# Patient Record
Sex: Female | Born: 1950 | Race: White | Hispanic: No | Marital: Married | Smoking: Never smoker
Health system: Southern US, Community
[De-identification: ages and names within clinical notes are randomized; demographics above are authoritative.]

## PROBLEM LIST (undated history)

## (undated) DIAGNOSIS — S149XXA Injury of unspecified nerves of neck, initial encounter: Secondary | ICD-10-CM

## (undated) DIAGNOSIS — Z8051 Family history of malignant neoplasm of kidney: Secondary | ICD-10-CM

## (undated) DIAGNOSIS — Z803 Family history of malignant neoplasm of breast: Secondary | ICD-10-CM

## (undated) DIAGNOSIS — Z801 Family history of malignant neoplasm of trachea, bronchus and lung: Secondary | ICD-10-CM

## (undated) DIAGNOSIS — C801 Malignant (primary) neoplasm, unspecified: Secondary | ICD-10-CM

## (undated) DIAGNOSIS — I719 Aortic aneurysm of unspecified site, without rupture: Secondary | ICD-10-CM

## (undated) DIAGNOSIS — N950 Postmenopausal bleeding: Secondary | ICD-10-CM

## (undated) DIAGNOSIS — A64 Unspecified sexually transmitted disease: Secondary | ICD-10-CM

## (undated) DIAGNOSIS — A6920 Lyme disease, unspecified: Secondary | ICD-10-CM

## (undated) DIAGNOSIS — I10 Essential (primary) hypertension: Secondary | ICD-10-CM

## (undated) DIAGNOSIS — M810 Age-related osteoporosis without current pathological fracture: Secondary | ICD-10-CM

## (undated) DIAGNOSIS — J189 Pneumonia, unspecified organism: Secondary | ICD-10-CM

## (undated) DIAGNOSIS — S3992XA Unspecified injury of lower back, initial encounter: Secondary | ICD-10-CM

## (undated) DIAGNOSIS — M1990 Unspecified osteoarthritis, unspecified site: Secondary | ICD-10-CM

## (undated) DIAGNOSIS — R32 Unspecified urinary incontinence: Secondary | ICD-10-CM

## (undated) DIAGNOSIS — N809 Endometriosis, unspecified: Secondary | ICD-10-CM

## (undated) HISTORY — DX: Lyme disease, unspecified: A69.20

## (undated) HISTORY — DX: Unspecified urinary incontinence: R32

## (undated) HISTORY — DX: Family history of malignant neoplasm of kidney: Z80.51

## (undated) HISTORY — DX: Malignant (primary) neoplasm, unspecified: C80.1

## (undated) HISTORY — DX: Age-related osteoporosis without current pathological fracture: M81.0

## (undated) HISTORY — DX: Unspecified injury of lower back, initial encounter: S39.92XA

## (undated) HISTORY — DX: Injury of unspecified nerves of neck, initial encounter: S14.9XXA

## (undated) HISTORY — PX: CATARACT EXTRACTION, BILATERAL: SHX1313

## (undated) HISTORY — PX: LASIK: SHX215

## (undated) HISTORY — DX: Unspecified sexually transmitted disease: A64

## (undated) HISTORY — PX: EYE SURGERY: SHX253

## (undated) HISTORY — DX: Family history of malignant neoplasm of trachea, bronchus and lung: Z80.1

## (undated) HISTORY — DX: Postmenopausal bleeding: N95.0

## (undated) HISTORY — DX: Family history of malignant neoplasm of breast: Z80.3

## (undated) HISTORY — DX: Endometriosis, unspecified: N80.9

---

## 1984-01-25 HISTORY — PX: PELVIC LAPAROSCOPY: SHX162

## 1984-12-24 HISTORY — PX: OOPHORECTOMY: SHX86

## 1992-01-25 DIAGNOSIS — S3992XA Unspecified injury of lower back, initial encounter: Secondary | ICD-10-CM

## 1992-01-25 HISTORY — DX: Unspecified injury of lower back, initial encounter: S39.92XA

## 1997-07-18 ENCOUNTER — Other Ambulatory Visit: Admission: RE | Admit: 1997-07-18 | Discharge: 1997-07-18 | Payer: Self-pay | Admitting: Obstetrics and Gynecology

## 1998-04-15 ENCOUNTER — Emergency Department (HOSPITAL_COMMUNITY): Admission: EM | Admit: 1998-04-15 | Discharge: 1998-04-15 | Payer: Self-pay | Admitting: Emergency Medicine

## 1998-04-15 ENCOUNTER — Encounter: Payer: Self-pay | Admitting: Emergency Medicine

## 1998-04-16 ENCOUNTER — Encounter: Payer: Self-pay | Admitting: Emergency Medicine

## 1998-10-07 ENCOUNTER — Other Ambulatory Visit: Admission: RE | Admit: 1998-10-07 | Discharge: 1998-10-07 | Payer: Self-pay | Admitting: Obstetrics and Gynecology

## 1999-11-10 ENCOUNTER — Other Ambulatory Visit: Admission: RE | Admit: 1999-11-10 | Discharge: 1999-11-10 | Payer: Self-pay | Admitting: Obstetrics and Gynecology

## 2000-09-05 ENCOUNTER — Emergency Department (HOSPITAL_COMMUNITY): Admission: EM | Admit: 2000-09-05 | Discharge: 2000-09-05 | Payer: Self-pay

## 2000-10-06 ENCOUNTER — Encounter: Admission: RE | Admit: 2000-10-06 | Discharge: 2000-10-06 | Payer: Self-pay | Admitting: Geriatric Medicine

## 2000-10-06 ENCOUNTER — Encounter: Payer: Self-pay | Admitting: Geriatric Medicine

## 2000-11-10 ENCOUNTER — Other Ambulatory Visit: Admission: RE | Admit: 2000-11-10 | Discharge: 2000-11-10 | Payer: Self-pay | Admitting: Obstetrics and Gynecology

## 2001-11-12 ENCOUNTER — Other Ambulatory Visit: Admission: RE | Admit: 2001-11-12 | Discharge: 2001-11-12 | Payer: Self-pay | Admitting: Obstetrics and Gynecology

## 2002-12-11 ENCOUNTER — Other Ambulatory Visit: Admission: RE | Admit: 2002-12-11 | Discharge: 2002-12-11 | Payer: Self-pay | Admitting: Obstetrics and Gynecology

## 2004-01-20 ENCOUNTER — Other Ambulatory Visit: Admission: RE | Admit: 2004-01-20 | Discharge: 2004-01-20 | Payer: Self-pay | Admitting: Obstetrics and Gynecology

## 2004-01-25 LAB — HM COLONOSCOPY

## 2005-10-21 ENCOUNTER — Other Ambulatory Visit: Admission: RE | Admit: 2005-10-21 | Discharge: 2005-10-21 | Payer: Self-pay | Admitting: Obstetrics and Gynecology

## 2007-01-17 ENCOUNTER — Other Ambulatory Visit: Admission: RE | Admit: 2007-01-17 | Discharge: 2007-01-17 | Payer: Self-pay | Admitting: Obstetrics and Gynecology

## 2008-04-16 ENCOUNTER — Other Ambulatory Visit: Admission: RE | Admit: 2008-04-16 | Discharge: 2008-04-16 | Payer: Self-pay | Admitting: Obstetrics and Gynecology

## 2008-07-23 HISTORY — PX: BREAST SURGERY: SHX581

## 2008-12-02 ENCOUNTER — Encounter: Payer: Self-pay | Admitting: Sports Medicine

## 2008-12-25 ENCOUNTER — Ambulatory Visit: Payer: Self-pay | Admitting: Sports Medicine

## 2008-12-25 DIAGNOSIS — M25569 Pain in unspecified knee: Secondary | ICD-10-CM | POA: Insufficient documentation

## 2008-12-25 DIAGNOSIS — M216X9 Other acquired deformities of unspecified foot: Secondary | ICD-10-CM | POA: Insufficient documentation

## 2009-04-22 LAB — HM PAP SMEAR: HM Pap smear: NEGATIVE

## 2012-05-04 ENCOUNTER — Telehealth: Payer: Self-pay | Admitting: Obstetrics and Gynecology

## 2012-05-04 NOTE — Telephone Encounter (Signed)
Pt would like nurse to give her a call. She is not sure if she needs an appt.

## 2012-05-04 NOTE — Telephone Encounter (Signed)
PATIENT WILL USE OTC FOR WEEK END EXCEPT ON Sunday AND WILL CALL FOR APPT. ON Monday IF NO IMPROVEMENT. SEU

## 2012-05-04 NOTE — Telephone Encounter (Signed)
PATIENT IS HAVING VAGINAL ITCHING . CONCERN IS A YEAST INFECTION. DECLINES VAGINAL DISCHARGE, BURNING. STATES HAS NOT TRIED ANY OTC MEDS. STATES SHE IS PRONE TO FUNGUS INFECTIONS. PLEASE ADVISE. SUE.

## 2012-05-04 NOTE — Telephone Encounter (Signed)
Yes, ov please

## 2012-05-21 ENCOUNTER — Other Ambulatory Visit: Payer: Self-pay | Admitting: Obstetrics and Gynecology

## 2012-05-22 NOTE — Telephone Encounter (Signed)
eScribe request for refill on PROMETRIUM Last filled - 05/16/11, #30 X 1 year Last AEX - 05/16/11 Next AEX - 06/04/12 Please advise refills.  Chart in your door.

## 2012-05-27 ENCOUNTER — Other Ambulatory Visit: Payer: Self-pay | Admitting: Obstetrics and Gynecology

## 2012-05-28 NOTE — Telephone Encounter (Signed)
eScribe request for refill on [facility] Last filled - 05/16/11, #3 X 3 Last AEX - 05/16/11 Next AEX - 06/04/12 One month supply sent to pharmacy.

## 2012-05-31 ENCOUNTER — Encounter: Payer: Self-pay | Admitting: Obstetrics and Gynecology

## 2012-06-04 ENCOUNTER — Encounter: Payer: Self-pay | Admitting: Obstetrics and Gynecology

## 2012-06-04 ENCOUNTER — Ambulatory Visit (INDEPENDENT_AMBULATORY_CARE_PROVIDER_SITE_OTHER): Payer: BC Managed Care – PPO | Admitting: Obstetrics and Gynecology

## 2012-06-04 VITALS — BP 100/56 | Ht 66.25 in | Wt 144.0 lb

## 2012-06-04 DIAGNOSIS — Z202 Contact with and (suspected) exposure to infections with a predominantly sexual mode of transmission: Secondary | ICD-10-CM

## 2012-06-04 DIAGNOSIS — Z2089 Contact with and (suspected) exposure to other communicable diseases: Secondary | ICD-10-CM

## 2012-06-04 DIAGNOSIS — N951 Menopausal and female climacteric states: Secondary | ICD-10-CM

## 2012-06-04 DIAGNOSIS — Z0000 Encounter for general adult medical examination without abnormal findings: Secondary | ICD-10-CM

## 2012-06-04 DIAGNOSIS — Z01419 Encounter for gynecological examination (general) (routine) without abnormal findings: Secondary | ICD-10-CM

## 2012-06-04 LAB — POCT URINALYSIS DIPSTICK
Bilirubin, UA: NEGATIVE
Blood, UA: NEGATIVE
Glucose, UA: NEGATIVE
Nitrite, UA: NEGATIVE

## 2012-06-04 MED ORDER — ESTRADIOL ACETATE 0.05 MG/24HR VA RING: VAGINAL_RING | VAGINAL | Status: DC

## 2012-06-04 MED ORDER — PROGESTERONE MICRONIZED 100 MG PO CAPS: ORAL_CAPSULE | ORAL | Status: DC

## 2012-06-04 MED ORDER — VITAMIN D (ERGOCALCIFEROL) 1.25 MG (50000 UNIT) PO CAPS: 50000.0000 [IU] | ORAL_CAPSULE | ORAL | Status: DC

## 2012-06-04 MED ORDER — CLOBETASOL PROPIONATE 0.05 % EX OINT: TOPICAL_OINTMENT | Freq: Two times a day (BID) | CUTANEOUS | Status: DC

## 2012-06-04 MED ORDER — ESTROGENS, CONJUGATED 0.625 MG/GM VA CREA: TOPICAL_CREAM | Freq: Every day | VAGINAL | Status: DC

## 2012-06-04 NOTE — Addendum Note (Signed)
Addended by: Alison Murray on: 06/04/2012 03:49 PM   Modules accepted: Orders

## 2012-06-04 NOTE — Progress Notes (Signed)
62 y.o.  Married  Caucasian female   G1P1 here for annual exam.  Marital discord, pt wants STD screen and test for HSV because she and husband thought between them they had HSV but neither had been tested because it didn't really make a difference between them which partner had it.  Also having some vulvar itching, used monistat 7 and a cream Dr. Venancio Poisson gave her for yeast infection on other body parts and still feels itching.  Also tried a vag moisturizer without relief.  No new sex partner.  ,   Patient's last menstrual period was 09/24/2005.          Sexually active: yes  The current method of family planning is post menopausal status.    Exercising: walking 5-7 days a week Last mammogram:  08/24/11 normal Last pap smear: 04/22/09 neg History of abnormal pap: no Smoking: no Alcohol: 1 glass of wine weekly Last colonoscopy:2006 normal repeat in 10years Last Bone Density:  01/24/10 osteopenia Last tetanus shot: Dr Pete Glatter not sure when Last cholesterol check: 2013 normal  Hgb:      13.9          Urine:neg    Health Maintenance  Topic Date Due  . Tetanus/tdap  10/14/1969  . Zostavax  10/15/2010  . Pap Smear  04/22/2012  . Influenza Vaccine  09/24/2012  . Mammogram  08/23/2013  . Colonoscopy  01/24/2014    Family History  Problem Relation Age of Onset  . Osteoporosis Paternal Grandmother   . Breast cancer Paternal Grandmother   . Hypertension Mother   . COPD Mother   . Hyperlipidemia Mother   . Cancer Father 47    kidney cancer  . Osteoporosis Maternal Grandfather     Patient Active Problem List   Diagnosis Date Noted  . 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 Osteoporosis   . Endometriosis   . Urinary incontinence   . STD (sexually transmitted disease)     HSV  . Back injury 1994    back/neck injury     Past Surgical History  Procedure Laterality Date  . Pelvic  laparoscopy  1986    endometriosis  . Lasik    . Oophorectomy  12/86    Allergies: Penicillins  Current Outpatient Prescriptions  Medication Sig Dispense Refill  . Ascorbic Acid (VITAMIN C PO) Take 500 mg by mouth 2 (two) times daily.       . B Complex Vitamins (B COMPLEX PO) Take by mouth daily.      . Calcium Carbonate-Vitamin D (CALCIUM-D PO) Take 600 mg by mouth 2 (two) times daily.      . carisoprodol (SOMA) 350 MG tablet       . FEMRING 0.05 MG/24HR RING insert 1 vaginally every 3 months  1 each  0  . glucosamine-chondroitin 500-400 MG tablet Take 1 tablet by mouth daily.      . Horsetail, Equisetum Arvense, POWD by Does not apply route.      . Multiple Vitamins-Minerals (MULTIVITAMIN WITH MINERALS) tablet Take 1 tablet by mouth daily.      . progesterone (PROMETRIUM) 100 MG capsule take 1 tablet by mouth once daily  30 capsule  11  . tretinoin microspheres (RETIN-A MICRO) 0.1 % gel       . Vitamin D, Ergocalciferol, (DRISDOL) 50000 UNITS CAPS Take 50,000 Units by mouth every 14 (fourteen) days.  No current facility-administered medications for this visit.    ROS: Pertinent items are noted in HPI.  Social Hx: Married, one daughter Bland Span a pt here Is a professor at [employer]. Marital discord;  Husband in poor mental state.  Pt wants STD screen "just in case"    Exam:    BP 100/56  Ht 5' 6.25" (1.683 m)  Wt 144 lb (65.318 kg)  BMI 23.06 kg/m2  LMP 09/24/2005  Down 8 pounds since last year.  Height stable Wt Readings from Last 3 Encounters:  06/04/12 144 lb (65.318 kg)  12/25/08 160 lb (72.576 kg)     Ht Readings from Last 3 Encounters:  06/04/12 5' 6.25" (1.683 m)  12/25/08 5\' 6"  (1.676 m)    General appearance: alert, cooperative and appears stated age Head: Normocephalic, without obvious abnormality, atraumatic Neck: no adenopathy, supple, symmetrical, trachea midline and thyroid not enlarged, symmetric, no tenderness/mass/nodules Lungs: clear to auscultation  bilaterally Breasts: Inspection negative, No nipple retraction or dimpling, No nipple discharge or bleeding, No axillary or supraclavicular adenopathy, Normal to palpation without dominant masses Heart: regular rate and rhythm Abdomen: soft, non-tender; bowel sounds normal; no masses,  no organomegaly Extremities: extremities normal, atraumatic, no cyanosis or edema Skin: Skin color, texture, turgor normal. No rashes or lesions Lymph nodes: Cervical, supraclavicular, and axillary nodes normal. No abnormal inguinal nodes palpated Neurologic: Grossly normal   Pelvic: External genitalia:  no lesions, no redness or irritation              Urethra:  normal appearing urethra with no masses, tenderness or lesions              Bartholins and Skenes: normal                 Vagina: normal appearing vagina with normal color and discharge, no lesions, no evidence of infection              Cervix: normal appearance              Pap taken: yes        Bimanual Exam:  Uterus:  uterus is normal size, shape, consistency and nontender                                      Adnexa: normal adnexa in size, nontender and no masses                                      Rectovaginal: Confirms                                      Anus:  normal sphincter tone, no lesions  A: normal menopausal exam, on HRT     H/o osteoporosis     Vulvar itching, probably dermatitis     Vag dryness, wants tx     Pt requests STD check     H/o endometiosis on laparoscopy in 1986     P: mammogram pap smear counseled on breast self exam, mammography screening, osteoporosis, adequate intake of calcium and vitamin D, diet and exercise return annually or prn     An After Visit Summary was printed and given to the patient.

## 2012-06-04 NOTE — Patient Instructions (Signed)

## 2012-06-04 NOTE — Addendum Note (Signed)
Addended by: Alison Murray on: 06/04/2012 02:53 PM   Modules accepted: Level of Service

## 2012-06-05 LAB — HEMOGLOBIN, FINGERSTICK: Hemoglobin, fingerstick: 13.9 g/dL (ref 12.0–16.0)

## 2012-06-05 LAB — STD PANEL: HIV: NONREACTIVE

## 2012-06-05 LAB — HSV(HERPES SIMPLEX VRS) I + II AB-IGG
HSV 1 Glycoprotein G Ab, IgG: <0.1 IV
HSV 2 Glycoprotein G Ab, IgG: 8.36 IV — ABNORMAL HIGH

## 2012-06-05 LAB — GC/CHLAMYDIA PROBE AMP, URINE: Chlamydia, Swab/Urine, PCR: NEGATIVE

## 2012-06-06 LAB — IPS PAP TEST WITH HPV

## 2012-07-09 ENCOUNTER — Other Ambulatory Visit: Payer: Self-pay | Admitting: Obstetrics and Gynecology

## 2012-10-09 ENCOUNTER — Other Ambulatory Visit: Payer: Self-pay | Admitting: Obstetrics and Gynecology

## 2012-10-09 ENCOUNTER — Telehealth: Payer: Self-pay | Admitting: Obstetrics and Gynecology

## 2012-10-09 NOTE — Telephone Encounter (Signed)
LMTCB not sure why patient was called. cm

## 2012-10-09 NOTE — Telephone Encounter (Signed)
Patient asking for a refill on Clobetasol Oint 0.05% apply twice daily, Last AEX 06/04/12 Rx was given For 60gms w/ 0 refills.  Ok to refill?

## 2012-10-09 NOTE — Telephone Encounter (Signed)
Patient said she missed your call. Please leave message. Not sure who called no phone message.

## 2012-10-10 ENCOUNTER — Other Ambulatory Visit: Payer: Self-pay | Admitting: Obstetrics and Gynecology

## 2012-10-10 MED ORDER — CLOBETASOL PROPIONATE 0.05 % EX OINT: TOPICAL_OINTMENT | CUTANEOUS | Status: DC

## 2012-10-10 NOTE — Telephone Encounter (Signed)
Refill sent to pharmacy by me.

## 2012-10-10 NOTE — Telephone Encounter (Signed)
Left message on mobile voicemail "Maureen Figueroa" that refill request was processed by Dr. Edward Jolly and sent [facility] [place].

## 2012-11-21 ENCOUNTER — Other Ambulatory Visit: Payer: Self-pay | Admitting: Obstetrics and Gynecology

## 2012-11-21 NOTE — Telephone Encounter (Signed)
eScribe request for refill on [facility] Last filled - 06/04/12, X 1 Last AEX - 06/04/12 Next AEX - 06/05/13 RX sent.

## 2013-05-13 ENCOUNTER — Encounter: Payer: Self-pay | Admitting: Obstetrics and Gynecology

## 2013-06-05 ENCOUNTER — Ambulatory Visit: Payer: BC Managed Care – PPO | Admitting: Obstetrics and Gynecology

## 2013-06-08 ENCOUNTER — Other Ambulatory Visit: Payer: Self-pay | Admitting: Obstetrics & Gynecology

## 2013-06-10 NOTE — Telephone Encounter (Signed)
Last AEX and refill 06/04/12 #30/ 11 refills MMG: 09/2012 BI-RADS 2: Benign. Next appt: 06/21/13  Will refill once until appt 06/21/13.

## 2013-06-14 ENCOUNTER — Other Ambulatory Visit: Payer: Self-pay | Admitting: *Deleted

## 2013-06-14 MED ORDER — ESTRADIOL ACETATE 0.05 MG/24HR VA RING: VAGINAL_RING | VAGINAL | Status: DC

## 2013-06-14 MED ORDER — ESTROGENS, CONJUGATED 0.625 MG/GM VA CREA: TOPICAL_CREAM | VAGINAL | Status: DC

## 2013-06-14 NOTE — Telephone Encounter (Signed)
Last AEX 06/04/12 Last refill premarin 06/04/12 #30g/ 6 refills Femring #1/ 2 refills Next appt 06/21/13 MMG 09/2012 BI-RADS 2  Rxs sent until appt.

## 2013-06-21 ENCOUNTER — Ambulatory Visit (INDEPENDENT_AMBULATORY_CARE_PROVIDER_SITE_OTHER): Payer: BC Managed Care – PPO | Admitting: Obstetrics and Gynecology

## 2013-06-21 ENCOUNTER — Encounter: Payer: Self-pay | Admitting: Obstetrics and Gynecology

## 2013-06-21 VITALS — BP 100/74 | HR 64 | Ht 66.0 in | Wt 138.2 lb

## 2013-06-21 DIAGNOSIS — Z0000 Encounter for general adult medical examination without abnormal findings: Secondary | ICD-10-CM

## 2013-06-21 DIAGNOSIS — E559 Vitamin D deficiency, unspecified: Secondary | ICD-10-CM

## 2013-06-21 DIAGNOSIS — Z01419 Encounter for gynecological examination (general) (routine) without abnormal findings: Secondary | ICD-10-CM

## 2013-06-21 LAB — HEMOGLOBIN, FINGERSTICK: HEMOGLOBIN, FINGERSTICK: 13 g/dL (ref 12.0–16.0)

## 2013-06-21 MED ORDER — ESTROGENS, CONJUGATED 0.625 MG/GM VA CREA: TOPICAL_CREAM | VAGINAL | Status: DC

## 2013-06-21 MED ORDER — PROGESTERONE MICRONIZED 100 MG PO CAPS: ORAL_CAPSULE | ORAL | Status: DC

## 2013-06-21 MED ORDER — ESTRADIOL ACETATE 0.05 MG/24HR VA RING: VAGINAL_RING | VAGINAL | Status: DC

## 2013-06-21 NOTE — Progress Notes (Signed)
Patient ID: Maureen Figueroa, female   DOB: 08-Nov-1950, 63 y.o.   MRN: [id number] GYNECOLOGY VISIT  PCP:   Lajean Manes, MD  Referring provider:   HPI: 63 y.o.   Married  Caucasian  female   G1P1 with Patient's last menstrual period was 09/24/2005.   here for  AEX.  Taking Femring and Prometrium and Premarin cream. Feels good.  History of osteoporosis. Was on Fosamax and bone density went back up to almost normal.   Some vulvar/crural fold itching.  Some burning.   Valtrex for HSV II prophlpylaxis. Used clobetasol for reduction of shedding to viral particles.   Wants Vit D level check.   Hgb:     PCP Urine:  ---  GYNECOLOGIC HISTORY: Patient's last menstrual period was 09/24/2005. Sexually active:  yes Partner preference: female Contraception: postmenopausal   Menopausal hormone therapy: Femring, Premarin cream, Prometrium DES exposure:   no Blood transfusions:   no Sexually transmitted diseases:  HSV   GYN procedures and prior surgeries:  Diagnostic laparoscopy 1986 ?left ovary removed. Last mammogram: 10-09-12 KDX:IPJAS                Last pap and high risk HPV testing: 06-04-12 wnl:neg HR HPV   History of abnormal pap smear:  no   OB History   Grav Para Term Preterm Abortions TAB SAB Ect Mult Living   1 1        1        LIFESTYLE: Exercise:    Planks daily & dancing           Tobacco:   no Alcohol:    2 drinks per week Drug use:  no  OTHER HEALTH MAINTENANCE: Tetanus/TDap:  ? Up to date with PCP Gardisil:             n/a Influenza:           2013 Zostavax:           no  Bone density:    Completed with PCP but unsure of last date:hx osteoporosis - 3 year ago.  Colonoscopy:     2006 with [facility]: normal.  Next one due 2016.  Cholesterol check:   06/2012 wnl  Family History  Problem Relation Age of Onset  . Osteoporosis Paternal Grandmother   . Breast cancer Paternal Grandmother   . Hypertension Mother   . COPD Mother   . Hyperlipidemia Mother   .  Cancer Father 26    kidney cancer  . Osteoporosis Maternal Grandfather     Patient Active Problem List   Diagnosis Date Noted  . 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 Osteoporosis   . Endometriosis   . Urinary incontinence   . STD (sexually transmitted disease)     HSV  . Back injury 1994    back/neck injury     Past Surgical History  Procedure Laterality Date  . Pelvic laparoscopy  1986    endometriosis  . Lasik    . Oophorectomy  12/86    ALLERGIES: Penicillins  Current Outpatient Prescriptions  Medication Sig Dispense Refill  . Ascorbic Acid (VITAMIN C PO) Take 500 mg by mouth 2 (two) times daily.       . B Complex Vitamins (B COMPLEX PO) Take by mouth daily.      . Calcium Carbonate-Vitamin D (CALCIUM-D PO) Take 600 mg by mouth 2 (two) times daily.      Marland Kitchen  carisoprodol (SOMA) 350 MG tablet       . clobetasol ointment (TEMOVATE) 0.05 % apply topically twice a day  60 g  0  . conjugated estrogens (PREMARIN) vaginal cream Use 1/2 g vaginally 2-3 times per week  30 g  0  . Estradiol Acetate (FEMRING) 0.05 MG/24HR RING Insert 1 vaginally every 3 months.  1 each  0  . glucosamine-chondroitin 500-400 MG tablet Take 1 tablet by mouth daily.      . Horsetail, Equisetum Arvense, POWD by Does not apply route.      . Multiple Vitamins-Minerals (MULTIVITAMIN WITH MINERALS) tablet Take 1 tablet by mouth daily.      . progesterone (PROMETRIUM) 100 MG capsule take 1 tablet by mouth once daily  30 capsule  11  . tretinoin microspheres (RETIN-A MICRO) 0.1 % gel       . valACYclovir (VALTREX) 1000 MG tablet Take 1 tablet by mouth daily.      . Vitamin D, Ergocalciferol, (DRISDOL) 50000 UNITS CAPS Take 1 capsule (50,000 Units total) by mouth every 14 (fourteen) days.  30 capsule  1   No current facility-administered medications for this visit.     ROS:  Pertinent items are noted in HPI.  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   PHYSICAL EXAMINATION:    BP 100/74  Pulse 64  Ht 5\' 6"  (1.676 m)  Wt 138 lb 3.2 oz (62.687 kg)  BMI 22.32 kg/m2  LMP 09/24/2005   Wt Readings from Last 3 Encounters:  06/21/13 138 lb 3.2 oz (62.687 kg)  06/04/12 144 lb (65.318 kg)  12/25/08 160 lb (72.576 kg)     Ht Readings from Last 3 Encounters:  06/21/13 5\' 6"  (1.676 m)  06/04/12 5' 6.25" (1.683 m)  12/25/08 5\' 6"  (1.676 m)    General appearance: alert, cooperative and appears stated age Head: Normocephalic, without obvious abnormality, atraumatic Neck: no adenopathy, supple, symmetrical, trachea midline and thyroid not enlarged, symmetric, no tenderness/mass/nodules Lungs: clear to auscultation bilaterally Breasts: Inspection negative, No nipple retraction or dimpling, No nipple discharge or bleeding, No axillary or supraclavicular adenopathy, Normal to palpation without dominant masses Heart: regular rate and rhythm Abdomen: soft, non-tender; no masses,  no organomegaly Extremities: extremities normal, atraumatic, no cyanosis or edema Skin: Skin color, texture, turgor normal. No rashes or lesions Lymph nodes: Cervical, supraclavicular, and axillary nodes normal. No abnormal inguinal nodes palpated Neurologic: Grossly normal  Pelvic: External genitalia:  no lesions              Urethra:  normal appearing urethra with no masses, tenderness or lesions              Bartholins and Skenes: normal                 Vagina: normal appearing vagina with normal color and discharge, no lesions              Cervix: normal appearance              Pap and high risk HPV testing done: no.            Bimanual Exam:  Uterus:  uterus is normal size, shape, consistency and nontender                                      Adnexa: normal adnexa in size, nontender and no masses  Rectovaginal: Confirms                                      Anus:  normal sphincter tone,  no lesions  ASSESSMENT  Normal gynecologic exam. HRT patient.  History of HSV II. Osteopenia.   PLAN  Mammogram recommended yearly.  Pap smear and high risk HPV testing not indicated.  Refill of Prometrium, FemRing, and Premarin cream for one year.  See Epic orders. Discussion regarding benefits and risks of HRT including MI, stroke, DVT, PE, and breast cancer.  Check Vit D level.  Discussion regarding HSV. Counseled on self breast exam, Calcium and vitamin D intake, exercise. Return annually or prn   An After Visit Summary was printed and given to the patient.

## 2013-06-21 NOTE — Patient Instructions (Signed)

## 2013-06-22 LAB — VITAMIN D 25 HYDROXY (VIT D DEFICIENCY, FRACTURES): Vit D, 25-Hydroxy: 56 ng/mL (ref 30–89)

## 2013-10-07 ENCOUNTER — Other Ambulatory Visit: Payer: Self-pay | Admitting: Obstetrics and Gynecology

## 2013-10-07 NOTE — Telephone Encounter (Signed)
Last AEX: 06/21/13 Last refill:10/10/12 #60g X 0 Current AEX:07/03/14  Please advise

## 2013-11-19 ENCOUNTER — Telehealth: Payer: Self-pay | Admitting: Obstetrics and Gynecology

## 2013-11-19 NOTE — Telephone Encounter (Signed)
Left message upcoming June appointment has been canceled and needs to be rescheduled.

## 2013-11-25 ENCOUNTER — Encounter: Payer: Self-pay | Admitting: Obstetrics and Gynecology

## 2013-12-09 ENCOUNTER — Other Ambulatory Visit: Payer: Self-pay

## 2013-12-09 NOTE — Telephone Encounter (Signed)
Incoming Refill Request from [facility]:[id number] 100mg   Last AEX:06/21/13 Last Refill:06/21/13 #30 X 11 Next AEX:NS Last MMG:10/09/12 Bi-Rads Benign  Pt is requesting a 90 day supply per [employer]. Is this ok?

## 2013-12-23 ENCOUNTER — Telehealth: Payer: Self-pay | Admitting: *Deleted

## 2013-12-23 MED ORDER — PROGESTERONE MICRONIZED 100 MG PO CAPS: ORAL_CAPSULE | ORAL | Status: DC

## 2013-12-23 NOTE — Telephone Encounter (Signed)
Fax From: [facility] for Progesterone 100 mg capsules Last Refilled: 06/21/13 #30/11 rfs Aex Scheduled: No AEX sheduled for 2016  Progesterone 100 mg #90/1 rfs sent to [facility] to last patient until AEX.  Fax faxed back to [facility] stating this.  Routed to provider for review, encounter closed.

## 2013-12-24 DIAGNOSIS — G589 Mononeuropathy, unspecified: Secondary | ICD-10-CM

## 2013-12-24 HISTORY — DX: Mononeuropathy, unspecified: G58.9

## 2013-12-31 ENCOUNTER — Observation Stay (HOSPITAL_COMMUNITY)
Admission: EM | Admit: 2013-12-31 | Discharge: 2014-01-01 | Disposition: A | Discharge status: Home / Self Care | Payer: BC Managed Care – PPO | Attending: Internal Medicine | Admitting: Internal Medicine

## 2013-12-31 ENCOUNTER — Encounter (HOSPITAL_COMMUNITY): Payer: Self-pay | Admitting: Emergency Medicine

## 2013-12-31 ENCOUNTER — Emergency Department (HOSPITAL_COMMUNITY): Payer: BC Managed Care – PPO

## 2013-12-31 ENCOUNTER — Observation Stay (HOSPITAL_COMMUNITY): Payer: BC Managed Care – PPO

## 2013-12-31 DIAGNOSIS — R200 Anesthesia of skin: Principal | ICD-10-CM | POA: Diagnosis present

## 2013-12-31 DIAGNOSIS — Z880 Allergy status to penicillin: Secondary | ICD-10-CM | POA: Insufficient documentation

## 2013-12-31 DIAGNOSIS — Z79899 Other long term (current) drug therapy: Secondary | ICD-10-CM | POA: Diagnosis not present

## 2013-12-31 DIAGNOSIS — N809 Endometriosis, unspecified: Secondary | ICD-10-CM | POA: Diagnosis not present

## 2013-12-31 DIAGNOSIS — M810 Age-related osteoporosis without current pathological fracture: Secondary | ICD-10-CM | POA: Diagnosis not present

## 2013-12-31 DIAGNOSIS — G459 Transient cerebral ischemic attack, unspecified: Secondary | ICD-10-CM | POA: Insufficient documentation

## 2013-12-31 DIAGNOSIS — R202 Paresthesia of skin: Secondary | ICD-10-CM | POA: Diagnosis not present

## 2013-12-31 DIAGNOSIS — E876 Hypokalemia: Secondary | ICD-10-CM

## 2013-12-31 DIAGNOSIS — R42 Dizziness and giddiness: Secondary | ICD-10-CM

## 2013-12-31 DIAGNOSIS — F1210 Cannabis abuse, uncomplicated: Secondary | ICD-10-CM | POA: Diagnosis not present

## 2013-12-31 LAB — COMPREHENSIVE METABOLIC PANEL
ALT: 23 U/L (ref 0–35)
ANION GAP: 13 (ref 5–15)
AST: 16 U/L (ref 0–37)
Albumin: 4.1 g/dL (ref 3.5–5.2)
Alkaline Phosphatase: 76 U/L (ref 39–117)
BUN: 13 mg/dL (ref 6–23)
CALCIUM: 9.2 mg/dL (ref 8.4–10.5)
CO2: 26 mEq/L (ref 19–32)
Chloride: 102 mEq/L (ref 96–112)
Creatinine, Ser: 0.6 mg/dL (ref 0.50–1.10)
GFR calc non Af Amer: >90 mL/min (ref >90)
GLUCOSE: 91 mg/dL (ref 70–99)
Potassium: 3.5 mEq/L — ABNORMAL LOW (ref 3.7–5.3)
SODIUM: 141 meq/L (ref 137–147)
TOTAL PROTEIN: 6.8 g/dL (ref 6.0–8.3)
Total Bilirubin: 0.9 mg/dL (ref 0.3–1.2)

## 2013-12-31 LAB — URINALYSIS, ROUTINE W REFLEX MICROSCOPIC
BILIRUBIN URINE: NEGATIVE
GLUCOSE, UA: NEGATIVE mg/dL
Hgb urine dipstick: NEGATIVE
Ketones, ur: NEGATIVE mg/dL
Leukocytes, UA: NEGATIVE
Nitrite: NEGATIVE
PH: 6 (ref 5.0–8.0)
Protein, ur: NEGATIVE mg/dL
Specific Gravity, Urine: 1.007 (ref 1.005–1.030)
Urobilinogen, UA: 0.2 mg/dL (ref 0.0–1.0)

## 2013-12-31 LAB — DIFFERENTIAL
Basophils Absolute: 0 10*3/uL (ref 0.0–0.1)
Basophils Relative: 1 % (ref 0–1)
EOS ABS: 0.1 10*3/uL (ref 0.0–0.7)
EOS PCT: 2 % (ref 0–5)
LYMPHS ABS: 2.9 10*3/uL (ref 0.7–4.0)
Lymphocytes Relative: 34 % (ref 12–46)
Monocytes Absolute: 0.5 10*3/uL (ref 0.1–1.0)
Monocytes Relative: 6 % (ref 3–12)
Neutro Abs: 4.9 10*3/uL (ref 1.7–7.7)
Neutrophils Relative %: 57 % (ref 43–77)

## 2013-12-31 LAB — I-STAT CHEM 8, ED
BUN: 13 mg/dL (ref 6–23)
Calcium, Ion: 1.13 mmol/L (ref 1.13–1.30)
Chloride: 100 mEq/L (ref 96–112)
Creatinine, Ser: 0.6 mg/dL (ref 0.50–1.10)
Glucose, Bld: 93 mg/dL (ref 70–99)
HCT: 43 % (ref 36.0–46.0)
HEMOGLOBIN: 14.6 g/dL (ref 12.0–15.0)
Potassium: 3.3 mEq/L — ABNORMAL LOW (ref 3.7–5.3)
SODIUM: 141 meq/L (ref 137–147)
TCO2: 24 mmol/L (ref 0–100)

## 2013-12-31 LAB — CBC
HCT: 38.7 % (ref 36.0–46.0)
Hemoglobin: 13 g/dL (ref 12.0–15.0)
MCH: 33.1 pg (ref 26.0–34.0)
MCHC: 33.6 g/dL (ref 30.0–36.0)
MCV: 98.5 fL (ref 78.0–100.0)
PLATELETS: 261 10*3/uL (ref 150–400)
RBC: 3.93 MIL/uL (ref 3.87–5.11)
RDW: 12.7 % (ref 11.5–15.5)
WBC: 8.5 10*3/uL (ref 4.0–10.5)

## 2013-12-31 LAB — RAPID URINE DRUG SCREEN, HOSP PERFORMED
Amphetamines: NOT DETECTED
BARBITURATES: NOT DETECTED
BENZODIAZEPINES: NOT DETECTED
Cocaine: NOT DETECTED
Opiates: NOT DETECTED
Tetrahydrocannabinol: POSITIVE — AB

## 2013-12-31 LAB — TSH: TSH: 1.5 u[IU]/mL (ref 0.350–4.500)

## 2013-12-31 LAB — ETHANOL

## 2013-12-31 LAB — APTT: aPTT: 31 seconds (ref 24–37)

## 2013-12-31 LAB — I-STAT TROPONIN, ED: TROPONIN I, POC: 0 ng/mL (ref 0.00–0.08)

## 2013-12-31 LAB — PROTIME-INR
INR: 0.98 (ref 0.00–1.49)
Prothrombin Time: 13.1 seconds (ref 11.6–15.2)

## 2013-12-31 MED ORDER — SIMVASTATIN 40 MG PO TABS
40.0000 mg | ORAL_TABLET | Freq: Every day | ORAL | Status: DC
Start: 2014-01-01 — End: 2014-01-01

## 2013-12-31 NOTE — ED Provider Notes (Signed)
CSN: [id number]     Arrival date & time 12/31/13  2008 History   First MD Initiated Contact with Patient 12/31/13 2030     Chief Complaint  Patient presents with  . Dizziness  . Numbness     (Consider location/radiation/quality/duration/timing/severity/associated sxs/prior Treatment) HPI Comments: 63 year old female who presents with chief complaint of right arm and face numbness. She states that approximately 3 hours ago she had an episode of dizziness while she was on her way to her office. At some undetermined time after that, she developed right arm and right face numbness. She denied weakness. She denied facial drooping, drooling, difficulty swallowing, or difficulty with speech. On arrival to the emergency department, her symptoms have begun to improve. At time of initial evaluation, her symptoms were approximately 25% of what they were at the maximum.  Of note, she reports that a few days ago, she had similar symptoms of right-sided body numbness which included her leg, arm, and face. This lasted a short period of time prior to resolving completely.  Patient is a 63 y.o. female presenting with neurologic complaint.  Neurologic Problem This is a new problem. Episode onset: approximately 3 hours ago. The problem occurs constantly. The problem has been gradually improving. Pertinent negatives include no chest pain, no abdominal pain and no shortness of breath. Nothing aggravates the symptoms. Nothing relieves the symptoms.    Past Medical History  Diagnosis Date  . Osteoporosis   . Endometriosis   . Urinary incontinence   . STD (sexually transmitted disease)     HSV  . Back injury 1994    back/neck injury    Past Surgical History  Procedure Laterality Date  . Pelvic laparoscopy  1986    endometriosis  . Lasik    . Oophorectomy  12/86   Family History  Problem Relation Age of Onset  . Osteoporosis Paternal Grandmother   . Breast cancer Paternal Grandmother   . Hypertension  Mother   . COPD Mother   . Hyperlipidemia Mother   . Cancer Father 79    kidney cancer  . Osteoporosis Maternal Grandfather    History  Substance Use Topics  . Smoking status: Never Smoker   . Smokeless tobacco: Never Used  . Alcohol Use: 1.0 oz/week    2 drink(s) per week     Comment: occ wine or alcohol   OB History    Gravida Para Term Preterm AB TAB SAB Ectopic Multiple Living   1 1        1      Review of Systems  Respiratory: Negative for shortness of breath.   Cardiovascular: Negative for chest pain.  Gastrointestinal: Negative for abdominal pain.  All other systems reviewed and are negative.     Allergies  Chocolate and Penicillins  Home Medications   Prior to Admission medications   Medication Sig Start Date End Date Taking? Authorizing Provider  Ascorbic Acid (VITAMIN C PO) Take 500 mg by mouth daily.    Yes Historical Provider, MD  B Complex Vitamins (B COMPLEX PO) Take by mouth daily.   Yes Historical Provider, MD  Calcium Carbonate-Vitamin D (CALCIUM-D PO) Take 600 mg by mouth 2 (two) times daily.   Yes Historical Provider, MD  carisoprodol (SOMA) 350 MG tablet Take 350 mg by mouth daily as needed for muscle spasms.  05/07/12  Yes Historical Provider, MD  conjugated estrogens (PREMARIN) vaginal cream Use 1/2 g vaginally 2-3 times per week 06/21/13  Yes Brook E Amundson de  Berton Lan, MD  DM-Phenylephrine-Acetaminophen (TYLENOL COLD MULTI-SYMPTOM DAY) 10-5-325 MG TABS Take 2 tablets by mouth daily as needed (for cold).   Yes Historical Provider, MD  Estradiol Acetate (FEMRING) 0.05 MG/24HR RING Insert 1 vaginally every 3 months. 06/21/13  Yes [place], MD  Multiple Vitamins-Minerals (MULTIVITAMIN WITH MINERALS) tablet Take 1 tablet by mouth daily.   Yes Historical Provider, MD  progesterone (PROMETRIUM) 100 MG capsule take 1 tablet by mouth once daily Patient taking differently: Take 100 mg by mouth daily. take 1 tablet by mouth  once daily 12/23/13  Yes Brook E Amundson de Berton Lan, MD  tretinoin microspheres (RETIN-A MICRO) 0.1 % gel Apply 1 application topically at bedtime.  05/18/12  Yes Historical Provider, MD  valACYclovir (VALTREX) 1000 MG tablet Take 1,000 mg by mouth at bedtime.  04/04/13  Yes Historical Provider, MD  Vitamin D, Ergocalciferol, (DRISDOL) 50000 UNITS CAPS Take 1 capsule (50,000 Units total) by mouth every 14 (fourteen) days. 06/04/12  Yes Lubertha South Romine, MD  clobetasol ointment (TEMOVATE) 0.05 % apply topically twice a day 10/07/13   Brook E Amundson de Berton Lan, MD   BP 154/79 mmHg  Pulse 74  Temp(Src) 98.4 F (36.9 C) (Oral)  Resp 17  SpO2 99%  LMP 09/24/2005 Physical Exam  Constitutional: She is oriented to person, place, and time. She appears well-developed and well-nourished. No distress.  HENT:  Head: Normocephalic and atraumatic.  Mouth/Throat: Oropharynx is clear and moist.  Eyes: Conjunctivae are normal. Pupils are equal, round, and reactive to light. No scleral icterus.  Neck: Neck supple.  Cardiovascular: Normal rate, regular rhythm, normal heart sounds and intact distal pulses.   No murmur heard. Pulmonary/Chest: Effort normal and breath sounds normal. No stridor. No respiratory distress. She has no rales.  Abdominal: Soft. Bowel sounds are normal. She exhibits no distension. There is no tenderness.  Musculoskeletal: Normal range of motion.  Neurological: She is alert and oriented to person, place, and time. She has normal strength. No cranial nerve deficit or sensory deficit. Coordination and gait normal. GCS eye subscore is 4. GCS verbal subscore is 5. GCS motor subscore is 6.  Normal bilateral upper extremity sensation to sharp and soft touch.  Skin: Skin is warm and dry. No rash noted.  Psychiatric: She has a normal mood and affect. Her behavior is normal.  Nursing note and vitals reviewed.   ED Course  Procedures (including critical care time) Labs  Review Labs Reviewed  COMPREHENSIVE METABOLIC PANEL - Abnormal; Notable for the following:    Potassium 3.5 (*)    All other components within normal limits  URINE RAPID DRUG SCREEN (HOSP PERFORMED) - Abnormal; Notable for the following:    Tetrahydrocannabinol POSITIVE (*)    All other components within normal limits  I-STAT CHEM 8, ED - Abnormal; Notable for the following:    Potassium 3.3 (*)    All other components within normal limits  ETHANOL  PROTIME-INR  APTT  CBC  DIFFERENTIAL  URINALYSIS, ROUTINE W REFLEX MICROSCOPIC  LIPID PANEL  SEDIMENTATION RATE  VITAMIN B12  ANA  TSH  RPR  I-STAT TROPOININ, ED  I-STAT TROPOININ, ED    Imaging Review Ct Head Wo Contrast  12/31/2013   CLINICAL DATA:  Numbness and tingling on RIGHT while walking today, last seen normal at 1730 hr, had weakness 2 days ago  EXAM: CT HEAD WITHOUT CONTRAST  TECHNIQUE: Contiguous axial images were obtained from the  base of the skull through the vertex without intravenous contrast.  COMPARISON:  None  FINDINGS: Streak artifacts at skull base.  Normal ventricular morphology.  No midline shift or mass effect.  Normal appearance of brain parenchyma.  No intracranial hemorrhage, mass lesion, or acute infarction.  Visualized paranasal sinuses and mastoid air cells clear.  Bones unremarkable.  IMPRESSION: No acute intracranial abnormalities.  Findings called to Dr. Leonel Ramsay on 12/31/2013 at 2045 hr.   Electronically Signed   By: Lavonia Dana M.D.   On: 12/31/2013 20:45  All radiology studies independently viewed by me.      EKG Interpretation   Date/Time:  Tuesday December 31 2013 20:16:08 EST Ventricular Rate:  60 PR Interval:  138 QRS Duration: 66 QT Interval:  436 QTC Calculation: 436 R Axis:   59 Text Interpretation:  Normal sinus rhythm Normal ECG No significant change  was found Confirmed by [facility]  MD, TREY ([id number]) on 12/31/2013 10:48:27 PM      MDM   Final diagnoses:  Numbness and tingling     63 year old female presenting as a code stroke secondary to right sided body numbness. Symptoms improving, but not resolved at time of evaluation. CT of her head was negative. Neurology consulted and recommends admission, stroke workup. TPA not given secondary to resolving and mild symptoms. Discussed with internal medicine who will admit.    Artis Delay, MD 12/31/13 [id number]

## 2013-12-31 NOTE — Significant Event (Signed)
Rapid Response Event Note  Overview:   Pt arrived in ED via POV with complaints of right sided facial, right arm and right leg numbness that started around 1715 this pm. Code Stroke called at 2020 from ED triage.    Initial Focused Assessment: On arrival to CT 2, pt was wheeled in via wheelchair and was able to stand and pivot to CT table, pt is A&Ox4, no weakness, no pain. After CT pt was taken to POD A-7 and NIH exam done with a score of 1 for decreased sensation in her right hand. Plan is to admit to hospitalist for observation.  Interventions:  None  Event Summary:   at   2020 code stroke called for right sided numbness, CT done with negative results, NIH score 1 for right hand numbness, other symptoms resolving. Plan is to admit to hospitalist for TIA and observation.  Marland Kitchen  at          [place]

## 2013-12-31 NOTE — ED Notes (Signed)
Patient transported to X-ray 

## 2013-12-31 NOTE — ED Notes (Signed)
Dr Doy Mince in triage for exam at this time.

## 2013-12-31 NOTE — Consult Note (Signed)
Neurology Consultation Reason for Consult: Right-sided numbness Referring Physician: Doy Mince, T  CC: Right-sided numbness  History is obtained from: Patient  HPI: Maureen Figueroa is a 63 y.o. female presents with right-sided numbness that is improving, though not resolved. She states that she had a similar episode 2 days ago, but resolved completely at baseline. Currently she states that around 5 PM she had lightheadedness and subsequently became numb throughout her right side. She states that over the past few hours, and has been improving but does not resolve completely.   LKW: 5 PM tpa given?: no, mild symptoms    ROS: A 14 point ROS was performed and is negative except as noted in the HPI.   Past Medical History  Diagnosis Date  . Osteoporosis   . Endometriosis   . Urinary incontinence   . STD (sexually transmitted disease)     HSV  . Back injury 1994    back/neck injury     Family History: No history of similar  Social History: Tob: Denies  Exam: Current vital signs: BP 152/66 mmHg  Pulse 76  Temp(Src) 98.4 F (36.9 C) (Oral)  Resp 12  SpO2 97%  LMP 09/24/2005 Vital signs in last 24 hours: Temp:  [98.4 F (36.9 C)] 98.4 F (36.9 C) (12/08 2057) Pulse Rate:  [60-76] 76 (12/08 2230) Resp:  [12-18] 12 (12/08 2230) BP: (130-154)/(62-81) 152/66 mmHg (12/08 2230) SpO2:  [94 %-99 %] 97 % (12/08 2230)  General: In bed, NAD  Physical Exam  Constitutional: Appears well-developed and well-nourished.  Psych: Affect appropriate to situation Eyes: No scleral injection HENT: No OP obstrucion Head: Normocephalic.  Cardiovascular: Normal rate and regular rhythm.  Respiratory: Effort normal and breath sounds normal to anterior ascultation GI: Soft.  No distension. There is no tenderness.  Skin: WDI  Neuro: Mental Status: Patient is awake, alert, oriented to person, place, month, year, and situation. Immediate and remote memory are intact. Patient is able to  give a clear and coherent history. No signs of aphasia or neglect Cranial Nerves: II: Visual Fields are full. Pupils are equal, round, and reactive to light.   III,IV, VI: EOMI without ptosis or diploplia.  V: Facial sensation is symmetric to temperature VII: Facial movement is symmetric.  VIII: hearing is intact to voice X: Uvula elevates symmetrically XI: Shoulder shrug is symmetric. XII: tongue is midline without atrophy or fasciculations.  Motor: Tone is normal. Bulk is normal. 5/5 strength was present in all four extremities.  Sensory: Sensation is intact to pinprick with the exception of the distal right arm. Deep Tendon Reflexes: 2+ and symmetric in the biceps and patellae.  Cerebellar: FNF and HKS are intact bilaterally    I have reviewed labs in epic and the results pertinent to this consultation are: Chem-8 unremarkable  I have reviewed the images obtained: CT head-negative  Impression: 63 year old female with recurrent episode of right-sided numbness with improvement, though with some persistent symptoms. I suspect that she has had a small ischemic stroke, TIA is also possible. In this time I would favor admission for risk factor evaluation and modification.  Recommendations: 1. HgbA1c, fasting lipid panel 2. MRI, MRA  of the brain without contrast 3. Frequent neuro checks 4. Echocardiogram 5. Carotid dopplers 6. Prophylactic therapy-Antiplatelet med: Aspirin - dose 325mg  PO or 300mg  PR 7. Risk factor modification 8. Telemetry monitoring  Roland Rack, MD [facility] [phone]  If 7pm- 7am, please page neurology on call as listed in [place].

## 2013-12-31 NOTE — ED Notes (Signed)
Patient with right facial, arm and leg numbness that started around 1700 tonight when walking to meet a student.  She did have dizziness with it that has resolved.  Patient is CAOx3, no facial droop, no slurred speech.  Hand grips and feet pushes equal.

## 2013-12-31 NOTE — ED Notes (Signed)
Report attempted, RN to call back. 

## 2013-12-31 NOTE — H&P (Signed)
Maureen Figueroa is an 63 y.o. female.    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  Pt had slight dizziness and feeling of near syncope.   Pt feels better now.  Denies headache, vision change, cp, palp, sob, n/v, diarrhea, brbpr, black stool, dysuria, hematuria. Pt had CT brain which was unremarkable.  Pt will be admitted for w/up of dizziness, numbness, tingling possibly TIA.  Pt notes that she took aspirin prior to coming to the Ed.   Past Medical History  Diagnosis Date  . Osteoporosis   . Endometriosis   . Urinary incontinence   . STD (sexually transmitted disease)     HSV  . Back injury 1994    back/neck injury     Past Surgical History  Procedure Laterality Date  . Pelvic laparoscopy  1986    endometriosis  . Lasik    . Oophorectomy  12/86    Family History  Problem Relation Age of Onset  . Osteoporosis Paternal Grandmother   . Breast cancer Paternal Grandmother   . Hypertension Mother   . COPD Mother   . Hyperlipidemia Mother   . Cancer Father 2    kidney cancer  . Osteoporosis Maternal Grandfather    Social History:  reports that she has never smoked. She has never used smokeless tobacco. She reports that she drinks about 1.0 oz of alcohol per week. She reports that she does not use illicit drugs.  Allergies:  Allergies  Allergen Reactions  . Chocolate Swelling  . Penicillins Other (See Comments)    unknown     (Not in a hospital admission)  Results for orders placed or performed during the hospital encounter of 12/31/13 (from the past 48 hour(s))  Ethanol     Status: None   Collection Time: 12/31/13  8:36 PM  Result Value Ref Range   Alcohol, Ethyl (B) <11 0 - 11 mg/dL    Comment:        LOWEST DETECTABLE LIMIT FOR SERUM ALCOHOL IS 11  mg/dL FOR MEDICAL PURPOSES ONLY   Protime-INR     Status: None   Collection Time: 12/31/13  8:36 PM  Result Value Ref Range   Prothrombin Time 13.1 11.6 - 15.2 seconds   INR 0.98 0.00 - 1.49  APTT     Status: None   Collection Time: 12/31/13  8:36 PM  Result Value Ref Range   aPTT 31 24 - 37 seconds  CBC     Status: None   Collection Time: 12/31/13  8:36 PM  Result Value Ref Range   WBC 8.5 4.0 - 10.5 K/uL   RBC 3.93 3.87 - 5.11 MIL/uL   Hemoglobin 13.0 12.0 - 15.0 g/dL   HCT 38.7 36.0 - 46.0 %   MCV 98.5 78.0 - 100.0 fL   MCH 33.1 26.0 - 34.0 pg   MCHC 33.6 30.0 - 36.0 g/dL   RDW 12.7 11.5 - 15.5 %   Platelets 261 150 - 400 K/uL  Differential     Status: None   Collection Time: 12/31/13  8:36 PM  Result Value Ref Range   Neutrophils Relative % 57 43 - 77 %   Neutro Abs 4.9 1.7 - 7.7 K/uL   Lymphocytes Relative 34 12 - 46 %   Lymphs  Abs 2.9 0.7 - 4.0 K/uL   Monocytes Relative 6 3 - 12 %   Monocytes Absolute 0.5 0.1 - 1.0 K/uL   Eosinophils Relative 2 0 - 5 %   Eosinophils Absolute 0.1 0.0 - 0.7 K/uL   Basophils Relative 1 0 - 1 %   Basophils Absolute 0.0 0.0 - 0.1 K/uL  Comprehensive metabolic panel     Status: Abnormal   Collection Time: 12/31/13  8:36 PM  Result Value Ref Range   Sodium 141 137 - 147 mEq/L   Potassium 3.5 (L) 3.7 - 5.3 mEq/L   Chloride 102 96 - 112 mEq/L   CO2 26 19 - 32 mEq/L   Glucose, Bld 91 70 - 99 mg/dL   BUN 13 6 - 23 mg/dL   Creatinine, Ser 0.60 0.50 - 1.10 mg/dL   Calcium 9.2 8.4 - 10.5 mg/dL   Total Protein 6.8 6.0 - 8.3 g/dL   Albumin 4.1 3.5 - 5.2 g/dL   AST 16 0 - 37 U/L   ALT 23 0 - 35 U/L   Alkaline Phosphatase 76 39 - 117 U/L   Total Bilirubin 0.9 0.3 - 1.2 mg/dL   GFR calc non Af Amer >90 >90 mL/min   GFR calc Af Amer >90 >90 mL/min    Comment: (NOTE) The eGFR has been calculated using the CKD EPI equation. This calculation has not been validated in all clinical situations. eGFR's persistently <90 mL/min signify possible  Chronic Kidney Disease.    Anion gap 13 5 - 15  I-Stat Troponin, ED (not at [facility])     Status: None   Collection Time: 12/31/13  8:46 PM  Result Value Ref Range   Troponin i, poc 0.00 0.00 - 0.08 ng/mL   Comment 3            Comment: Due to the release kinetics of cTnI, a negative result within the first hours of the onset of symptoms does not rule out myocardial infarction with certainty. If myocardial infarction is still suspected, repeat the test at appropriate intervals.   I-Stat Chem 8, ED     Status: Abnormal   Collection Time: 12/31/13  8:48 PM  Result Value Ref Range   Sodium 141 137 - 147 mEq/L   Potassium 3.3 (L) 3.7 - 5.3 mEq/L   Chloride 100 96 - 112 mEq/L   BUN 13 6 - 23 mg/dL   Creatinine, Ser 0.60 0.50 - 1.10 mg/dL   Glucose, Bld 93 70 - 99 mg/dL   Calcium, Ion 1.13 1.13 - 1.30 mmol/L   TCO2 24 0 - 100 mmol/L   Hemoglobin 14.6 12.0 - 15.0 g/dL   HCT 43.0 36.0 - 46.0 %  Urine Drug Screen     Status: Abnormal   Collection Time: 12/31/13  8:55 PM  Result Value Ref Range   Opiates NONE DETECTED NONE DETECTED   Cocaine NONE DETECTED NONE DETECTED   Benzodiazepines NONE DETECTED NONE DETECTED   Amphetamines NONE DETECTED NONE DETECTED   Tetrahydrocannabinol POSITIVE (A) NONE DETECTED   Barbiturates NONE DETECTED NONE DETECTED    Comment:        DRUG SCREEN FOR MEDICAL PURPOSES ONLY.  IF CONFIRMATION IS NEEDED FOR ANY PURPOSE, NOTIFY LAB WITHIN 5 DAYS.        LOWEST DETECTABLE LIMITS FOR URINE DRUG SCREEN Drug Class       Cutoff (ng/mL) Amphetamine      1000 Barbiturate      200 Benzodiazepine  762 Tricyclics       263 Opiates          300 Cocaine          300 THC              50   Urinalysis, Routine w reflex microscopic     Status: None   Collection Time: 12/31/13  8:55 PM  Result Value Ref Range   Color, Urine YELLOW YELLOW   APPearance CLEAR CLEAR   Specific Gravity, Urine 1.007 1.005 - 1.030   pH 6.0 5.0 - 8.0   Glucose, UA NEGATIVE NEGATIVE  mg/dL   Hgb urine dipstick NEGATIVE NEGATIVE   Bilirubin Urine NEGATIVE NEGATIVE   Ketones, ur NEGATIVE NEGATIVE mg/dL   Protein, ur NEGATIVE NEGATIVE mg/dL   Urobilinogen, UA 0.2 0.0 - 1.0 mg/dL   Nitrite NEGATIVE NEGATIVE   Leukocytes, UA NEGATIVE NEGATIVE    Comment: MICROSCOPIC NOT DONE ON URINES WITH NEGATIVE PROTEIN, BLOOD, LEUKOCYTES, NITRITE, OR GLUCOSE <1000 mg/dL.   Ct Head Wo Contrast  12/31/2013   CLINICAL DATA:  Numbness and tingling on RIGHT while walking today, last seen normal at 1730 hr, had weakness 2 days ago  EXAM: CT HEAD WITHOUT CONTRAST  TECHNIQUE: Contiguous axial images were obtained from the base of the skull through the vertex without intravenous contrast.  COMPARISON:  None  FINDINGS: Streak artifacts at skull base.  Normal ventricular morphology.  No midline shift or mass effect.  Normal appearance of brain parenchyma.  No intracranial hemorrhage, mass lesion, or acute infarction.  Visualized paranasal sinuses and mastoid air cells clear.  Bones unremarkable.  IMPRESSION: No acute intracranial abnormalities.  Findings called to Dr. Leonel Ramsay on 12/31/2013 at 2045 hr.   Electronically Signed   By: Lavonia Dana M.D.   On: 12/31/2013 20:45    Review of Systems  Constitutional: Negative for fever, chills, weight loss, malaise/fatigue and diaphoresis.  HENT: Negative for congestion, ear discharge, ear pain, hearing loss, nosebleeds, sore throat and tinnitus.   Eyes: Negative for blurred vision, double vision, photophobia, pain, discharge and redness.  Respiratory: Negative for cough, hemoptysis, sputum production, shortness of breath, wheezing and stridor.   Cardiovascular: Negative for chest pain, palpitations, orthopnea, claudication, leg swelling and PND.  Gastrointestinal: Negative for heartburn, nausea, vomiting, abdominal pain, diarrhea, constipation, blood in stool and melena.  Genitourinary: Negative for dysuria, urgency, frequency, hematuria and flank pain.   Musculoskeletal: Negative for myalgias, back pain, joint pain, falls and neck pain.  Skin: Negative for itching and rash.  Neurological: Positive for dizziness. Negative for tingling, tremors, sensory change, speech change, focal weakness, seizures, loss of consciousness, weakness and headaches.  Endo/Heme/Allergies: Negative for environmental allergies and polydipsia. Does not bruise/bleed easily.  Psychiatric/Behavioral: Negative for depression, suicidal ideas, hallucinations, memory loss and substance abuse. The patient is not nervous/anxious and does not have insomnia.     Blood pressure 139/81, pulse 60, temperature 98.4 F (36.9 C), temperature source Oral, resp. rate 18, last menstrual period 09/24/2005, SpO2 96 %. Physical Exam  Constitutional: She is oriented to person, place, and time. She appears well-developed and well-nourished.  HENT:  Head: Normocephalic and atraumatic.  Eyes: Conjunctivae and EOM are normal. Pupils are equal, round, and reactive to light. Right eye exhibits no discharge. Left eye exhibits no discharge.  Neck: Normal range of motion. Neck supple. No JVD present. No tracheal deviation present. No thyromegaly present.  Cardiovascular: Normal rate and regular rhythm.  Exam reveals no gallop.  No murmur heard. Respiratory: Effort normal and breath sounds normal. No stridor. No respiratory distress. She has no wheezes. She has no rales. She exhibits no tenderness.  GI: Soft. Bowel sounds are normal. She exhibits no distension. There is no tenderness. There is no rebound and no guarding.  Musculoskeletal: Normal range of motion. She exhibits no edema or tenderness.  Lymphadenopathy:    She has no cervical adenopathy.  Neurological: She is alert and oriented to person, place, and time. She has normal reflexes. She displays normal reflexes. No cranial nerve deficit. She exhibits normal muscle tone. Coordination normal.  Skin: Skin is warm and dry. No rash noted. No  erythema. No pallor.  Psychiatric: She has a normal mood and affect. Her behavior is normal.     Assessment/Plan Dizziness, numbness, tingling  ? TIA.  Pt will be placed on telemetry Check MRI brain  Check carotid ultrasound Check cardiac echo Check cmp, lipid, tsh, esr, rpr ana, CXR Pt will be started on aspirin and simvastatin  Hypokalemia Replete potassium and check cmp in am      Jani Gravel 12/31/2013, 10:27 PM

## 2014-01-01 ENCOUNTER — Observation Stay (HOSPITAL_COMMUNITY): Payer: BC Managed Care – PPO

## 2014-01-01 DIAGNOSIS — R42 Dizziness and giddiness: Secondary | ICD-10-CM

## 2014-01-01 DIAGNOSIS — R200 Anesthesia of skin: Secondary | ICD-10-CM

## 2014-01-01 DIAGNOSIS — I059 Rheumatic mitral valve disease, unspecified: Secondary | ICD-10-CM

## 2014-01-01 LAB — COMPREHENSIVE METABOLIC PANEL
ALBUMIN: 3.4 g/dL — AB (ref 3.5–5.2)
ALT: 19 U/L (ref 0–35)
ANION GAP: 11 (ref 5–15)
AST: 12 U/L (ref 0–37)
Alkaline Phosphatase: 66 U/L (ref 39–117)
BUN: 11 mg/dL (ref 6–23)
CALCIUM: 8.7 mg/dL (ref 8.4–10.5)
CO2: 26 mEq/L (ref 19–32)
CREATININE: 0.62 mg/dL (ref 0.50–1.10)
Chloride: 108 mEq/L (ref 96–112)
GFR calc Af Amer: >90 mL/min (ref >90)
GFR calc non Af Amer: >90 mL/min (ref >90)
Glucose, Bld: 90 mg/dL (ref 70–99)
Potassium: 4.3 mEq/L (ref 3.7–5.3)
Sodium: 145 mEq/L (ref 137–147)
Total Bilirubin: 1.2 mg/dL (ref 0.3–1.2)
Total Protein: 6 g/dL (ref 6.0–8.3)

## 2014-01-01 LAB — HEMOGLOBIN A1C
HEMOGLOBIN A1C: 5.4 % (ref <5.7)
MEAN PLASMA GLUCOSE: 108 mg/dL (ref <117)

## 2014-01-01 LAB — LIPID PANEL
CHOL/HDL RATIO: 2 ratio
Cholesterol: 190 mg/dL (ref 0–200)
HDL: 97 mg/dL (ref >39)
LDL CALC: 85 mg/dL (ref 0–99)
Triglycerides: 41 mg/dL (ref <150)
VLDL: 8 mg/dL (ref 0–40)

## 2014-01-01 LAB — VITAMIN B12: VITAMIN B 12: 1272 pg/mL — AB (ref 211–911)

## 2014-01-01 LAB — GLUCOSE, CAPILLARY
GLUCOSE-CAPILLARY: 144 mg/dL — AB (ref 70–99)
GLUCOSE-CAPILLARY: 72 mg/dL (ref 70–99)

## 2014-01-01 LAB — SEDIMENTATION RATE: Sed Rate: 1 mm/hr (ref 0–22)

## 2014-01-01 LAB — RPR

## 2014-01-01 LAB — TROPONIN I
Troponin I: <0.3 ng/mL (ref <0.30)
Troponin I: <0.3 ng/mL (ref <0.30)

## 2014-01-01 MED ORDER — VITAMIN D (ERGOCALCIFEROL) 1.25 MG (50000 UNIT) PO CAPS: 50000.0000 [IU] | ORAL_CAPSULE | ORAL | Status: DC

## 2014-01-01 MED ORDER — ASPIRIN 325 MG PO TABS
325.0000 mg | ORAL_TABLET | Freq: Every day | ORAL | Status: DC
  Administered 2014-01-01: 325 mg via ORAL
  Filled 2014-01-01: qty 1

## 2014-01-01 MED ORDER — VITAMIN C 500 MG PO TABS: 500.0000 mg | ORAL_TABLET | Freq: Every day | ORAL | Status: DC

## 2014-01-01 MED ORDER — POTASSIUM CHLORIDE IN NACL 20-0.9 MEQ/L-% IV SOLN
INTRAVENOUS | Status: AC
  Administered 2014-01-01: 01:00:00 via INTRAVENOUS
  Filled 2014-01-01: qty 1000

## 2014-01-01 MED ORDER — SODIUM CHLORIDE 0.9 % IJ SOLN
3.0000 mL | Freq: Two times a day (BID) | INTRAMUSCULAR | Status: DC
  Administered 2014-01-01 (×2): 3 mL via INTRAVENOUS

## 2014-01-01 MED ORDER — VALACYCLOVIR HCL 500 MG PO TABS
1000.0000 mg | ORAL_TABLET | Freq: Every day | ORAL | Status: DC
  Administered 2014-01-01: 1000 mg via ORAL
  Filled 2014-01-01: qty 2

## 2014-01-01 MED ORDER — PROGESTERONE MICRONIZED 100 MG PO CAPS
100.0000 mg | ORAL_CAPSULE | Freq: Every day | ORAL | Status: DC
  Administered 2014-01-01: 100 mg via ORAL
  Filled 2014-01-01 (×3): qty 1

## 2014-01-01 MED ORDER — STROKE: EARLY STAGES OF RECOVERY BOOK
Freq: Once | Status: DC
  Filled 2014-01-01: qty 1

## 2014-01-01 MED ORDER — SODIUM CHLORIDE 0.9 % IV SOLN: INTRAVENOUS | Status: DC

## 2014-01-01 MED ORDER — B COMPLEX-C PO TABS
1.0000 | ORAL_TABLET | Freq: Every day | ORAL | Status: DC
  Filled 2014-01-01: qty 1

## 2014-01-01 MED ORDER — ASPIRIN EC 81 MG PO TBEC: 81.0000 mg | DELAYED_RELEASE_TABLET | Freq: Every day | ORAL | Status: DC

## 2014-01-01 MED ORDER — ACETAMINOPHEN 325 MG PO TABS: 650.0000 mg | ORAL_TABLET | ORAL | Status: DC | PRN

## 2014-01-01 MED ORDER — CARISOPRODOL 350 MG PO TABS
350.0000 mg | ORAL_TABLET | Freq: Every day | ORAL | Status: DC | PRN
  Administered 2014-01-01: 350 mg via ORAL
  Filled 2014-01-01: qty 1

## 2014-01-01 MED ORDER — ESTROGENS, CONJUGATED 0.625 MG/GM VA CREA
1.0000 | TOPICAL_CREAM | VAGINAL | Status: DC
  Filled 2014-01-01: qty 30

## 2014-01-01 MED ORDER — TRETINOIN MICROSPHERE 0.1 % EX GEL: 1.0000 "application " | Freq: Every day | CUTANEOUS | Status: DC

## 2014-01-01 MED ORDER — ENOXAPARIN SODIUM 40 MG/0.4ML ~~LOC~~ SOLN
40.0000 mg | SUBCUTANEOUS | Status: DC
  Administered 2014-01-01: 40 mg via SUBCUTANEOUS
  Filled 2014-01-01: qty 0.4

## 2014-01-01 MED ORDER — ADULT MULTIVITAMIN W/MINERALS CH: 1.0000 | ORAL_TABLET | Freq: Every day | ORAL | Status: DC

## 2014-01-01 NOTE — Progress Notes (Signed)
Echo Lab  2D Echocardiogram completed.  [place], RDCS 01/01/2014 11:53 AM

## 2014-01-01 NOTE — Progress Notes (Signed)
UR completed 

## 2014-01-01 NOTE — Progress Notes (Signed)
STROKE TEAM PROGRESS NOTE   HISTORY Maureen Figueroa is a 63 y.o. female presents with right-sided numbness that is improving, though not resolved. She states that she had a similar episode 2 days ago, but resolved completely at baseline. Currently she states that around 5 PM 12/31/2013 she had lightheadedness and subsequently became numb throughout her right side. She states that over the past few hours, and has been improving but does not resolve completely. Patient was not administered TPA secondary to mild symptoms. She was admitted for further evaluation and treatment.   SUBJECTIVE (INTERVAL HISTORY) No family/friends are at the bedside.  Overall she feels her condition is almost completely resolved. "I can still feel something happened." She has been on estrogen replacement x 5 years - for vaginal dryness, menopausal symptoms.   OBJECTIVE Temp:  [98.4 F (36.9 C)-99.1 F (37.3 C)] 98.6 F (37 C) (12/09 0532) Pulse Rate:  [60-79] 65 (12/09 0430) Cardiac Rhythm:  [-] Normal sinus rhythm (12/09 0015) Resp:  [12-21] 18 (12/09 0430) BP: (104-154)/(58-95) 109/71 mmHg (12/09 0430) SpO2:  [94 %-99 %] 98 % (12/09 0430) Weight:  [63.231 kg (139 lb 6.4 oz)] 63.231 kg (139 lb 6.4 oz) (12/09 0430)   Recent Labs Lab 12/31/13 2039 01/01/14 0012  GLUCAP 72 144*    Recent Labs Lab 12/31/13 2036 12/31/13 2048 01/01/14 0521  NA 141 141 145  K 3.5* 3.3* 4.3  CL 102 100 108  CO2 26  --  26  GLUCOSE 91 93 90  BUN 13 13 11   CREATININE 0.60 0.60 0.62  CALCIUM 9.2  --  8.7    Recent Labs Lab 12/31/13 2036 01/01/14 0521  AST 16 12  ALT 23 19  ALKPHOS 76 66  BILITOT 0.9 1.2  PROT 6.8 6.0  ALBUMIN 4.1 3.4*    Recent Labs Lab 12/31/13 2036 12/31/13 2048  WBC 8.5  --   NEUTROABS 4.9  --   HGB 13.0 14.6  HCT 38.7 43.0  MCV 98.5  --   PLT 261  --     Recent Labs Lab 01/01/14 0015 01/01/14 0521  TROPONINI <0.30 <0.30    Recent Labs  12/31/13 2036  LABPROT 13.1  INR  0.98    Recent Labs  12/31/13 2055  COLORURINE YELLOW  LABSPEC 1.007  PHURINE 6.0  GLUCOSEU NEGATIVE  HGBUR NEGATIVE  BILIRUBINUR NEGATIVE  KETONESUR NEGATIVE  PROTEINUR NEGATIVE  UROBILINOGEN 0.2  NITRITE NEGATIVE  LEUKOCYTESUR NEGATIVE       Component Value Date/Time   CHOL 190 01/01/2014 0015   TRIG 41 01/01/2014 0015   HDL 97 01/01/2014 0015   CHOLHDL 2.0 01/01/2014 0015   VLDL 8 01/01/2014 0015   LDLCALC 85 01/01/2014 0015   No results found for: HGBA1C    Component Value Date/Time   LABOPIA NONE DETECTED 12/31/2013 2055   COCAINSCRNUR NONE DETECTED 12/31/2013 2055   LABBENZ NONE DETECTED 12/31/2013 2055   AMPHETMU NONE DETECTED 12/31/2013 2055   THCU POSITIVE* 12/31/2013 2055   LABBARB NONE DETECTED 12/31/2013 2055     Recent Labs Lab 12/31/13 2036  ETH <11    Dg Chest 2 View  12/31/2013   CLINICAL DATA:  Numbness and tingling on the right side of the body today.  EXAM: CHEST  2 VIEW  COMPARISON:  None.  FINDINGS: Pulmonary hyperinflation. The heart size and mediastinal contours are within normal limits. Both lungs are clear. The visualized skeletal structures are unremarkable.  IMPRESSION: No active cardiopulmonary disease.  Electronically Signed   By: Lucienne Capers M.D.   On: 12/31/2013 23:53   Ct Head Wo Contrast  12/31/2013   CLINICAL DATA:  Numbness and tingling on RIGHT while walking today, last seen normal at 1730 hr, had weakness 2 days ago  EXAM: CT HEAD WITHOUT CONTRAST  TECHNIQUE: Contiguous axial images were obtained from the base of the skull through the vertex without intravenous contrast.  COMPARISON:  None  FINDINGS: Streak artifacts at skull base.  Normal ventricular morphology.  No midline shift or mass effect.  Normal appearance of brain parenchyma.  No intracranial hemorrhage, mass lesion, or acute infarction.  Visualized paranasal sinuses and mastoid air cells clear.  Bones unremarkable.  IMPRESSION: No acute intracranial abnormalities.   Findings called to Dr. Leonel Ramsay on 12/31/2013 at 2045 hr.   Electronically Signed   By: Lavonia Dana M.D.   On: 12/31/2013 20:45     PHYSICAL EXAM Pleasant middle aged Caucasian lady not in distress.Awake alert. Afebrile. Head is nontraumatic. Neck is supple without bruit. Hearing is normal. Cardiac exam no murmur or gallop. Lungs are clear to auscultation. Distal pulses are well felt. Neurological Exam ;  Awake  Alert oriented x 3. Normal speech and language.eye movements full without nystagmus.fundi were not visualized. Vision acuity and fields appear normal. Hearing is normal. Palatal movements are normal. Face symmetric. Tongue midline. Normal strength, tone, reflexes and coordination. Normal sensation. Gait deferred.  ASSESSMENT/PLAN Maureen Figueroa is a 63 y.o. female presenting with recurrent right-sided numbness in her hand and face.  She did not receive IV t-PA due to mild symptoms.   TIA:  L brain likely from small vessel disease (hand numbness is non specific. Given accompanied face numbness, that leads [place] to a diagnosis of TIA. If hand numbness continues without facial numbness, can consider further workup as on OP which may include repeat MRI neck given prior h/o DJD per MRI 2009)  Resultant  Neuro deficits resolved.  MRI  No acute stroke  MRA No significant stenosis  MRI cervical spine done in 2002 showed arthritic changes in neck.   Carotid Doppler  pending   2D Echo  pending   LDL 85. On zocor 20 mg daily. Goal < 70 once pt has a documented stroke.  HgbA1c pending  Lovenox 40 mg sq daily for VTE prophylaxis  Diet Carb Modified thin liquids  no antithrombotics prior to admission, now on aspirin 325 mg orally every day  Patient counseled to be compliant with her antithrombotic medications at discharge  Ongoing aggressive stroke risk factor management  Therapy recommendations:  No therapy needs  Complete stroke work up. Ok for discharge once  completed  Follow up with Dr. Leonie Man in 2 months (order written)  Disposition:  Return home  Other Stroke Risk Factors  THC positive this admission  On estrogen replacement - po and vaginal cream. The less estrogen, the less risk of stroke.   Hospital day # [place] for Pager information 01/01/2014 9:48 AM  I have personally examined this patient, reviewed notes, independently viewed imaging studies, participated in medical decision making and plan of care. I have made any additions or clarifications directly to the above note. Agree with note above. Suspect left brain TIA likely due to small vessel disease. Patient counseled to quit smoking and marijuana. Finish stroke workup.  Antony Contras, MD Medical Director [facility] Pager: [phone] 01/01/2014 7:11 PM  To contact Stroke Continuity provider, please refer to [email or web address] After hours, contact General Neurology

## 2014-01-01 NOTE — Progress Notes (Signed)
[facility] PROGRESS NOTE  Assessment/Plan: Dizziness/Numbness: - HgbA1c, fasting lipid panel HDL 97, LDL < 85 - MRI, MRA of the brain without contrast pending. - PT consult, OT consult, Speech consult. - Echocardiogram and Carotid dopplers  pending - Prophylactic therapy-Antiplatelet med: Aspirin - dose 325 mg PO daily  - Avoid D5 fluids as may be harmfull - risk factor modification  - Cardiac Monitoring  - Neurochecks q4h   Hypokalemia: - repleted.     Code Status: Full Family Communication: none  Disposition Plan: inpatient   Consultants:  neuro  Procedures:  MRI  ECHo carotid  Antibiotics:  none  HPI/Subjective: Relates she still have face numbness  Objective: Filed Vitals:   01/01/14 0016 01/01/14 0230 01/01/14 0430 01/01/14 0532  BP: 139/81 104/58 109/71   Pulse: 65 68 65   Temp: 99.1 F (37.3 C) 98.7 F (37.1 C) 98.4 F (36.9 C) 98.6 F (37 C)  TempSrc: Oral Oral Oral Oral  Resp: 18 18 18    Height: 5\' 5"  (1.651 m)     Weight: 63.231 kg (139 lb 6.4 oz)  63.231 kg (139 lb 6.4 oz)   SpO2: 98% 97% 98%     Intake/Output Summary (Last 24 hours) at 01/01/14 0858 Last data filed at 01/01/14 0234  Gross per 24 hour  Intake      0 ml  Output    350 ml  Net   -350 ml   Filed Weights   01/01/14 0016 01/01/14 0430  Weight: 63.231 kg (139 lb 6.4 oz) 63.231 kg (139 lb 6.4 oz)    Exam:  General: Alert, awake, oriented x3, in no acute distress.  HEENT: No bruits, no goiter.  Heart: Regular rate and rhythm. Lungs: Good air movement, clear Abdomen: Soft, nontender, nondistended, positive bowel sounds.  Neuro: Grossly intact, nonfocal.   Data Reviewed: Basic Metabolic Panel:  Recent Labs Lab 12/31/13 2036 12/31/13 2048 01/01/14 0521  NA 141 141 145  K 3.5* 3.3* 4.3  CL 102 100 108  CO2 26  --  26  GLUCOSE 91 93 90  BUN 13 13 11   CREATININE 0.60 0.60 0.62  CALCIUM 9.2  --  8.7   Liver Function Tests:  Recent Labs Lab  12/31/13 2036 01/01/14 0521  AST 16 12  ALT 23 19  ALKPHOS 76 66  BILITOT 0.9 1.2  PROT 6.8 6.0  ALBUMIN 4.1 3.4*   No results for input(s): LIPASE, AMYLASE in the last 168 hours. No results for input(s): AMMONIA in the last 168 hours. CBC:  Recent Labs Lab 12/31/13 2036 12/31/13 2048  WBC 8.5  --   NEUTROABS 4.9  --   HGB 13.0 14.6  HCT 38.7 43.0  MCV 98.5  --   PLT 261  --    Cardiac Enzymes:  Recent Labs Lab 01/01/14 0015 01/01/14 0521  TROPONINI <0.30 <0.30   BNP (last 3 results) No results for input(s): PROBNP in the last 8760 hours. CBG:  Recent Labs Lab 01/01/14 0012  GLUCAP 144*    No results found for this or any previous visit (from the past 240 hour(s)).   Studies: Dg Chest 2 View  12/31/2013   CLINICAL DATA:  Numbness and tingling on the right side of the body today.  EXAM: CHEST  2 VIEW  COMPARISON:  None.  FINDINGS: Pulmonary hyperinflation. The heart size and mediastinal contours are within normal limits. Both lungs are clear. The visualized skeletal structures are unremarkable.  IMPRESSION: No active cardiopulmonary  disease.   Electronically Signed   By: Lucienne Capers M.D.   On: 12/31/2013 23:53   Ct Head Wo Contrast  12/31/2013   CLINICAL DATA:  Numbness and tingling on RIGHT while walking today, last seen normal at 1730 hr, had weakness 2 days ago  EXAM: CT HEAD WITHOUT CONTRAST  TECHNIQUE: Contiguous axial images were obtained from the base of the skull through the vertex without intravenous contrast.  COMPARISON:  None  FINDINGS: Streak artifacts at skull base.  Normal ventricular morphology.  No midline shift or mass effect.  Normal appearance of brain parenchyma.  No intracranial hemorrhage, mass lesion, or acute infarction.  Visualized paranasal sinuses and mastoid air cells clear.  Bones unremarkable.  IMPRESSION: No acute intracranial abnormalities.  Findings called to Dr. Leonel Ramsay on 12/31/2013 at 2045 hr.   Electronically Signed   By:  Lavonia Dana M.D.   On: 12/31/2013 20:45    Scheduled Meds: .  stroke: mapping our early stages of recovery book   Does not apply Once  . aspirin  325 mg Oral Daily  . B-complex with vitamin C  1 tablet Oral Daily  . conjugated estrogens  1 Applicatorful Vaginal QODAY  . enoxaparin (LOVENOX) injection  40 mg Subcutaneous Q24H  . multivitamin with minerals  1 tablet Oral Daily  . progesterone  100 mg Oral Daily  . simvastatin  40 mg Oral q1800  . sodium chloride  3 mL Intravenous Q12H  . valACYclovir  1,000 mg Oral QHS  . vitamin C  500 mg Oral Daily  . [START ON 01/03/2014] Vitamin D (Ergocalciferol)  50,000 Units Oral Q14 Days   Continuous Infusions: . 0.9 % NaCl with KCl 20 mEq / L 75 mL/hr at 01/01/14 0055     Charlynne Cousins  [facility] Pager [phone].  If 8PM-8AM, please contact night-coverage at [email or web address], password [facility] 01/01/2014, 8:58 AM  LOS: 1 day

## 2014-01-01 NOTE — Progress Notes (Signed)
VASCULAR LAB PRELIMINARY  PRELIMINARY  PRELIMINARY  PRELIMINARY  Carotid duplex  completed.    Preliminary report:  Bilateral:  1-39% ICA stenosis.  Vertebral artery flow is antegrade.      Harlie Buening, RVT 01/01/2014, 11:33 AM

## 2014-01-01 NOTE — Discharge Summary (Signed)
Physician Discharge Summary  RYKA BEIGHLEY MBW:[id number] DOB: 1951-01-14 DOA: 12/31/2013  PCP: Mathews Argyle, MD  Admit date: 12/31/2013 Discharge date: 01/01/2014  Time spent: 35 minutes  Recommendations for Outpatient Follow-up:  1. Follow up with neurology in 2 weeks.  Discharge Diagnoses:  Active Problems:   Dizziness   Numbness   Hypokalemia   Numbness and tingling   Discharge Condition: stable  Diet recommendation: heart healthy  Filed Weights   01/01/14 0016 01/01/14 0430  Weight: 63.231 kg (139 lb 6.4 oz) 63.231 kg (139 lb 6.4 oz)    History of present illness:  63 yo female with osteoporosis, apparently c/o numbness, tingling of the right side of her face and right arm beginning about 5-5:30pm, this evening. Pt had slight dizziness and feeling of near syncope. Pt feels better now. Denies headache, vision change, cp, palp, sob, n/v, diarrhea, brbpr, black stool, dysuria, hematuria. Pt had CT brain which was unremarkable. Pt will be admitted for w/up of dizziness, numbness, tingling possibly TIA. Pt notes that she took aspirin prior to coming to the Ed.   Hospital Course:  Dizziness/Numbness: - ? Due to TIA. - HgbA1c 5.4, fasting lipid panel HDL 97, LDL < 85, not hypoglycemic. - MRI, MRA of the brain no acute stroke. - Echocardiogram and Carotid dopplers acute findings. - Prophylactic therapy-Antiplatelet med: Aspirin - dose 81 mg PO daily  - neurology consulted and recommended follow up as an outpatient  Procedures:  MRI brain  Carotid  echo  Consultations:  neurology  Discharge Exam: Filed Vitals:   01/01/14 1203  BP: 132/69  Pulse: 74  Temp: 98.7 F (37.1 C)  Resp: 18    General: see progress note  Discharge Instructions You were cared for by a hospitalist during your hospital stay. If you have any questions about your discharge medications or the care you received while you were in the hospital after you are discharged, you  can call the unit and asked to speak with the hospitalist on call if the hospitalist that took care of you is not available. Once you are discharged, your primary care physician will handle any further medical issues. Please note that NO REFILLS for any discharge medications will be authorized once you are discharged, as it is imperative that you return to your primary care physician (or establish a relationship with a primary care physician if you do not have one) for your aftercare needs so that they can reassess your need for medications and monitor your lab values.  Discharge Instructions    Ambulatory referral to Neurology    Complete by:  As directed   Stroke patient. Dr. Leonie Man prefers follow up in 1 month     Diet - low sodium heart healthy    Complete by:  As directed      Increase activity slowly    Complete by:  As directed           Current Discharge Medication List    START taking these medications   Details  aspirin EC 81 MG tablet Take 1 tablet (81 mg total) by mouth daily.      CONTINUE these medications which have NOT CHANGED   Details  Ascorbic Acid (VITAMIN C PO) Take 500 mg by mouth daily.     B Complex Vitamins (B COMPLEX PO) Take by mouth daily.    Calcium Carbonate-Vitamin D (CALCIUM-D PO) Take 600 mg by mouth 2 (two) times daily.    carisoprodol (SOMA) 350 MG tablet  Take 350 mg by mouth daily as needed for muscle spasms.     conjugated estrogens (PREMARIN) vaginal cream Use 1/2 g vaginally 2-3 times per week Qty: 30 g, Refills: 3    DM-Phenylephrine-Acetaminophen (TYLENOL COLD MULTI-SYMPTOM DAY) 10-5-325 MG TABS Take 2 tablets by mouth daily as needed (for cold).    Estradiol Acetate (FEMRING) 0.05 MG/24HR RING Insert 1 vaginally every 3 months. Qty: 1 each, Refills: 3    Multiple Vitamins-Minerals (MULTIVITAMIN WITH MINERALS) tablet Take 1 tablet by mouth daily.    progesterone (PROMETRIUM) 100 MG capsule take 1 tablet by mouth once daily Qty: 90  capsule, Refills: 1    tretinoin microspheres (RETIN-A MICRO) 0.1 % gel Apply 1 application topically at bedtime.     valACYclovir (VALTREX) 1000 MG tablet Take 1,000 mg by mouth at bedtime.     Vitamin D, Ergocalciferol, (DRISDOL) 50000 UNITS CAPS Take 1 capsule (50,000 Units total) by mouth every 14 (fourteen) days. Qty: 30 capsule, Refills: 1    clobetasol ointment (TEMOVATE) 0.05 % apply topically twice a day Qty: 60 g, Refills: 0       Allergies  Allergen Reactions  . Chocolate Swelling  . Penicillins Other (See Comments)    unknown   Follow-up Information    Follow up with SETHI,PRAMOD, MD In 1 month.   Specialties:  Neurology, Radiology   Why:  Stroke Clinic, Office will call you with appointment date & time   Contact information:   [address] [place] [address] [phone]        The results of significant diagnostics from this hospitalization (including imaging, microbiology, ancillary and laboratory) are listed below for reference.    Significant Diagnostic Studies: Dg Chest 2 View  12/31/2013   CLINICAL DATA:  Numbness and tingling on the right side of the body today.  EXAM: CHEST  2 VIEW  COMPARISON:  None.  FINDINGS: Pulmonary hyperinflation. The heart size and mediastinal contours are within normal limits. Both lungs are clear. The visualized skeletal structures are unremarkable.  IMPRESSION: No active cardiopulmonary disease.   Electronically Signed   By: Lucienne Capers M.D.   On: 12/31/2013 23:53   Ct Head Wo Contrast  12/31/2013   CLINICAL DATA:  Numbness and tingling on RIGHT while walking today, last seen normal at 1730 hr, had weakness 2 days ago  EXAM: CT HEAD WITHOUT CONTRAST  TECHNIQUE: Contiguous axial images were obtained from the base of the skull through the vertex without intravenous contrast.  COMPARISON:  None  FINDINGS: Streak artifacts at skull base.  Normal ventricular morphology.  No midline shift or mass effect.  Normal  appearance of brain parenchyma.  No intracranial hemorrhage, mass lesion, or acute infarction.  Visualized paranasal sinuses and mastoid air cells clear.  Bones unremarkable.  IMPRESSION: No acute intracranial abnormalities.  Findings called to Dr. Leonel Ramsay on 12/31/2013 at 2045 hr.   Electronically Signed   By: Lavonia Dana M.D.   On: 12/31/2013 20:45   Mri Brain Without Contrast  01/01/2014   CLINICAL DATA:  Numbness and tingling right arm and face  EXAM: MRI HEAD WITHOUT CONTRAST  MRA HEAD WITHOUT CONTRAST  TECHNIQUE: Multiplanar, multiecho pulse sequences of the brain and surrounding structures were obtained without intravenous contrast. Angiographic images of the head were obtained using MRA technique without contrast.  COMPARISON:  CT head 12/31/2013  FINDINGS: MRI HEAD FINDINGS  Cerebral volume is normal. Ventricle size is normal. Pituitary normal in size.  9 mm  cyst adjacent to the deep lobe of the parotid on the left. This is well-circumscribed and homogeneous and appears to be a benign cyst.  Negative for acute or chronic ischemia.  Negative for hemorrhage or fluid collection.  Negative for mass or edema.  Cerebral white matter is normal. Brainstem and cerebellum are normal.  Mild mucosal edema in the paranasal sinuses.  MRA HEAD FINDINGS  Both vertebral arteries are patent to the basilar. PICA patent bilaterally. Basilar widely patent. Superior cerebellar and posterior cerebral arteries patent bilaterally.  Anterior and middle cerebral arteries are widely patent. Mild stenosis of the right M1 bifurcation. Left middle cerebral artery widely patent.  Negative for cerebral aneurysm.  IMPRESSION: No acute infarct or mass.  9 mm cyst adjacent to the deep lobe of the parotid on the left, likely a benign cyst.  No significant intracranial stenosis. Mild stenosis of the right M1 bifurcation.   Electronically Signed   By: Franchot Gallo M.D.   On: 01/01/2014 10:06   Mr Jodene Nam Head/brain Wo Cm  01/01/2014    CLINICAL DATA:  Numbness and tingling right arm and face  EXAM: MRI HEAD WITHOUT CONTRAST  MRA HEAD WITHOUT CONTRAST  TECHNIQUE: Multiplanar, multiecho pulse sequences of the brain and surrounding structures were obtained without intravenous contrast. Angiographic images of the head were obtained using MRA technique without contrast.  COMPARISON:  CT head 12/31/2013  FINDINGS: MRI HEAD FINDINGS  Cerebral volume is normal. Ventricle size is normal. Pituitary normal in size.  9 mm cyst adjacent to the deep lobe of the parotid on the left. This is well-circumscribed and homogeneous and appears to be a benign cyst.  Negative for acute or chronic ischemia.  Negative for hemorrhage or fluid collection.  Negative for mass or edema.  Cerebral white matter is normal. Brainstem and cerebellum are normal.  Mild mucosal edema in the paranasal sinuses.  MRA HEAD FINDINGS  Both vertebral arteries are patent to the basilar. PICA patent bilaterally. Basilar widely patent. Superior cerebellar and posterior cerebral arteries patent bilaterally.  Anterior and middle cerebral arteries are widely patent. Mild stenosis of the right M1 bifurcation. Left middle cerebral artery widely patent.  Negative for cerebral aneurysm.  IMPRESSION: No acute infarct or mass.  9 mm cyst adjacent to the deep lobe of the parotid on the left, likely a benign cyst.  No significant intracranial stenosis. Mild stenosis of the right M1 bifurcation.   Electronically Signed   By: Franchot Gallo M.D.   On: 01/01/2014 10:06    Microbiology: No results found for this or any previous visit (from the past 240 hour(s)).   Labs: Basic Metabolic Panel:  Recent Labs Lab 12/31/13 2036 12/31/13 2048 01/01/14 0521  NA 141 141 145  K 3.5* 3.3* 4.3  CL 102 100 108  CO2 26  --  26  GLUCOSE 91 93 90  BUN 13 13 11   CREATININE 0.60 0.60 0.62  CALCIUM 9.2  --  8.7   Liver Function Tests:  Recent Labs Lab 12/31/13 2036 01/01/14 0521  AST 16 12  ALT 23  19  ALKPHOS 76 66  BILITOT 0.9 1.2  PROT 6.8 6.0  ALBUMIN 4.1 3.4*   No results for input(s): LIPASE, AMYLASE in the last 168 hours. No results for input(s): AMMONIA in the last 168 hours. CBC:  Recent Labs Lab 12/31/13 2036 12/31/13 2048  WBC 8.5  --   NEUTROABS 4.9  --   HGB 13.0 14.6  HCT 38.7 43.0  MCV 98.5  --   PLT 261  --    Cardiac Enzymes:  Recent Labs Lab 01/01/14 0015 01/01/14 0521 01/01/14 1211  TROPONINI <0.30 <0.30 <0.30   BNP: BNP (last 3 results) No results for input(s): PROBNP in the last 8760 hours. CBG:  Recent Labs Lab 12/31/13 2039 01/01/14 0012  GLUCAP 72 144*       Signed:  Charlynne Cousins  [facility] 01/01/2014, 3:46 PM

## 2014-01-02 LAB — ANA: Anti Nuclear Antibody(ANA): NEGATIVE

## 2014-01-24 DIAGNOSIS — N950 Postmenopausal bleeding: Secondary | ICD-10-CM

## 2014-01-24 HISTORY — DX: Postmenopausal bleeding: N95.0

## 2014-07-03 ENCOUNTER — Ambulatory Visit: Payer: BC Managed Care – PPO | Admitting: Obstetrics and Gynecology

## 2014-07-22 ENCOUNTER — Other Ambulatory Visit: Payer: Self-pay | Admitting: Obstetrics and Gynecology

## 2014-07-22 NOTE — Telephone Encounter (Signed)
Medication refill request: premarin Last AEX: 06/21/13 Next AEX: 01/01/15 Last MMG (if hormonal medication request): 05/06/14 BI-RADS 2 BENIGN Refill authorized: Last rf 06/21/13 #30g/3R. Today please advise.

## 2014-07-22 NOTE — Telephone Encounter (Signed)
I am declining the two prescriptions for now, and I would like to have the patient come in to see me sooner for her annual exam.  I would like to review her health history as I see she has had some changes in her health since I last saw her for her annual exam.  TIA. Please review with nursing supervisor to find an appropriate slot for me to do her annual exam.  Thank you!

## 2014-07-24 NOTE — Telephone Encounter (Signed)
Left message to call back  

## 2014-07-25 NOTE — Telephone Encounter (Signed)
Left message to call back  

## 2014-07-29 NOTE — Telephone Encounter (Signed)
Pt was given a call x 2, no call back please advise next step.

## 2014-07-30 ENCOUNTER — Other Ambulatory Visit: Payer: Self-pay | Admitting: Obstetrics and Gynecology

## 2014-07-30 MED ORDER — CLOBETASOL PROPIONATE 0.05 % EX OINT: TOPICAL_OINTMENT | Freq: Two times a day (BID) | CUTANEOUS | Status: DC

## 2014-07-30 NOTE — Telephone Encounter (Signed)
I see that patient was seen for a TIA in December.  Please have her come in to discuss this event and her estrogen therapies.  No Rx for these at this time until I understand her cardiovascular heath.   OK to refill of the Clobetasol.

## 2014-07-30 NOTE — Telephone Encounter (Signed)
Medication refill request: Clobetasol 0.05 % ; Femring 0.05 mg Last AEX:  06/21/13 with BS Next AEX: 10/09/14 with BS Last MMG (if hormonal medication request): 05/06/14 bi-rads 2: benign Refill authorized: #60 gm ; #1 ring/0 rfs, please advise.

## 2014-07-30 NOTE — Telephone Encounter (Signed)
Patient is asking for refills of clobetasol, premarin and femring. Confirmed pharmacy with patient.

## 2014-07-31 NOTE — Telephone Encounter (Signed)
Please contact Dr. Carlyle Lipa office today to see if her approves her use of estrogen given her TIA.  We want to give good medical care to her.   Estrogen use is a risk factor for stroke, pulmonary embolus, DVT,and potential myocardial infarction.  I have reviewed her studies, but I am not able to see any notes from the neurologist as an outpatient visit.   Thank you for assisting.   [place]

## 2014-07-31 NOTE — Telephone Encounter (Signed)
Call to Dr. Carlyle Lipa office at this time. Left voicemail for Lelan Pons, Dr. Carlyle Lipa Medical Assistant. Per the voicemail, questions are answered in 24-48 hours and based on priority. Message left to request Dr. Felipa Eth review and make recommendations for continuing Femring as patient had inpatient hospitalization 12/2013 with transient cerebral ischemia. Requested return call to [facility] with recommendations provided.  [facility] as patient consult recommended follow up in two weeks, however, per reception patient is not established patient there.

## 2014-07-31 NOTE — Telephone Encounter (Signed)
Called patient to notify her of Dr. Elza Rafter recommendations. Patient says she went to the emergency room for TIA like symptoms. She said that it turned out being a pinched nerve and low potassium.  She said she had her f/u with her PCP Dr. Lajean Manes and all her labs and testing were normal. She said that he would approve of her getting her hormones. Patient says that Dr. Quincy Simmonds can contact Dr. Felipa Eth if she would like. She said she has had low blood pressure/cholesterol, weight has been in the lower end of normal. Patient says not taking the estrogen would really put her in a bad shape. Patient says she has bad endometriosis and that the femring does help her with this. Patient says she would prefer if Dr. Quincy Simmonds could refill her Femring one time, then she would come in to see Dr. Quincy Simmonds and discuss this further along with her options. Patient is scheduled for AEX 10/09/14, asked patient if she would like me to look for sooner dates, patient declined. She said this is the only time she would be able to come in due to her traveling. Patient says she is leaving town in 2 days and is really needing a refill. I told patient that I could not promise her anything but that I would send Dr. Quincy Simmonds a message and give her a call back with what she says. Patient says that she would be highly upset if this is not done.   Dr. Quincy Simmonds, please advise.

## 2014-08-01 MED ORDER — ESTROGENS, CONJUGATED 0.625 MG/GM VA CREA: TOPICAL_CREAM | VAGINAL | Status: DC

## 2014-08-01 MED ORDER — ESTRADIOL ACETATE 0.05 MG/24HR VA RING: VAGINAL_RING | VAGINAL | Status: DC

## 2014-08-01 NOTE — Telephone Encounter (Signed)
Thank you for this follow up from Dr. Felipa Eth indicating that the patient did not have a true vascular event. OK for Femring 0.05 mg, 1 per vagina for 90 days, no refills.  Premarin vaginal cream 1/2 gram pv at hs twice weekly.  Disp 42 grams, RF none.   Let her know that we are trying to provide good and safe care for her!  I will see her in the fall.   [place]

## 2014-08-01 NOTE — Telephone Encounter (Signed)
Patient returned call and expresses gratitude for Dr. Quincy Simmonds checking on her care.  She will follow up as scheduled. Routing to provider for final review. Patient agreeable to disposition. Will close encounter.

## 2014-08-01 NOTE — Telephone Encounter (Signed)
Message left to return call to [place] at [phone].  Refills placed as directed by Dr. Quincy Simmonds.

## 2014-08-07 ENCOUNTER — Ambulatory Visit: Payer: BC Managed Care – PPO | Admitting: Obstetrics and Gynecology

## 2014-09-23 ENCOUNTER — Telehealth: Payer: Self-pay | Admitting: Obstetrics and Gynecology

## 2014-09-23 NOTE — Telephone Encounter (Signed)
Patient calling to request to reschedule appointment for annual exam. She states she needs refills but has been out of town and does not have a car. She states she cannot keep her appointment for 10/09/14 due to a work meeting.   Advised patient that she will need annual exam before any refills of her HRT are given. Last annual exam was 06/21/13.  Multiple dates and times were discussed, all appointments declined that were offered for earlier appointments. Patient accepted appointment for 10/03/14 at 1030. Patient states she will double check her calendar and will call back if she cannot keep this appointment.   Routing to provider for final review. Patient agreeable to disposition. Will close encounter.

## 2014-09-23 NOTE — Telephone Encounter (Signed)
Patient wants to discuss her medication with the nurse.

## 2014-09-23 NOTE — Telephone Encounter (Signed)
Message left to return call to Kyen Taite at [phone].    

## 2014-09-30 ENCOUNTER — Other Ambulatory Visit: Payer: Self-pay | Admitting: Obstetrics and Gynecology

## 2014-09-30 NOTE — Telephone Encounter (Signed)
Medication refill request: Premarin  Last AEX:  06-21-13  Next AEX: 10-03-14 Last MMG (if hormonal medication request): 05-06-14 WNL  Refill authorized: please advise

## 2014-10-03 ENCOUNTER — Ambulatory Visit (INDEPENDENT_AMBULATORY_CARE_PROVIDER_SITE_OTHER): Payer: BC Managed Care – PPO | Admitting: Obstetrics and Gynecology

## 2014-10-03 ENCOUNTER — Encounter: Payer: Self-pay | Admitting: Obstetrics and Gynecology

## 2014-10-03 VITALS — BP 110/68 | HR 60 | Resp 16 | Ht 66.0 in | Wt 142.2 lb

## 2014-10-03 DIAGNOSIS — Z01419 Encounter for gynecological examination (general) (routine) without abnormal findings: Secondary | ICD-10-CM

## 2014-10-03 DIAGNOSIS — N950 Postmenopausal bleeding: Secondary | ICD-10-CM | POA: Diagnosis not present

## 2014-10-03 MED ORDER — ESTROGENS, CONJUGATED 0.625 MG/GM VA CREA: TOPICAL_CREAM | VAGINAL | Status: DC

## 2014-10-03 MED ORDER — PROGESTERONE MICRONIZED 100 MG PO CAPS: ORAL_CAPSULE | ORAL | Status: DC

## 2014-10-03 MED ORDER — ESTRADIOL ACETATE 0.05 MG/24HR VA RING: VAGINAL_RING | VAGINAL | Status: DC

## 2014-10-03 NOTE — Patient Instructions (Addendum)
EXERCISE AND DIET:  We recommended that you start or continue a regular exercise program for good health. Regular exercise means any activity that makes your heart beat faster and makes you sweat.  We recommend exercising at least 30 minutes per day at least 3 days a week, preferably 4 or 5.  We also recommend a diet low in fat and sugar.  Inactivity, poor dietary choices and obesity can cause diabetes, heart attack, stroke, and kidney damage, among others.    ALCOHOL AND SMOKING:  Women should limit their alcohol intake to no more than 7 drinks/beers/glasses of wine (combined, not each!) per week. Moderation of alcohol intake to this level decreases your risk of breast cancer and liver damage. And of course, no recreational drugs are part of a healthy lifestyle.  And absolutely no smoking or even second hand smoke. Most people know smoking can cause heart and lung diseases, but did you know it also contributes to weakening of your bones? Aging of your skin?  Yellowing of your teeth and nails?  CALCIUM AND VITAMIN D:  Adequate intake of calcium and Vitamin D are recommended.  The recommendations for exact amounts of these supplements seem to change often, but generally speaking 600 mg of calcium (either carbonate or citrate) and 800 units of Vitamin D per day seems prudent. Certain women may benefit from higher intake of Vitamin D.  If you are among these women, your doctor will have told you during your visit.    PAP SMEARS:  Pap smears, to check for cervical cancer or precancers,  have traditionally been done yearly, although recent scientific advances have shown that most women can have pap smears less often.  However, every woman still should have a physical exam from her gynecologist every year. It will include a breast check, inspection of the vulva and vagina to check for abnormal growths or skin changes, a visual exam of the cervix, and then an exam to evaluate the size and shape of the uterus and  ovaries.  And after 64 years of age, a rectal exam is indicated to check for rectal cancers. We will also provide age appropriate advice regarding health maintenance, like when you should have certain vaccines, screening for sexually transmitted diseases, bone density testing, colonoscopy, mammograms, etc.   MAMMOGRAMS:  All women over 64 years old should have a yearly mammogram. Many facilities now offer a "3D" mammogram, which may cost around $50 extra out of pocket. If possible,  we recommend you accept the option to have the 3D mammogram performed.  It both reduces the number of women who will be called back for extra views which then turn out to be normal, and it is better than the routine mammogram at detecting truly abnormal areas.    COLONOSCOPY:  Colonoscopy to screen for colon cancer is recommended for all women at age 64.  We know, you hate the idea of the prep.  We agree, BUT, having colon cancer and not knowing it is worse!!  Colon cancer so often starts as a polyp that can be seen and removed at colonscopy, which can quite literally save your life!  And if your first colonoscopy is normal and you have no family history of colon cancer, most women don't have to have it again for 10 years.  Once every ten years, you can do something that may end up saving your life, right?  We will be happy to help you get it scheduled when you are ready.    Be sure to check your insurance coverage so you understand how much it will cost.  It may be covered as a preventative service at no cost, but you should check your particular policy.     Endometrial Biopsy Endometrial biopsy is a procedure in which a tissue sample is taken from inside the uterus. The tissue sample is then looked at under a microscope to see if the tissue is normal or abnormal. The endometrium is the lining of the uterus. This procedure helps determine where you are in your menstrual cycle and how hormone levels are affecting the lining of the  uterus. This procedure may also be used to evaluate uterine bleeding or to diagnose endometrial cancer, tuberculosis, polyps, or inflammatory conditions.  LET [facility] CARE PROVIDER KNOW ABOUT:  Any allergies you have.  All medicines you are taking, including vitamins, herbs, eye drops, creams, and over-the-counter medicines.  Previous problems you or members of your family have had with the use of anesthetics.  Any blood disorders you have.  Previous surgeries you have had.  Medical conditions you have.  Possibility of pregnancy. RISKS AND COMPLICATIONS Generally, this is a safe procedure. However, as with any procedure, complications can occur. Possible complications include:  Bleeding.  Pelvic infection.  Puncture of the uterine wall with the biopsy device (rare). BEFORE THE PROCEDURE   Keep a record of your menstrual cycles as directed by your health care provider. You may need to schedule your procedure for a specific time in your cycle.  You may want to bring a sanitary pad to wear home after the procedure.  Arrange for someone to drive you home after the procedure if you will be given a medicine to help you relax (sedative). PROCEDURE   You may be given a sedative to relax you.  You will lie on an exam table with your feet and legs supported as in a pelvic exam.  Your health care provider will insert an instrument (speculum) into your vagina to see your cervix.  Your cervix will be cleansed with an antiseptic solution. A medicine (local anesthetic) will be used to numb the cervix.  A forceps instrument (tenaculum) will be used to hold your cervix steady for the biopsy.  A thin, rodlike instrument (uterine sound) will be inserted through your cervix to determine the length of your uterus and the location where the biopsy sample will be removed.  A thin, flexible tube (catheter) will be inserted through your cervix and into the uterus. The catheter is used to  collect the biopsy sample from your endometrial tissue.  The catheter and speculum will then be removed, and the tissue sample will be sent to a lab for examination. AFTER THE PROCEDURE  You will rest in a recovery area until you are ready to go home.  You may have mild cramping and a small amount of vaginal bleeding for a few days after the procedure. This is normal.  Make sure you find out how to get your test results. Document Released: 05/13/2004 Document Revised: 09/12/2012 Document Reviewed: 06/27/2012 [facility] Patient Information 2015 [place], [place]. This information is not intended to replace advice given to you by your health care provider. Make sure you discuss any questions you have with your health care provider.  Abdominal or Pelvic Ultrasound Ultrasound uses harmless sound waves instead of X-rays to take pictures of the inside of your body. A probe or wand device (transducer) is held up against your body to capture these pictures. The  continually changing images can be recorded on videotape or film. Diagnostic ultrasound imaging is commonly called sonography or ultrasonography. There are different types of ultrasound exams. An ultrasound of the gallbladder, liver, and pancreas can show gallstones, masses, cysts, inflammation, infection, or enlarged organs. An ultrasound of the kidneys can show cysts, masses, kidney stones, and kidney size and shape. A pelvic ultrasound can show the uterus, ovaries, and cysts or masses. An obstetrical ultrasound shows the position of the fetus, measurements for maturity, fetal heartbeat, and fetal organs. A breast, thyroid, or testicular ultrasound can show if a nodule is solid or cystic. RISKS AND COMPLICATIONS Ultrasound has been used for many years and has never shown any harmful effects. Studies in humans have shown no direct link between the use of ultrasound and adverse outcomes. BEFORE THE PROCEDURE Other than drinking water, do not eat or  drink for 8 to 12 hours before the test or as directed by your caregiver. Follow any other diet instructions from your caregiver. If you are having a pelvic ultrasound, you may need to drink a lot of liquid before the exam. A full bladder helps to see the organs behind the bladder better. PROCEDURE  There is no pain in an ultrasound exam. A gel is applied to your skin, and the transducer is then placed on the area to be examined. The gel may feel cool. The gel wipes off easily, but it is a good idea to wear clothing that is easily washable. The images from inside your body are displayed on one or more monitors that look like small television screens. The returning sound waves produce pictures of the organs that were in the path of the sound sent from the transducer. The room is usually darkened during the exam. This makes it easier to see the images on the monitor. The ultrasound exam should take less than 1 hour. AFTER THE PROCEDURE You can safely drive home and return to regular activities immediately after the exam. Ask when your test results will be ready. Make sure you get your test results. Document Released: 01/08/2000 Document Revised: 04/04/2011 Document Reviewed: 07/01/2010 [facility] Patient Information 2015 [place], [place]. This information is not intended to replace advice given to you by your health care provider. Make sure you discuss any questions you have with your health care provider.

## 2014-10-03 NOTE — Progress Notes (Signed)
Patient ID: Maureen Figueroa, female   DOB: 18-Jun-1950, 64 y.o.   MRN: [id number] 64 y.o. G1P1 3 Caucasian female here for annual exam.     In a new relationship.  Long distance relationship.  Sexually active.  Declines STD testing.   Taking Femring and Prometrium and Premarin cream. Feels good and wants to continue.  One spot of vaginal bleeding.  Does not remember if it was related to sexual activity.  Uncertain if it was vaginal or from a HSV outbreak. Used to have some bleeding from HSV outbreaks.  History of osteoporosis. Was on Fosamax and bone density went back up to almost normal.   Some vulvar/crural fold itching.  Some burning.   Valtrex for HSV II prophlylaxis. Dr. Ubaldo Glassing dermatologist is prescribing.  Used clobetasol for reduction of shedding to viral particles. Rx from Dr Joan Flores initially.  No longer using regularly.   Went ot the hospital due to dizziness and arm tingling.  Had normal ECHO, MRI/MRA, carotid artery doppler. Dr. Felipa Eth does not think this was a TIA.  Has a pinched nerve and low K level.  Seeing a therapist for pinched nerve.  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   PCP:   Lajean Manes, MD  Patient's last menstrual period was 09/24/2005.          Sexually active: Yes.   female The current method of family planning is post menopausal status.    Exercising: Yes.    walks "everywhere". Smoker:  no  Health Maintenance: Pap:  06-04-12 Neg:Neg HR HPV History of abnormal Pap:  no MMG:  04/26/14 Density Cat.C/benign calcifications Rt.breast:[facility] Colonoscopy:  2006 normal. Next due 2016. BMD:   2012 or 2013   Result  Last scan showed Osteopenia per patient with PCP but she states she has been treated for Osteoporosis with Fosamax prior to 2012. TDaP:  Up to date with PCP Screening Labs:  Hb today: PCP, Urine today: Unable to void   reports that she has never smoked. She has never used smokeless tobacco. She reports that she drinks about 1.2 oz of alcohol  per week. She reports that she does not use illicit drugs.  Past Medical History  Diagnosis Date  . Osteoporosis   . Endometriosis   . Urinary incontinence   . STD (sexually transmitted disease)     HSV  . Back injury 1994    back/neck injury   . Pinched nerve in neck 12/2013    Past Surgical History  Procedure Laterality Date  . Pelvic laparoscopy  1986    endometriosis  . Lasik    . Oophorectomy  12/86  . Breast surgery  07-23-08    benign Rt.breast biopsy--benign stromal fibrosis with calcifications:[facility]    Current Outpatient Prescriptions  Medication Sig Dispense Refill  . Ascorbic Acid (VITAMIN C PO) Take 500 mg by mouth daily.     Marland Kitchen aspirin EC 81 MG tablet Take 1 tablet (81 mg total) by mouth daily.    . B Complex Vitamins (B COMPLEX PO) Take by mouth daily.    . Calcium Carb-Cholecalciferol (CALCIUM 500 + D3 PO) Take 1 tablet by mouth 2 (two) times daily.    . carisoprodol (SOMA) 350 MG tablet Take 350 mg by mouth daily as needed for muscle spasms.     . clobetasol ointment (TEMOVATE) 0.05 % Apply topically 2 (two) times daily. 60 g 0  . conjugated estrogens (PREMARIN) vaginal cream Use 1/2 g vaginally 2 times per week 45 g  0  . Estradiol Acetate (FEMRING) 0.05 MG/24HR RING Insert 1 vaginally every 3 months. 1 each 0  . Multiple Vitamins-Minerals (MULTIVITAMIN WITH MINERALS) tablet Take 1 tablet by mouth daily.    . progesterone (PROMETRIUM) 100 MG capsule take 1 tablet by mouth once daily (Patient taking differently: Take 100 mg by mouth daily. take 1 tablet by mouth once daily) 90 capsule 1  . tretinoin microspheres (RETIN-A MICRO) 0.1 % gel Apply 1 application topically at bedtime.     . valACYclovir (VALTREX) 1000 MG tablet Take 1,000 mg by mouth at bedtime.      No current facility-administered medications for this visit.    Family History  Problem Relation Age of Onset  . Osteoporosis Paternal Grandmother   . Breast cancer Paternal Grandmother   .  Hypertension Mother   . COPD Mother   . Hyperlipidemia Mother   . Cancer Father 55    kidney cancer  . Osteoporosis Maternal Grandfather     ROS:  Pertinent items are noted in HPI.  Otherwise, a comprehensive ROS was negative.  Exam:   BP 110/68 mmHg  Pulse 60  Resp 16  Ht 5\' 6"  (1.676 m)  Wt 142 lb 3.2 oz (64.501 kg)  BMI 22.96 kg/m2  LMP 09/24/2005    General appearance: alert, cooperative and appears stated age Head: Normocephalic, without obvious abnormality, atraumatic Neck: no adenopathy, supple, symmetrical, trachea midline and thyroid normal to inspection and palpation Lungs: clear to auscultation bilaterally Breasts: normal appearance, no masses or tenderness, Inspection negative, No nipple retraction or dimpling, No nipple discharge or bleeding, No axillary or supraclavicular adenopathy Heart: regular rate and rhythm Abdomen: soft, non-tender; bowel sounds normal; no masses,  no organomegaly Extremities: extremities normal, atraumatic, no cyanosis or edema Skin: Skin color, texture, turgor normal. No rashes or lesions Lymph nodes: Cervical, supraclavicular, and axillary nodes normal. No abnormal inguinal nodes palpated Neurologic: Grossly normal  Pelvic: External genitalia:  no lesions              Urethra:  normal appearing urethra with no masses, tenderness or lesions              Bartholins and Skenes: normal                 Vagina: normal appearing vagina with normal color and discharge, no lesions              Cervix: no lesions and Femring in place.              Pap taken: Yes.   Bimanual Exam:  Uterus:  normal size, contour, position, consistency, mobility, non-tender              Adnexa: normal adnexa and no mass, fullness, tenderness              Rectovaginal: Yes.  .  Confirms.              Anus:  normal sphincter tone, no lesions  Chaperone was present for exam.  Assessment:   Well woman visit with normal exam. HRT patinet.  Episode of  postmenopausal bleeding.  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  Osteopenia.   Plan: Yearly mammogram recommended after age 17.  Recommended self breast exam.  Pap and HR HPV as above. Recommended Calcium, Vitamin D, regular exercise program including cardiovascular and weight bearing exercise. Labs performed.  No..   See orders. Refills given on medications.  Yes.  .  See orders.  Femring, Prometrium, Premarin cream.  Discussed risks of DVT, PE, MI, stroke, breast cancer.  Return for pelvic ultrasound and possible endometrial biopsy.  Discussed rationale at length.  Follow up annually and prn.      After visit summary provided.

## 2014-10-08 LAB — IPS PAP TEST WITH HPV

## 2014-10-09 ENCOUNTER — Ambulatory Visit: Payer: BC Managed Care – PPO | Admitting: Obstetrics and Gynecology

## 2014-10-17 ENCOUNTER — Telehealth: Payer: Self-pay | Admitting: Obstetrics and Gynecology

## 2014-10-17 NOTE — Telephone Encounter (Signed)
Patient calling to schedule an ultrasound. °

## 2014-10-20 NOTE — Telephone Encounter (Signed)
Spoke with patient. Reviewed PUS benefit information. Patient understood and agreeable. Patient had questions regarding biopsy discussed with provider. Call transferred to [facility] to discuss. Patient scheduled PUS for 10/23/14 with Dr Quincy Simmonds

## 2014-10-20 NOTE — Telephone Encounter (Signed)
Spoke with patient. Patient is asking if she needs to have an EMB at her ultrasound appointment how long it will take to ger her results back. Advised with appointment being on 9/29 results will not return until the following week. Patient is agreeable. Asking if the results return and she needs anything additional how soon this will be. Advised this will be based upon the results and Dr.Silva will make further recommendations as they are needed. Patient is agreeable. Advised this is a possible EMB and may not be needed depending on ultrasound results. Patient is agreeable. Patient verbalized understanding of the U/[facility] appointment cancellation policy. Advised will need to cancel or reschedule within 72 business hours of appointment (3 business days) or will have $100.00 late cancellation fee placed to account.  Routing to provider for final review. Patient agreeable to disposition. Will close encounter.

## 2014-10-23 ENCOUNTER — Encounter: Payer: Self-pay | Admitting: Obstetrics and Gynecology

## 2014-10-23 ENCOUNTER — Ambulatory Visit (INDEPENDENT_AMBULATORY_CARE_PROVIDER_SITE_OTHER): Payer: BC Managed Care – PPO | Admitting: Obstetrics and Gynecology

## 2014-10-23 ENCOUNTER — Ambulatory Visit (INDEPENDENT_AMBULATORY_CARE_PROVIDER_SITE_OTHER): Payer: BC Managed Care – PPO

## 2014-10-23 DIAGNOSIS — N950 Postmenopausal bleeding: Secondary | ICD-10-CM

## 2014-10-23 DIAGNOSIS — D251 Intramural leiomyoma of uterus: Secondary | ICD-10-CM | POA: Diagnosis not present

## 2014-10-23 NOTE — Patient Instructions (Signed)
Endometrial Biopsy, Care After Refer to this sheet in the next few weeks. These instructions provide you with information on caring for yourself after your procedure. Your health care provider may also give you more specific instructions. Your treatment has been planned according to current medical practices, but problems sometimes occur. Call your health care provider if you have any problems or questions after your procedure. WHAT TO EXPECT AFTER THE PROCEDURE After your procedure, it is typical to have the following:  You may have mild cramping and a small amount of vaginal bleeding for a few days after the procedure. This is normal. HOME CARE INSTRUCTIONS  Only take over-the-counter or prescription medicine as directed by your health care provider.  Do not douche, use tampons, or have sexual intercourse until your health care provider approves.  Follow your health care provider's instructions regarding any activity restrictions, such as strenuous exercise or heavy lifting. SEEK MEDICAL CARE IF:  You have heavy bleeding or bleeding longer than 2 days after the procedure.  You have bad smelling drainage from your vagina.  You have a fever and chills.  Youhave severe lower stomach (abdominal) pain. SEEK IMMEDIATE MEDICAL CARE IF:  You have severe cramps in your stomach or back.  You pass large blood clots.  Your bleeding increases.  You become weak or lightheaded, or you pass out. Document Released: 10/31/2012 Document Reviewed: 10/31/2012 [employer] Patient Information 2015 [employer]. This information is not intended to replace advice given to you by your health care provider. Make sure you discuss any questions you have with your health care provider.  

## 2014-10-23 NOTE — Progress Notes (Signed)
Subjective  64 y.o. G22P1 Caucasian female here for pelvic ultrasound for postmenopausal bleeding.  States she has had bleeding again.  Taking Femring and Prometrium and Premarin cream. Newly sexually active again.   Family history of fibroids.  Objective  Pelvic ultrasound images and report reviewed with patient.  Uterus - 36 x 47 mm fibroid - intramural.  EMS - 5.65 mm Ovaries - right ovary not seen. Left ovary absent.  Free fluid -  No.        Procedure - endometrial biopsy Consent performed. Speculum place in vagina.  Sterile prep of cervix with betadine.  Tenaculum to anterior cervical lip. Paracervical block with 10 cc 1% lidocaine _______________ yes.  Lot number [id number], exp. 01/25/15. Pipelle placed to     7     cm without difficulty twice. Tissue obtained and sent to pathology. Speculum removed.  No complications. Minimal EBL.   Assessment  Postmenopausal bleeding.  Uterine fibroid.  HRT with vaginal estrogen.   Plan  Discussion of postmenopausal bleeding and fibroid.   ACOG handout on fibroid.  Follow up EMB.  Discussed options for potential tx including - alteration of dosing of HRT, stopping HRT, hysterectomy for persistent postmenopausal bleeding.   ____15___ minutes face to face time of which over 50% was spent in counseling.

## 2014-10-27 LAB — IPS OTHER TISSUE BIOPSY

## 2014-10-29 ENCOUNTER — Encounter: Payer: Self-pay | Admitting: Obstetrics and Gynecology

## 2014-10-29 DIAGNOSIS — N950 Postmenopausal bleeding: Secondary | ICD-10-CM | POA: Insufficient documentation

## 2015-01-01 ENCOUNTER — Ambulatory Visit: Payer: BC Managed Care – PPO | Admitting: Obstetrics and Gynecology

## 2015-03-19 ENCOUNTER — Other Ambulatory Visit: Payer: Self-pay | Admitting: Obstetrics and Gynecology

## 2015-03-19 NOTE — Telephone Encounter (Signed)
Medication refill request: Premarin Cream Last AEX:  10/03/14 Dr. Quincy Simmonds Next AEX: 10/21/15 Dr. Quincy Simmonds Last MMG (if hormonal medication request): 05/06/14 BIRADS Category 2 Benign Refill authorized: 10/03/14 45 g 1 Refill  Today: #45 g 1 Refill ? Please advise

## 2015-09-06 ENCOUNTER — Other Ambulatory Visit: Payer: Self-pay | Admitting: Obstetrics and Gynecology

## 2015-09-07 ENCOUNTER — Other Ambulatory Visit: Payer: Self-pay | Admitting: Obstetrics and Gynecology

## 2015-09-07 NOTE — Telephone Encounter (Signed)
Left message to call -patient needs updated MMG before additional refills -eh

## 2015-09-07 NOTE — Telephone Encounter (Signed)
Medication refill request: Premarin  Last AEX:  10-03-14 Next AEX: 10-21-15 Last MMG (if hormonal medication request): 05-06-14 WNL Refill authorized: please advise

## 2015-09-14 ENCOUNTER — Other Ambulatory Visit: Payer: Self-pay | Admitting: Obstetrics and Gynecology

## 2015-09-14 ENCOUNTER — Telehealth: Payer: Self-pay | Admitting: Obstetrics and Gynecology

## 2015-09-14 MED ORDER — ESTROGENS, CONJUGATED 0.625 MG/GM VA CREA: TOPICAL_CREAM | VAGINAL | 0 refills | Status: DC

## 2015-09-14 MED ORDER — PROGESTERONE MICRONIZED 100 MG PO CAPS: ORAL_CAPSULE | ORAL | 0 refills | Status: DC

## 2015-09-14 NOTE — Telephone Encounter (Signed)
Medication refill request: Premarin  Last AEX:  10-03-14  Next AEX: 10-21-15 Last MMG (if hormonal medication request): 05-06-14 WNL - patient is scheduled for 09-30-15.  Refill authorized: Pt requesting a refill to last her till her MMG on 09-30-15 Please advise

## 2015-09-14 NOTE — Telephone Encounter (Signed)
Patient calling to let Margaretha Sheffield know she scheduled her mammogram for 09/30/15 at [facility]. Please call patient to let her know about her prescription.

## 2015-09-14 NOTE — Telephone Encounter (Signed)
Rx for Premarin cream and Prometrium refilled by me.  She has enough until her annual exam with me in September.   Cc- Starlyn Skeans

## 2015-09-14 NOTE — Telephone Encounter (Signed)
Call to patient. Per DPR, can leave detailed message on home or cell number. Cell number confirms "[email or web address]."  Left message Dr Quincy Simmonds has refilled medication. Call back if additional questions. Encounter closed.

## 2015-10-20 ENCOUNTER — Encounter: Payer: Self-pay | Admitting: *Deleted

## 2015-10-21 ENCOUNTER — Ambulatory Visit: Payer: BC Managed Care – PPO | Admitting: Obstetrics and Gynecology

## 2015-10-21 ENCOUNTER — Encounter: Payer: Self-pay | Admitting: Obstetrics and Gynecology

## 2015-10-21 VITALS — BP 114/72 | HR 60 | Resp 14 | Ht 66.0 in | Wt 143.0 lb

## 2015-10-21 DIAGNOSIS — Z79890 Hormone replacement therapy (postmenopausal): Secondary | ICD-10-CM

## 2015-10-21 DIAGNOSIS — Z01419 Encounter for gynecological examination (general) (routine) without abnormal findings: Secondary | ICD-10-CM

## 2015-10-21 MED ORDER — PROGESTERONE MICRONIZED 100 MG PO CAPS: ORAL_CAPSULE | ORAL | 3 refills | Status: DC

## 2015-10-21 MED ORDER — CLOBETASOL PROPIONATE 0.05 % EX OINT: TOPICAL_OINTMENT | Freq: Two times a day (BID) | CUTANEOUS | 0 refills | Status: DC

## 2015-10-21 MED ORDER — VALACYCLOVIR HCL 1 G PO TABS: 1000.0000 mg | ORAL_TABLET | Freq: Every day | ORAL | 3 refills | Status: DC

## 2015-10-21 MED ORDER — ESTRADIOL ACETATE 0.05 MG/24HR VA RING: VAGINAL_RING | VAGINAL | 3 refills | Status: DC

## 2015-10-21 NOTE — Patient Instructions (Signed)

## 2015-10-21 NOTE — Progress Notes (Signed)
64 y.o. G1P1 Married Caucasian female here for annual exam.    Has urinary frequency.  The urgency is the bigger problem.  Has a little bit of loss with cough or sneeze.  Feels this is psychological issue more than a physical issue.  Does drink coffee.   Using Femring and Prometrium.  No further postmenopausal bleeding.  In retrospect, patient thinks she had missed dosages of Prometrium.  Does have a fibroid 36 x 47 mm. Wants to continue HRT.  Sister dx of breast cancer when she was premenopausal.  She had negative genetic testing.   Dr. Ubaldo Glassing is prescribing Valtrex in past.  Hx HSV II. Patient doubles the Valtrex 1000 mg with an outbreak and uses clobetasol.  This controls her symptoms.   Busy year.  Daughter married.  Job relocated.  Separated from husband and buying a house with him.  Has a new partner.   PCP:  Lajean Manes, MD   Patient's last menstrual period was 09/24/2005.           Sexually active: Yes.   female The current method of family planning is post menopausal status.    Exercising: Yes.    walking Smoker:  no  Health Maintenance: Pap:  10-03-14 Neg:Neg HR HPV History of abnormal Pap:  no MMG:  09-30-15 Density C/stable benign calcifications in Rt.Br.and also a biopsy clip in Rt.Br./Neg/BiRads2:[facility] Colonoscopy:  06/2015 normal with [facility];next due 06/2025. BMD:   08/2015  Result:Abnormal--has appt.to see Dr. Felipa Eth TDaP:  PCP Gardasil:   N/A HIV:  Had is past.  Hep C: ? Screening Labs:  Hb today: PCP, Urine today: PCP    reports that she has never smoked. She has never used smokeless tobacco. She reports that she drinks about 1.2 oz of alcohol per week . She reports that she does not use drugs.  Past Medical History:  Diagnosis Date  . Back injury 1994   back/neck injury   . Endometriosis   . Osteoporosis   . Pinched nerve in neck 12/2013  . Postmenopausal bleeding 2016   uterine fibroid, on HRT  . STD (sexually transmitted disease)    HSV   . Urinary incontinence     Past Surgical History:  Procedure Laterality Date  . BREAST SURGERY  07-23-08   benign Rt.breast biopsy--benign stromal fibrosis with calcifications:[facility]  . LASIK    . OOPHORECTOMY  12/86  . PELVIC LAPAROSCOPY  1986   endometriosis    Current Outpatient Prescriptions  Medication Sig Dispense Refill  . Ascorbic Acid (VITAMIN C PO) Take 500 mg by mouth daily.     Marland Kitchen aspirin EC 81 MG tablet Take 1 tablet (81 mg total) by mouth daily.    . B Complex Vitamins (B COMPLEX PO) Take by mouth daily.    . Calcium Carb-Cholecalciferol (CALCIUM 500 + D3 PO) Take 1 tablet by mouth 2 (two) times daily.    . carisoprodol (SOMA) 350 MG tablet Take 350 mg by mouth daily as needed for muscle spasms.     . clobetasol ointment (TEMOVATE) 0.05 % Apply topically 2 (two) times daily. 60 g 0  . conjugated estrogens (PREMARIN) vaginal cream insert 1/2 gram vaginally two times a week 30 g 0  . Estradiol Acetate (FEMRING) 0.05 MG/24HR RING Insert 1 vaginally every 3 months. 1 each 3  . Multiple Vitamins-Minerals (MULTIVITAMIN WITH MINERALS) tablet Take 1 tablet by mouth daily.    . progesterone (PROMETRIUM) 100 MG capsule take 1 tablet by mouth  once daily 90 capsule 0  . tretinoin microspheres (RETIN-A MICRO) 0.1 % gel Apply 1 application topically at bedtime.     . valACYclovir (VALTREX) 1000 MG tablet Take 1,000 mg by mouth at bedtime.      No current facility-administered medications for this visit.     Family History  Problem Relation Age of Onset  . Hypertension Mother   . COPD Mother   . Hyperlipidemia Mother   . Cancer Father 104    kidney cancer  . Osteoporosis Paternal Grandmother   . Breast cancer Paternal Grandmother   . Osteoporosis Maternal Grandfather     ROS:  Pertinent items are noted in HPI.  Otherwise, a comprehensive ROS was negative.  Exam:   BP 114/72 (BP Location: Right Arm, Patient Position: Sitting, Cuff Size: Normal)   Pulse 60   Resp 14   Ht  5\' 6"  (1.676 m)   Wt 143 lb (64.9 kg)   LMP 09/24/2005   BMI 23.08 kg/m     General appearance: alert, cooperative and appears stated age Head: Normocephalic, without obvious abnormality, atraumatic Neck: no adenopathy, supple, symmetrical, trachea midline and thyroid normal to inspection and palpation Lungs: clear to auscultation bilaterally Breasts: normal appearance, no masses or tenderness, No nipple retraction or dimpling, No nipple discharge or bleeding, No axillary or supraclavicular adenopathy Heart: regular rate and rhythm Abdomen: soft, non-tender; no masses, no organomegaly Extremities: extremities normal, atraumatic, no cyanosis or edema Skin: Skin color, texture, turgor normal. No rashes or lesions Lymph nodes: Cervical, supraclavicular, and axillary nodes normal. No abnormal inguinal nodes palpated Neurologic: Grossly normal  Pelvic: External genitalia:  no lesions              Urethra:  normal appearing urethra with no masses, tenderness or lesions              Bartholins and Skenes: normal                 Vagina: normal appearing vagina with normal color and discharge, no lesions.  Femring in place.               Cervix: no lesions              Pap taken: No. Bimanual Exam:  Uterus:  3 cm fundal fibroid palpable.              Adnexa: no mass, fullness, tenderness              Rectal exam: Yes.  .  Confirms.  4 mm area palpable along anterior rectal wall at 12:00  Hx hemorrhoids.               Anus:  normal sphincter tone, no lesions  Chaperone was present for exam.  Assessment:   Well woman visit with normal exam. HRT patinet.  Episode of postmenopausal bleeding.  Not recurrent. Has fibroid. Hx HSV II.  On Valtrex and Clobetasol.  Osteopenia.   Recent BMD.  Results pending.  FH of breast cancer.  Sister and paternal grandmother.  Hemorrhoid?  Normal colonoscopy 3 months ago.   Plan: Yearly mammogram recommended after age 60. Discussed 3D. Recommended self  breast exam.  Pap and HR HPV as above. Discussed Calcium, Vitamin D, regular exercise program including cardiovascular and weight bearing exercise. Refill of HRT.  Discussion of WHI and benefits and risks.  Risks include DVT, PE, MI, stroke, and breast cancer.   Refill of valtrex and clobetasol. Follow up  with PCP regarding BMD and hep C testing.   Follow up annually and prn.       After visit summary provided.

## 2015-11-20 ENCOUNTER — Telehealth: Payer: Self-pay | Admitting: Endocrinology

## 2015-12-19 ENCOUNTER — Other Ambulatory Visit: Payer: Self-pay | Admitting: Obstetrics and Gynecology

## 2015-12-21 NOTE — Telephone Encounter (Signed)
Medication refill request: Premarin cream/ Clobetasol oint Last AEX:  10-21-15 Next AEX: 10-26-16  Last MMG (if hormonal medication request): 09-30-15 WNL  Refill authorized: please advise

## 2016-05-31 ENCOUNTER — Encounter: Payer: Self-pay | Admitting: Sports Medicine

## 2016-05-31 ENCOUNTER — Ambulatory Visit (INDEPENDENT_AMBULATORY_CARE_PROVIDER_SITE_OTHER): Payer: BC Managed Care – PPO | Admitting: Sports Medicine

## 2016-05-31 VITALS — BP 163/65 | Ht 66.0 in | Wt 141.0 lb

## 2016-05-31 DIAGNOSIS — M5416 Radiculopathy, lumbar region: Secondary | ICD-10-CM

## 2016-06-01 NOTE — Progress Notes (Signed)
   Subjective:    Patient ID: Maureen Figueroa, female    DOB: 06-24-1950, 67 y.o.   MRN: [id number]  HPI chief complaint: Right hip pain  Very pleasant 66 year old female comes in today complaining of long-standing posterior right hip pain. She suffered a significant ankle injury in the 1990s and she thinks that that may be contributing to her current pain. She also suffered a rather significant knee injury a couple years ago and wonders whether or not that is contributing. She complains of a "soreness" that begins in the posterior right hip and will also radiate down the right leg and into the right ankle. This is associated with some intermittent numbness in the right leg as well. Her symptoms are most pronounced with prolonged sitting such as with driving. In fact, she works as a Network engineer at [employer], and gave up driving to work due to her pain. She denies any groin pain although she does get intermittent catching in the groin. She denies low back pain. No prior surgeries on her right hip. She has had some practical adjustment in the past which was helpful but her chiropractor is no longer in practice. She denies any weakness in the right leg. No symptoms in the left leg. She was referred to [place] by her primary care physician. She has not had any imaging.  Past medical history is reviewed Medications are reviewed Allergies are reviewed She works as a Network engineer at [employer].    Review of Systems    as above Objective:   Physical Exam  Well-developed, well-nourished. No acute distress. Awake alert and oriented 3. Vital signs reviewed  Lumbar spine: Full painless lumbar range of motion. No tenderness to palpation or percussion of the lumbar midline. No spasm. No tenderness to palpation at the piriformis. No tenderness over the greater trochanter.  Right hip: Patient has decreased internal rotation by about 50% when compared to the uninvolved left hip but it does not reproduce any pain. Full  external rotation.  Right knee: Full range of motion. No effusion. No tenderness to palpation. Good ligamentous stability.  Neurological exam: Reflexes are brisk and equal at the Achilles and patellar tendons bilaterally. No atrophy. She does have slight decreased sensation to light touch along the L5 dermatome. She has 4+/5 strength with resisted great toe extension on the right compared to 5/5 on the left. Remainder of her strength is 5/5 bilaterally in both lower extremities. No appreciable leg length discrepancy. Good pulses distally.      Assessment & Plan:   Posterior right hip pain likely secondary to L5 radiculopathy  Patient's symptoms seem to suggest lumbar radiculopathy. I recommend that she avoid exercises that require bending forward at the waist and instead concentrate on those exercises that require extension. She will start doing some McKenzie extension exercises as well. Her symptoms do not bother her enough at this point in time to warrant imaging or medication. Although she does have some limited range of motion in the right hip, I do not think her symptoms are originating from osteoarthritis. We've decided to leave things open ended and she will follow-up with me if symptoms persist or worsen.

## 2016-06-24 ENCOUNTER — Other Ambulatory Visit: Payer: Self-pay | Admitting: Geriatric Medicine

## 2016-06-24 ENCOUNTER — Ambulatory Visit
Admission: RE | Admit: 2016-06-24 | Discharge: 2016-06-24 | Disposition: A | Discharge status: Home / Self Care | Payer: BC Managed Care – PPO | Source: Ambulatory Visit | Attending: Geriatric Medicine | Admitting: Geriatric Medicine

## 2016-06-24 DIAGNOSIS — R05 Cough: Secondary | ICD-10-CM

## 2016-06-24 DIAGNOSIS — R059 Cough, unspecified: Secondary | ICD-10-CM

## 2016-06-29 ENCOUNTER — Other Ambulatory Visit: Payer: Self-pay | Admitting: Geriatric Medicine

## 2016-06-29 DIAGNOSIS — I517 Cardiomegaly: Secondary | ICD-10-CM

## 2016-07-11 ENCOUNTER — Other Ambulatory Visit: Payer: Self-pay

## 2016-07-11 ENCOUNTER — Other Ambulatory Visit (HOSPITAL_COMMUNITY): Payer: BC Managed Care – PPO

## 2016-07-11 ENCOUNTER — Ambulatory Visit (HOSPITAL_COMMUNITY): Payer: BC Managed Care – PPO | Attending: Cardiology

## 2016-07-11 DIAGNOSIS — I517 Cardiomegaly: Secondary | ICD-10-CM | POA: Diagnosis present

## 2016-08-12 NOTE — Telephone Encounter (Signed)
error 

## 2016-09-01 ENCOUNTER — Other Ambulatory Visit: Payer: Self-pay | Admitting: Obstetrics and Gynecology

## 2016-09-01 NOTE — Telephone Encounter (Signed)
Medication refill request: PREMARIN Last AEX:  10/21/15 BS Next AEX: 10/26/16 BS Last MMG (if hormonal medication request): 09/30/15 BIRADS 2 benign/density c Refill authorized: 12/21/15 #30g w/2 refills; today please advise

## 2016-09-02 NOTE — Telephone Encounter (Signed)
Forwarding to Dr. Silva 

## 2016-09-28 ENCOUNTER — Other Ambulatory Visit: Payer: Self-pay | Admitting: Obstetrics and Gynecology

## 2016-09-28 NOTE — Telephone Encounter (Signed)
A 30 gram tube of premarin cream was just filled last month. She should be using 1 gram a week, so one tube should last for 30 weeks. If this request came from the patient, please call and check on what has happened. I'm refusing the medication for now.

## 2016-09-28 NOTE — Telephone Encounter (Signed)
Medication refill request: Premarin vaginal cream  Last AEX:  10-21-15  Next AEX: 10-26-16  Last MMG (if hormonal medication request): 09-30-15 WNL  Refill authorized: please advise

## 2016-10-24 NOTE — Progress Notes (Signed)
66 y.o. G1P1 Divorced Caucasian female here for annual exam.    HRT and vaginal estrogen cream.  Wants to continue.   Asking for refill fo Valtrex and clobetasol which she uses prn outbreaks.   Had Lyme disease this summer.  Presented with high fever spikes.  Neck pain and fatigue are improved. Was treated with Doxycycline.   ROS - hearing loss, having cataract surgery in November,  Urinary frequency and urgency (received a sample of Rx did not use, loss of urine with cough.sneeze a couple of times, doing tooth implant, some bruising.   Drinks 3 coffees per day.   Moving into a new home that she is renovating.   Labs with PCP.   PCP:   Dr. Felipa Eth  Patient's last menstrual period was 09/24/2005.           Sexually active: Yes.    The current method of family planning is post menopausal status.    Exercising: Yes.    walking Smoker:  no  Health Maintenance: Pap: 10-03-14 Neg:Neg HR HPV, 06-04-12 Neg:Neg HR HPV History of abnormal Pap:  no MMG: 09-30-15 3D Density C/stable benign calcifications in Rt.Br./Neg/BiRads2:[facility].   Colonoscopy: 06/2015 normal with [facility];next due 06/2025. BMD: 08/2015  Result:abnormal Dr.Stoneking.  Osteopenia.  Hx of prior osteoporosis and took Fosamax in the past. TDaP:  PCP Gardasil:   no HIV: Years ago--Neg Hep C: PCP Screening Labs: PCP  Hb today: PCP   reports that she has never smoked. She has never used smokeless tobacco. She reports that she drinks about 1.2 oz of alcohol per week . She reports that she does not use drugs.  Past Medical History:  Diagnosis Date  . Back injury 1994   back/neck injury   . Endometriosis   . Lyme disease, unspecified    followed by PCP  . Osteoporosis   . Pinched nerve in neck 12/2013  . Postmenopausal bleeding 2016   uterine fibroid, on HRT  . STD (sexually transmitted disease)    HSV  . Urinary incontinence     Past Surgical History:  Procedure Laterality Date  . BREAST SURGERY  07-23-08    benign Rt.breast biopsy--benign stromal fibrosis with calcifications:[facility]  . LASIK    . OOPHORECTOMY  12/86  . PELVIC LAPAROSCOPY  1986   endometriosis    Current Outpatient Prescriptions  Medication Sig Dispense Refill  . Ascorbic Acid (VITAMIN C PO) Take 500 mg by mouth daily.     . Calcium Carb-Cholecalciferol (CALCIUM + D3) 600-200 MG-UNIT TABS 1 tablet    . carisoprodol (SOMA) 350 MG tablet Take 350 mg by mouth daily as needed for muscle spasms.     . clobetasol ointment (TEMOVATE) 0.05 % Apply topically 2 (two) times daily as needed. 60 g 0  . Estradiol Acetate (FEMRING) 0.05 MG/24HR RING Insert 1 vaginally every 3 months. 1 each 3  . Multiple Vitamins-Minerals (MULTIVITAMIN WITH MINERALS) tablet Take 1 tablet by mouth daily.    Marland Kitchen PREMARIN vaginal cream INSERT 1/2 GRAM VAGINALLY TWO TIMES A WEEK 30 g 0  . progesterone (PROMETRIUM) 100 MG capsule take 1 tablet by mouth once daily 90 capsule 3  . tretinoin microspheres (RETIN-A MICRO) 0.1 % gel Apply 1 application topically at bedtime.     . valACYclovir (VALTREX) 1000 MG tablet Take 1 tablet (1,000 mg total) by mouth at bedtime. 100 tablet 3  . aspirin EC 81 MG tablet Take 1 tablet (81 mg total) by mouth daily. (Patient not taking:  Reported on 10/26/2016)     No current facility-administered medications for this visit.     Family History  Problem Relation Age of Onset  . Hypertension Mother   . COPD Mother   . Hyperlipidemia Mother   . Cancer Father 33       kidney cancer  . Osteoporosis Paternal Grandmother   . Breast cancer Paternal Grandmother   . Osteoporosis Maternal Grandfather     ROS:  Pertinent items are noted in HPI.  Otherwise, a comprehensive ROS was negative.  Exam:   BP 110/78 (BP Location: Right Arm, Patient Position: Sitting, Cuff Size: Normal)   Pulse 70   Resp 14   Ht 5\' 6"  (1.676 m)   Wt 145 lb (65.8 kg)   LMP 09/24/2005   BMI 23.40 kg/m     General appearance: alert, cooperative and appears  stated age Head: Normocephalic, without obvious abnormality, atraumatic Neck: no adenopathy, supple, symmetrical, trachea midline and thyroid normal to inspection and palpation Lungs: clear to auscultation bilaterally Breasts: normal appearance, no masses or tenderness, No nipple retraction or dimpling, No nipple discharge or bleeding, No axillary or supraclavicular adenopathy Heart: regular rate and rhythm Abdomen: soft, non-tender; no masses, no organomegaly Extremities: extremities normal, atraumatic, no cyanosis or edema Skin: Skin color, texture, turgor normal. No rashes or lesions Lymph nodes: Cervical, supraclavicular, and axillary nodes normal. No abnormal inguinal nodes palpated Neurologic: Grossly normal  Pelvic: External genitalia:  no lesions              Urethra:  normal appearing urethra with no masses, tenderness or lesions              Bartholins and Skenes: normal                 Vagina: normal appearing vagina with normal color and discharge, no lesions              Cervix: no lesions              Pap taken: No. Bimanual Exam:  Uterus:  normal size, contour, position, consistency, mobility, non-tender.  Femring in place.              Adnexa: no mass, fullness, tenderness             Chaperone was present for exam.  Assessment:   Well woman visit with normal exam. Fibroid. HRT patient.  Hx HSV II.  Osteopenia.  FH breast cancer.  Urinary frequency.   Plan: Mammogram screening discussed.  She will schedule. Pap and HR HPV as above. Guidelines for Calcium, Vitamin D, regular exercise program including cardiovascular and weight bearing exercise. I did discuss the WHI and increased risk of MI, stroke, DVT, PE, breast cancer.   Refill of HRT for one year.  Refill of vaginal estrogen cream.  Refill of Valtrex and clobetasol ointment.  Reduce caffeine use. Follow up annually and prn.   After visit summary provided.

## 2016-10-26 ENCOUNTER — Ambulatory Visit (INDEPENDENT_AMBULATORY_CARE_PROVIDER_SITE_OTHER): Payer: BC Managed Care – PPO | Admitting: Obstetrics and Gynecology

## 2016-10-26 ENCOUNTER — Encounter: Payer: Self-pay | Admitting: Obstetrics and Gynecology

## 2016-10-26 VITALS — BP 110/78 | HR 70 | Resp 14 | Ht 66.0 in | Wt 145.0 lb

## 2016-10-26 DIAGNOSIS — Z01419 Encounter for gynecological examination (general) (routine) without abnormal findings: Secondary | ICD-10-CM

## 2016-10-26 MED ORDER — PROGESTERONE MICRONIZED 100 MG PO CAPS: ORAL_CAPSULE | ORAL | 3 refills | Status: DC

## 2016-10-26 MED ORDER — ESTROGENS, CONJUGATED 0.625 MG/GM VA CREA: TOPICAL_CREAM | VAGINAL | 1 refills | Status: DC

## 2016-10-26 MED ORDER — ESTRADIOL ACETATE 0.05 MG/24HR VA RING: VAGINAL_RING | VAGINAL | 3 refills | Status: DC

## 2016-10-26 MED ORDER — VALACYCLOVIR HCL 1 G PO TABS: 1000.0000 mg | ORAL_TABLET | Freq: Every day | ORAL | 3 refills | Status: DC

## 2016-10-26 MED ORDER — CLOBETASOL PROPIONATE 0.05 % EX OINT: TOPICAL_OINTMENT | Freq: Two times a day (BID) | CUTANEOUS | 0 refills | Status: DC | PRN

## 2016-10-26 NOTE — Patient Instructions (Signed)

## 2016-11-24 ENCOUNTER — Encounter: Payer: Self-pay | Admitting: Obstetrics and Gynecology

## 2017-01-10 ENCOUNTER — Other Ambulatory Visit: Payer: Self-pay | Admitting: Obstetrics and Gynecology

## 2017-01-10 NOTE — Telephone Encounter (Signed)
Medication refill request: Premarin  Last AEX:  10-26-16  Next AEX: 11-15-17 Last MMG (if hormonal medication request): 11-15-16 WNL  Refill authorized: please advise

## 2017-06-01 ENCOUNTER — Telehealth: Payer: Self-pay | Admitting: Obstetrics and Gynecology

## 2017-06-01 ENCOUNTER — Other Ambulatory Visit: Payer: Self-pay | Admitting: Obstetrics and Gynecology

## 2017-06-01 MED ORDER — ESTRADIOL ACETATE 0.05 MG/24HR VA RING: VAGINAL_RING | VAGINAL | 1 refills | Status: DC

## 2017-06-01 NOTE — Telephone Encounter (Signed)
Patient spoke to pharmacy and there was an issue with insurance coverage. Patient stated that they have faxed over prior authorization.

## 2017-06-01 NOTE — Telephone Encounter (Signed)
Medication refill request: Femring  Last AEX:  10-26-16  Next AEX: 11-15-17  Last MMG (if hormonal medication request): 11-15-16 WNL  Refill authorized: please advise

## 2017-06-01 NOTE — Telephone Encounter (Signed)
Patient called following up on prescription refill. No phone note. Patient is upset because she had been playing phone tag between [facility] and the pharmacy for two weeks, and no one can give her a straight answer. She has now gone without her medication.

## 2017-06-01 NOTE — Telephone Encounter (Signed)
Patient called stating that the pharmacy has tried to refill prescription and they have not heard anything back. Pharmacy faxed over request multiple times. Patient is now overdue for it.

## 2017-06-01 NOTE — Telephone Encounter (Signed)
Prior Authorization has been initiated with plan and patient has been notified. May take up to 24 hrs for response from plan.

## 2017-06-05 MED ORDER — ESTRADIOL 2 MG VA RING: 2.0000 mg | VAGINAL_RING | VAGINAL | 1 refills | Status: DC

## 2017-06-05 NOTE — Telephone Encounter (Signed)
Spoke with patient. Patient reports Estring is covered by her plan, is requesting RX to [facility] on file. Patient reports other options such as oral estrogen were provided, patient declines this as an option. Advised will review with Dr. Quincy Simmonds and return call. Patient verbalizes understanding.   Dr. Quincy Simmonds -please review and advise.

## 2017-06-05 NOTE — Telephone Encounter (Signed)
Patient calling to check on prior authorization for medication.

## 2017-06-05 NOTE — Telephone Encounter (Signed)
[place] for [employer], place in vagina for 90 days.  Refills until annual exam is due.  It is important for the patient to know that this will treat vaginal atrophy but will not treat hot flashes or night sweats.

## 2017-06-05 NOTE — Telephone Encounter (Signed)
Spoke with patient, advised as seen below per Dr. Quincy Simmonds. Rx for Estring to verified pharmacy on file. Patient thankful for understanding and assistance with process. Will provide update at next AEX. Patient aware to return call to office with any future concerns/questions.   Routing to nursing supervisor, will close encounter.

## 2017-06-05 NOTE — Telephone Encounter (Signed)
PA response received form [facility] for [employer], PA denied.   Advised patient of PA response and reason for denial. Reviewed options with patient:  1. Appeal, this can take up to 30 days for decision 2. Pay out of pocket for medication 3. Patient to contact insurance directly for covered formulary alternatives, can then review covered alternatives with Dr. Quincy Simmonds.   Patient expressed frustration about PA process, advised patient I will forward concerns to nursing supervisor. Patient will contact insurance provider and return call.

## 2017-06-06 NOTE — Telephone Encounter (Signed)
Follow-up call to patient. States she is aware she delayed in calling for refill and is unsure if pharmacy actually called as often as they indicated.  States "it just took a long time to get medication refilled" and I was out of medication for about 2 1/2 weeks.  She is picking up new medication today and although she has some concerns about transition, she will try this and discuss with Dr Quincy Simmonds at annual exam in October. Patient appreciative of call.

## 2017-06-09 ENCOUNTER — Telehealth: Payer: Self-pay | Admitting: Obstetrics and Gynecology

## 2017-06-09 NOTE — Telephone Encounter (Signed)
Patient would like to speak with a nurse regarding a new prescription she is taking.

## 2017-06-09 NOTE — Telephone Encounter (Signed)
Spoke with patient. Patient calling with update for Dr. Quincy Simmonds. Started Estring this week, is aware this is for vaginal dryness.   Reports increase in night sweats and hot flashes this wk. Denies any other symptoms.   After further discussion with patient, reports she has not used her premarin vaginal cream in the last 2 wks. Patient will restart premarin, will call if hot flashes and night sweats do not resolve. Recommended OV for further discussion with Dr. Quincy Simmonds, patient declined at this time.  Routing to provider for final review. Patient is agreeable to disposition. Will close encounter.

## 2017-06-10 NOTE — Telephone Encounter (Signed)
If patient is using both Estring and Premarin vaginal cream, she needs to continue with the Prometrium daily.

## 2017-06-12 NOTE — Telephone Encounter (Signed)
Spoke with patient. Patient has been taking Prometrium daily, is aware of importance. Patient states she noticed improvement in symptoms when restarting premarin. Will f/u if any additional concerns. Thankful for return call.   Routing to provider for final review. Patient is agreeable to disposition. Will close encounter.

## 2017-06-12 NOTE — Telephone Encounter (Addendum)
No answer, mailbox full, unable to leave message.

## 2017-11-07 ENCOUNTER — Other Ambulatory Visit: Payer: Self-pay | Admitting: Obstetrics and Gynecology

## 2017-11-07 NOTE — Telephone Encounter (Signed)
Medication refill request: valtrex 1000mg  Last AEX:  10/26/16 Next AEX: 11/15/17 Last MMG (if hormonal medication request): 11/15/16 Bi-rads 2 benign  Refill authorized: #100 with 0 RF

## 2017-11-08 ENCOUNTER — Other Ambulatory Visit: Payer: Self-pay | Admitting: Obstetrics and Gynecology

## 2017-11-08 NOTE — Telephone Encounter (Signed)
Medication refill request: Valtrex  1000 Last AEX:  10.3.18 Next AEX: 11/15/17 Last MMG (if hormonal medication request): 11/15/16 bi-rads 2 benign  Refill authorized: #100 with 0RF

## 2017-11-13 NOTE — Progress Notes (Signed)
67 y.o. G1P1 Married Caucasian female here for annual exam.    On HRT.   Needs refills of Valtrex, Clobetasol (uses with an outbreak of valtrex) Premarin cream, Estring, and Prometrium.  Has genital and oral HSV.  Urinary frequency, urgency, loss of urine with cough/sneeze, and night time urination.   Pain under both arms. She thought she felt a lump under one but can't remember which one. Does not feel a lump now.   PCP:   Lajean Manes, MD  Patient's last menstrual period was 09/24/2005.           Sexually active: Yes.    The current method of family planning is post menopausal status.    Exercising: Yes.    dances 3x/week Smoker:  no  Health Maintenance: Pap: 10-03-14 Neg:Neg HR HPV, 06-04-12 Neg:Neg HR HPV History of abnormal Pap:  no MMG: 11-15-16 3D/Neg/Density C/BiRads2.  Has appt for tomorrow.  Colonoscopy: 06/2015 normal with [facility];next due 06/2025. BMD: October - 2019 -  Result :Osteopenia Dr.Stoneking--hx Osteoporosis and took Fosamax in past. TDaP:  PCP Gardasil:   no HIV: Years ago--NR Hep C: PCP Screening Labs:  Hb today: PCP    reports that she has never smoked. She has never used smokeless tobacco. She reports that she drinks about 7.0 standard drinks of alcohol per week. She reports that she does not use drugs.  Past Medical History:  Diagnosis Date  . Back injury 1994   back/neck injury   . Endometriosis   . Lyme disease, unspecified    followed by PCP  . Osteoporosis   . Pinched nerve in neck 12/2013  . Postmenopausal bleeding 2016   uterine fibroid, on HRT  . STD (sexually transmitted disease)    HSV  . Urinary incontinence     Past Surgical History:  Procedure Laterality Date  . BREAST SURGERY  07-23-08   benign Rt.breast biopsy--benign stromal fibrosis with calcifications:[facility]  . CATARACT EXTRACTION, BILATERAL Bilateral 11/2016, 03/2017  . LASIK    . OOPHORECTOMY  12/86  . PELVIC LAPAROSCOPY  1986   endometriosis    Current Outpatient  Medications  Medication Sig Dispense Refill  . Ascorbic Acid (VITAMIN C PO) Take 500 mg by mouth daily.     Marland Kitchen aspirin EC 81 MG tablet Take 1 tablet (81 mg total) by mouth daily.    . Calcium Carb-Cholecalciferol (CALCIUM + D3) 600-200 MG-UNIT TABS 1 tablet    . carisoprodol (SOMA) 350 MG tablet Take 350 mg by mouth daily as needed for muscle spasms.     . clobetasol ointment (TEMOVATE) 0.05 % Apply topically 2 (two) times daily as needed. Use for 1 - 2 weeks at a time as needed. 60 g 0  . conjugated estrogens (PREMARIN) vaginal cream INSERT 0.5 GRAM TO VULVA 2 TIMES A WEEK 30 g 1  . estradiol (ESTRING) 2 MG vaginal ring Place 3 mg vaginally every 3 (three) months. follow package directions 1 each 3  . Multiple Vitamins-Minerals (MULTIVITAMIN WITH MINERALS) tablet Take 1 tablet by mouth daily.    . progesterone (PROMETRIUM) 100 MG capsule take 1 tablet by mouth once daily 90 capsule 3  . tretinoin microspheres (RETIN-A MICRO) 0.1 % gel Apply 1 application topically at bedtime.     . valACYclovir (VALTREX) 1000 MG tablet TAKE 1 TABLET(1000 MG) BY MOUTH AT BEDTIME.  Take one tablet (1000 mg) by mouth twice a day for 3 days for genital outbreak and take two tablets (2000 mg) by mouth  every 12 hours for 24 hours for an oral outbreak. 110 tablet 3   No current facility-administered medications for this visit.     Family History  Problem Relation Age of Onset  . Hypertension Mother   . COPD Mother   . Hyperlipidemia Mother   . Cancer Father 65       kidney cancer  . Osteoporosis Paternal Grandmother   . Breast cancer Paternal Grandmother   . Osteoporosis Maternal Grandfather   . Cancer Sister 44       breast cancer    Review of Systems  Constitutional: Positive for unexpected weight change (weight gain).  HENT: Positive for hearing loss (wears hearing aids).   All other systems reviewed and are negative.   Exam:   BP 118/68 (BP Location: Right Arm, Patient Position: Sitting, Cuff Size:  Normal)   Pulse 64   Resp 16   Ht 5\' 6"  (1.676 m)   Wt 151 lb 6.4 oz (68.7 kg)   LMP 09/24/2005   BMI 24.44 kg/m     General appearance: alert, cooperative and appears stated age Head: Normocephalic, without obvious abnormality, atraumatic Neck: no adenopathy, supple, symmetrical, trachea midline and thyroid normal to inspection and palpation Lungs: clear to auscultation bilaterally Breasts: normal appearance, no masses or tenderness, No nipple retraction or dimpling, No nipple discharge or bleeding, No axillary or supraclavicular adenopathy Heart: regular rate and rhythm Abdomen: soft, non-tender; no masses, no organomegaly Extremities: extremities normal, atraumatic, no cyanosis or edema Skin: Skin color, texture, turgor normal. No rashes or lesions Lymph nodes: Cervical, supraclavicular, and axillary nodes normal. No abnormal inguinal nodes palpated Neurologic: Grossly normal  Pelvic: External genitalia:  no lesions              Urethra:  normal appearing urethra with no masses, tenderness or lesions              Bartholins and Skenes: normal                 Vagina: normal appearing vagina with normal color and discharge, no lesions              Cervix: no lesions              Pap taken: Yes.   Bimanual Exam:  Uterus:  normal size, contour, position, consistency, mobility, non-tender              Adnexa: no mass, fullness, tenderness              Rectal exam: Yes.  .  Confirms.              Anus:  normal sphincter tone, no lesions  Chaperone was present for exam.  Assessment:   Well woman visit with normal exam. Fibroid. Vaginal atrophy. Hx HSV II.  Thinks may also have oral HSV.  Osteopenia. PCP following.  FH breast cancer. MGM and sister.  Urinary frequency.  Has med but has not taken it.   Plan: Mammogram screening. Recommended self breast awareness. Pap and HR HPV as above. Guidelines for Calcium, Vitamin D, regular exercise program including cardiovascular and  weight bearing exercise. Can stop the Prometrium. Refill of Valtrex.  Refill of Estring and Premarin cream.  Discussed potential. effect on breast cancer.  We reviewed med for overactive bladder. Follow up annually and prn.   After visit summary provided.

## 2017-11-15 ENCOUNTER — Other Ambulatory Visit: Payer: Self-pay

## 2017-11-15 ENCOUNTER — Other Ambulatory Visit (HOSPITAL_COMMUNITY)
Admission: RE | Admit: 2017-11-15 | Discharge: 2017-11-15 | Disposition: A | Discharge status: Home / Self Care | Payer: BC Managed Care – PPO | Source: Ambulatory Visit | Attending: Obstetrics and Gynecology | Admitting: Obstetrics and Gynecology

## 2017-11-15 ENCOUNTER — Encounter: Payer: Self-pay | Admitting: Obstetrics and Gynecology

## 2017-11-15 ENCOUNTER — Ambulatory Visit (INDEPENDENT_AMBULATORY_CARE_PROVIDER_SITE_OTHER): Payer: BC Managed Care – PPO | Admitting: Obstetrics and Gynecology

## 2017-11-15 VITALS — BP 118/68 | HR 64 | Resp 16 | Ht 66.0 in | Wt 151.4 lb

## 2017-11-15 DIAGNOSIS — N3281 Overactive bladder: Secondary | ICD-10-CM | POA: Insufficient documentation

## 2017-11-15 DIAGNOSIS — Z01419 Encounter for gynecological examination (general) (routine) without abnormal findings: Secondary | ICD-10-CM | POA: Insufficient documentation

## 2017-11-15 MED ORDER — ESTROGENS, CONJUGATED 0.625 MG/GM VA CREA: TOPICAL_CREAM | VAGINAL | 1 refills | Status: DC

## 2017-11-15 MED ORDER — VALACYCLOVIR HCL 1 G PO TABS: ORAL_TABLET | ORAL | 3 refills | Status: DC

## 2017-11-15 MED ORDER — ESTRADIOL 2 MG VA RING: 3.0000 mg | VAGINAL_RING | VAGINAL | 3 refills | Status: DC

## 2017-11-15 NOTE — Patient Instructions (Signed)

## 2017-11-20 ENCOUNTER — Telehealth: Payer: Self-pay | Admitting: Obstetrics and Gynecology

## 2017-11-20 LAB — CYTOLOGY - PAP
Diagnosis: NEGATIVE
HPV: NOT DETECTED

## 2017-11-20 NOTE — Telephone Encounter (Signed)
The Estring prescription should be for 2 mg and not 3 mg.

## 2017-11-20 NOTE — Telephone Encounter (Signed)
Spoke with Clarise Cruz at [facility]. Confirmed Estring 2mg . Read back and confirmed.   Encounter closed.

## 2017-11-20 NOTE — Telephone Encounter (Signed)
Dr. Quincy Simmonds -please confirm Estring 2mg  vaginal ring Rx. 2mg  vaginal ring?

## 2017-11-20 NOTE — Telephone Encounter (Addendum)
Maureen Figueroa from [facility] to clarify Estring prescription that was called in. Judson Roch stating that the Estring that was called in was for 2MG , but the directions say to insert 3MG  vaginally.

## 2017-11-29 ENCOUNTER — Other Ambulatory Visit: Payer: Self-pay | Admitting: Obstetrics and Gynecology

## 2017-12-01 ENCOUNTER — Other Ambulatory Visit: Payer: Self-pay | Admitting: Obstetrics and Gynecology

## 2017-12-01 MED ORDER — VALACYCLOVIR HCL 1 G PO TABS: ORAL_TABLET | ORAL | 3 refills | Status: DC

## 2017-12-01 NOTE — Telephone Encounter (Signed)
Sarah from [facility] calling regarding Valtrex prescription. States patient was told prescription would be a 90 day supply, but only 30 called in. Patient was also told that during a flare up, she was to increase to 4, but this information is not on the order. Pharmacy number [phone].

## 2017-12-01 NOTE — Telephone Encounter (Signed)
Rx faxed to [facility] at this time.  Signed by Dr. Quincy Simmonds.  Sig too long to send electronically. Encounter closed.

## 2018-03-12 ENCOUNTER — Encounter: Payer: Self-pay | Admitting: Obstetrics and Gynecology

## 2018-05-25 ENCOUNTER — Other Ambulatory Visit: Payer: Self-pay | Admitting: Obstetrics and Gynecology

## 2018-05-25 NOTE — Telephone Encounter (Signed)
Medication refill request: premarin vaginal cream Last AEX:  11-15-17 Next AEX: 11-28-2018 Last MMG (if hormonal medication request): 11-16-17 category c density birads 2:neg Refill authorized: please approve if appropriate

## 2018-07-24 ENCOUNTER — Telehealth: Payer: Self-pay | Admitting: Obstetrics and Gynecology

## 2018-07-24 ENCOUNTER — Other Ambulatory Visit: Payer: Self-pay | Admitting: Obstetrics and Gynecology

## 2018-07-24 NOTE — Telephone Encounter (Signed)
Medication refill request: Premarin  Last AEX:  11/15/17 Next AEX: 11/28/18 Last MMG (if hormonal medication request):03/12/18 Birads 2 benign  Refill authorized: 30g with 1 rf

## 2018-07-24 NOTE — Telephone Encounter (Signed)
Call to patient. Advised patient on 05-25-2018, 30grams with 1RF sent to pharmacy. Advised patient to contact pharmacy for refill and return call to office if needed. Patient agreeable.

## 2018-07-24 NOTE — Telephone Encounter (Signed)
Patient's refill for premarin was denied. [facility] [phone] [address]

## 2018-08-15 ENCOUNTER — Other Ambulatory Visit: Payer: Self-pay | Admitting: Obstetrics & Gynecology

## 2018-08-15 ENCOUNTER — Other Ambulatory Visit: Payer: Self-pay | Admitting: Obstetrics and Gynecology

## 2018-09-18 ENCOUNTER — Other Ambulatory Visit: Payer: Self-pay | Admitting: Obstetrics and Gynecology

## 2018-09-18 NOTE — Telephone Encounter (Signed)
Medication refill request: Estring Last AEX:  11/15/2017 Next AEX: 11/28/2018 Last MMG (if hormonal medication request): 11/16/2017 BIRADS 2 Benign Density C Refill authorized: Pending approval. #1 with 3 refills if authorized. Please advise.

## 2018-10-22 ENCOUNTER — Other Ambulatory Visit: Payer: Self-pay | Admitting: Obstetrics and Gynecology

## 2018-10-22 NOTE — Telephone Encounter (Signed)
Medication refill request: Premarin Last AEX:  11/15/2017 BS Next AEX: 11/28/2018 Last MMG (if hormonal medication request): 11/16/2017 BIRADS 2 Benign Density C Refill authorized: Pending 1 30g tube with no refills if appropriate. Please advise.

## 2018-11-23 ENCOUNTER — Other Ambulatory Visit: Payer: Self-pay | Admitting: Obstetrics and Gynecology

## 2018-11-23 NOTE — Telephone Encounter (Signed)
Patient is calling regarding refills for medications to get her to her annual exam. Patient reschedule annual exam at time of call due to not feeling comfortable coming into the office due to current COVID spike. Patient scheduled annual exam for 02/05/2019 at time of call. Patient is requesting refills on Valtrex 1000MG , Estring 2 MG, and Premarin vaginal cream. Patient confirmed pharmacy as [facility] on [address].

## 2018-11-23 NOTE — Telephone Encounter (Signed)
Medication refill request: Valtrex Last AEX:  11/15/2017 BS Next AEX: 02/05/2019 Last MMG (if hormonal medication request): 11/16/2017 BIRADS 2 Benign Density C Refill authorized: Pending #110 with no refills if appropriate. Please advise.  Medication refill request: Estring Last AEX:  11/15/2017 BS Next AEX: 02/05/2019 Last MMG (if hormonal medication request): 11/16/2017 BIRADS 2 Benign Density C Refill authorized: Pending #1 with no refills if appropriate. Please advise.  Medication refill request: Premarin Last AEX:  11/15/2017 BS Next AEX: 02/14/2019 Last MMG (if hormonal medication request): 11/16/2017 BIRADS 2 Benign Density C Refill authorized: Pending 30g with no refills if appropriate. Please advise.

## 2018-11-26 MED ORDER — VALACYCLOVIR HCL 1 G PO TABS: ORAL_TABLET | ORAL | 0 refills | Status: DC

## 2018-11-26 MED ORDER — PREMARIN 0.625 MG/GM VA CREA: TOPICAL_CREAM | VAGINAL | 0 refills | Status: DC

## 2018-11-26 MED ORDER — ESTRING 2 MG VA RING: VAGINAL_RING | VAGINAL | 0 refills | Status: DC

## 2018-11-28 ENCOUNTER — Ambulatory Visit: Payer: BC Managed Care – PPO | Admitting: Obstetrics and Gynecology

## 2018-12-28 ENCOUNTER — Other Ambulatory Visit: Payer: Self-pay | Admitting: Obstetrics and Gynecology

## 2018-12-28 DIAGNOSIS — Z01419 Encounter for gynecological examination (general) (routine) without abnormal findings: Secondary | ICD-10-CM

## 2018-12-28 NOTE — Telephone Encounter (Signed)
Med refill request: Estring Last AEX:11/15/2017 Next AEX:02/05/2019 Last MMG (if hormonal med) 11/16/2017, BIRADS 2, Benign Refill authorized: #1 ring, 0RF,pended if approved.

## 2018-12-31 NOTE — Telephone Encounter (Signed)
Attempted to call pt to update on refused Estring. Pt voicemail box full and cannot leave message. Will try again.

## 2019-01-02 NOTE — Telephone Encounter (Signed)
Pt returned call for Estring Rx request. Pt states having 1 more ring left but wanted to go ahead and request another d/t possibly not coming in for AEX appt on 02/05/2019 due to Covid. Pt keeping appt for now,but will let us know if doesn't feel comfortable to come in and then request refill. Pt also states having got a letter from [facility] about wanting estring as a generic to be covered. Explained to pt that we will see about different coverage from either [place] or get a PA from [employer] when needing next refill. Pt agreeable.   Will route to Dr Quincy Simmonds for review and will close encounter.

## 2019-02-05 ENCOUNTER — Ambulatory Visit: Payer: BC Managed Care – PPO | Admitting: Obstetrics and Gynecology

## 2019-02-16 ENCOUNTER — Ambulatory Visit: Payer: BC Managed Care – PPO | Attending: Internal Medicine

## 2019-02-16 DIAGNOSIS — Z23 Encounter for immunization: Secondary | ICD-10-CM | POA: Insufficient documentation

## 2019-02-16 NOTE — Progress Notes (Signed)
   Covid-19 Vaccination Clinic  Name:  Maureen Figueroa    MRN: [id number] DOB: [birth year]  02/16/2019  [name] was observed post Covid-19 immunization for 15 minutes without incidence. She was provided with Vaccine Information Sheet and instruction to access the V-Safe system.   [name] was instructed to call 911 with any severe reactions post vaccine: Marland Kitchen Difficulty breathing  . Swelling of your face and throat  . A fast heartbeat  . A bad rash all over your body  . Dizziness and weakness    Immunizations Administered    Name Date Dose VIS Date Route   Pfizer COVID-19 Vaccine 02/16/2019  2:52 PM 0.3 mL 01/04/2019 Intramuscular   Manufacturer: [place]   Lot: [id number]   [place]: [id number]

## 2019-02-20 ENCOUNTER — Other Ambulatory Visit: Payer: Self-pay

## 2019-02-20 MED ORDER — ESTRING 2 MG VA RING: VAGINAL_RING | VAGINAL | 0 refills | Status: DC

## 2019-02-20 NOTE — Telephone Encounter (Signed)
Medication refill request: estring Last AEX:  11-15-17 Next AEX: 05-14-2019 Last MMG (if hormonal medication request): 11-16-2017 category c density birads 2:neg, scheduled for 04-02-2019 Refill authorized: please approve if appropriate

## 2019-03-10 ENCOUNTER — Ambulatory Visit: Payer: BC Managed Care – PPO | Attending: Internal Medicine

## 2019-03-10 DIAGNOSIS — Z23 Encounter for immunization: Secondary | ICD-10-CM

## 2019-03-10 NOTE — Progress Notes (Signed)
   Covid-19 Vaccination Clinic  Name:  Maureen Figueroa    MRN: [id number] DOB: 12-09-1950  03/10/2019  [name] was observed post Covid-19 immunization for 15 minutes without incidence. She was provided with Vaccine Information Sheet and instruction to access the V-Safe system.   [name] was instructed to call 911 with any severe reactions post vaccine: Marland Kitchen Difficulty breathing  . Swelling of your face and throat  . A fast heartbeat  . A bad rash all over your body  . Dizziness and weakness    Immunizations Administered    Name Date Dose VIS Date Route   Pfizer COVID-19 Vaccine 03/10/2019 11:16 AM 0.3 mL 01/04/2019 Intramuscular   Manufacturer: [place]   Lot: [id number]   [place]: [id number]

## 2019-04-29 ENCOUNTER — Encounter: Payer: Self-pay | Admitting: Obstetrics and Gynecology

## 2019-05-07 ENCOUNTER — Telehealth: Payer: Self-pay

## 2019-05-07 DIAGNOSIS — Z01419 Encounter for gynecological examination (general) (routine) without abnormal findings: Secondary | ICD-10-CM

## 2019-05-07 MED ORDER — ESTRING 2 MG VA RING: VAGINAL_RING | VAGINAL | 0 refills | Status: DC

## 2019-05-07 NOTE — Telephone Encounter (Signed)
Patient would like to speak to a nurse regarding Estring refill. Patients appointment was moved due to Dr. Quincy Simmonds family emergency. Patient stated she will run out of medication before her new rescheduled appointment. Patient also stated that the Estring was serving a double purpose as to help with bone density issues.

## 2019-05-07 NOTE — Telephone Encounter (Signed)
Placed call to pt. No answer, unable to leave voicemail due to mail box full.

## 2019-05-07 NOTE — Telephone Encounter (Addendum)
Spoke with pt. Pt states needing new Rx for Estring before rescheduled AEX now on 05/29/19 with Dr Quincy Simmonds. Pt appt rescheduled due to Dr Elza Rafter absence from 05/14/2019. Pt states will be out of Rx on 05/25/19.  Pt had recent screening MMG on 04/30/19 at [facility]. Faxed report received. Placed on Dr Gentry Fitz desk for review.   Birads: 1 Negative ,Density Category C  Pharmacy verified.Rx #1, 0RF sent.

## 2019-05-14 ENCOUNTER — Ambulatory Visit: Payer: BC Managed Care – PPO | Admitting: Obstetrics and Gynecology

## 2019-05-24 ENCOUNTER — Encounter: Payer: Self-pay | Admitting: Obstetrics and Gynecology

## 2019-05-28 ENCOUNTER — Other Ambulatory Visit: Payer: Self-pay

## 2019-05-29 ENCOUNTER — Ambulatory Visit: Payer: BC Managed Care – PPO | Admitting: Obstetrics and Gynecology

## 2019-05-29 ENCOUNTER — Encounter: Payer: Self-pay | Admitting: Obstetrics and Gynecology

## 2019-05-29 VITALS — BP 112/60 | HR 80 | Temp 97.7°F | Ht 66.75 in | Wt 155.0 lb

## 2019-05-29 DIAGNOSIS — Z01419 Encounter for gynecological examination (general) (routine) without abnormal findings: Secondary | ICD-10-CM

## 2019-05-29 MED ORDER — PREMARIN 0.625 MG/GM VA CREA: TOPICAL_CREAM | VAGINAL | 2 refills | Status: DC

## 2019-05-29 MED ORDER — ESTRING 2 MG VA RING: VAGINAL_RING | VAGINAL | 2 refills | Status: DC

## 2019-05-29 NOTE — Patient Instructions (Signed)

## 2019-05-29 NOTE — Progress Notes (Signed)
69 y.o. G1P1 Married Caucasian female here for annual exam.    She was told her insurance would not cover her Estring.  She did not pick up the Estring yet that was sent to her pharmacy.  She is due to switch this soon.  She is using just a little bit of Premarin vaginal cream on the outside.   Mother died a few weeks ago.  She had COPD.   Not using Clobetasol when she has an outbreak of HSV.  Has not had an outbreak for a long time.   Patient has been vaccinated against Covid.   She stopped her baby ASA per day.   Drinking coffee.  Having urinary frequency and gets up sometimes 2 times a night to void.   Had labs with PCP recently.  Her good cholesterol went down.   PCP:  Lajean Manes, MD   Patient's last menstrual period was 09/24/2005.           Sexually active: Yes.    The current method of family planning is post menopausal status.    Exercising: Yes.    walking and planks Smoker:  no  Health Maintenance: Pap:  11/15/17 Neg:Neg HR HPV  10-03-14 Neg:Neg HR HPV  06-04-12 Neg:Neg HR HPV History of abnormal Pap:  no MMG:  04/30/19 BIRADS 1 negative/density c Colonoscopy:  06/2015 normal with [facility];next due 06/2025 BMD:   October 2019  Result  Osteopenia -- hx of Osteoporosis and took Fosamax TDaP:  2017 Gardasil:   n/a HIV: Neg years ago Hep C: PCP Screening Labs:  PCP   reports that she has never smoked. She has never used smokeless tobacco. She reports current alcohol use of about 7.0 standard drinks of alcohol per week. She reports that she does not use drugs.  Past Medical History:  Diagnosis Date  . Back injury 1994   back/neck injury   . Endometriosis   . Lyme disease, unspecified    followed by PCP  . Osteoporosis   . Pinched nerve in neck 12/2013  . Postmenopausal bleeding 2016   uterine fibroid, on HRT  . STD (sexually transmitted disease)    HSV  . Urinary incontinence     Past Surgical History:  Procedure Laterality Date  . BREAST SURGERY   07-23-08   benign Rt.breast biopsy--benign stromal fibrosis with calcifications:[facility]  . CATARACT EXTRACTION, BILATERAL Bilateral 11/2016, 03/2017  . LASIK    . OOPHORECTOMY  12/86  . PELVIC LAPAROSCOPY  1986   endometriosis    Current Outpatient Medications  Medication Sig Dispense Refill  . Ascorbic Acid (VITAMIN C PO) Take 500 mg by mouth daily.     . Calcium Carb-Cholecalciferol (CALCIUM + D3) 600-200 MG-UNIT TABS 1 tablet    . carisoprodol (SOMA) 350 MG tablet Take 350 mg by mouth daily as needed for muscle spasms.     Marland Kitchen conjugated estrogens (PREMARIN) vaginal cream Use pea size amount to the vulva at hs two to three times per week. 30 g 2  . estradiol (ESTRING) 2 MG vaginal ring Place in vagina for 90 days.  Then remove and discard the ring. 1 each 2  . tretinoin microspheres (RETIN-A MICRO) 0.1 % gel Apply 1 application topically at bedtime.     . valACYclovir (VALTREX) 1000 MG tablet TAKE 1 TABLET(1000 MG) BY MOUTH AT BEDTIME.  Take one tablet (1000 mg) by mouth twice a day for 3 days for genital outbreak and take two tablets (2000 mg) by mouth  every 12 hours for 24 hours for an oral outbreak. 110 tablet 0  . aspirin EC 81 MG tablet Take 1 tablet (81 mg total) by mouth daily. (Patient not taking: Reported on 05/29/2019)    . Multiple Vitamins-Minerals (MULTIVITAMIN WITH MINERALS) tablet Take 1 tablet by mouth daily.     No current facility-administered medications for this visit.    Family History  Problem Relation Age of Onset  . Hypertension Mother   . COPD Mother   . Hyperlipidemia Mother   . Cancer Father 26       kidney cancer  . Osteoporosis Paternal Grandmother   . Breast cancer Paternal Grandmother   . Osteoporosis Maternal Grandfather   . Cancer Sister 80       breast cancer    Review of Systems  Constitutional: Negative.   HENT: Negative.   Eyes: Negative.   Respiratory: Negative.   Cardiovascular: Negative.   Gastrointestinal: Negative.   Endocrine:  Negative.   Genitourinary: Negative.   Musculoskeletal: Negative.   Skin: Negative.   Allergic/Immunologic: Negative.   Neurological: Negative.   Hematological: Negative.   Psychiatric/Behavioral: Negative.     Exam:   BP 112/60 (BP Location: Right Arm, Patient Position: Sitting, Cuff Size: Normal)   Pulse 80   Temp 97.7 F (36.5 C) (Temporal)   Ht 5' 6.75" (1.695 m)   Wt 155 lb (70.3 kg)   LMP 09/24/2005   BMI 24.46 kg/m     General appearance: alert, cooperative and appears stated age Head: normocephalic, without obvious abnormality, atraumatic Neck: no adenopathy, supple, symmetrical, trachea midline and thyroid normal to inspection and palpation Lungs: clear to auscultation bilaterally Breasts: normal appearance, no masses or tenderness, No nipple retraction or dimpling, No nipple discharge or bleeding, No axillary adenopathy Heart: regular rate and rhythm Abdomen: soft, non-tender; no masses, no organomegaly Extremities: extremities normal, atraumatic, no cyanosis or edema Skin: skin color, texture, turgor normal. No rashes or lesions Lymph nodes: cervical, supraclavicular, and axillary nodes normal. Neurologic: grossly normal  Pelvic: External genitalia:  no lesions              No abnormal inguinal nodes palpated.              Urethra:  normal appearing urethra with no masses, tenderness or lesions              Bartholins and Skenes: normal                 Vagina: normal appearing vagina with normal color and discharge, no lesions              Cervix: no lesions              Pap taken: No. Bimanual Exam:  Uterus:  normal size, contour, position, consistency, mobility, non-tender              Adnexa: no mass, fullness, tenderness              Rectal exam: Yes.  .  Confirms.              Anus:  normal sphincter tone, no lesions  Chaperone was present for exam.  Assessment:   Well woman visit with normal exam. Fibroid. Vaginal atrophy. Hx HSV II.  Thinks may  also have oral HSV.  Osteopenia. PCP following.  FH breast cancer. MGM and sister.  Urinary frequency.  Has med but has not taken it.    Plan: Mammogram screening  discussed. Self breast awareness reviewed. Pap and HR HPV as above. Guidelines for Calcium, Vitamin D, regular exercise program including cardiovascular and weight bearing exercise. Will get a copy of her labs from her PCP in order to continue her Valtrex.  Refill of Estring and Premarin cream.  I reviewed importance of normal yearly mammogram to continue with estrogen treatments.  She will try to reduce caffeine intake before starting on medication for over active bladder. Follow up annually and prn.   After visit summary provided.

## 2019-05-31 ENCOUNTER — Telehealth: Payer: Self-pay | Admitting: Obstetrics and Gynecology

## 2019-05-31 NOTE — Telephone Encounter (Signed)
Patient says she will need authorization for her medication.

## 2019-05-31 NOTE — Telephone Encounter (Signed)
Attempted to leave message, voicemail box full and no message left. Mychart message sent to pt for return call to office.

## 2019-06-03 NOTE — Telephone Encounter (Signed)
Spoke with pt. Pt calling to follow up on needed PA for Estring Rx.  Received Rx PA fax on Friday.  PA submitted today 06/03/19. Case ID: [id number] Key: [id number]  Pt aware of submitted PA for Estring. Pt states needs to change Estring on 06/07/19.  Will return call to pt once PA approved or denied. Pt agreeable and verbalized understanding.

## 2019-06-06 NOTE — Telephone Encounter (Signed)
Received fax regarding PA for Estring    PA Denied on May 10 Your PA request has been denied. Per covermymeds through [facility] [employer].  Call placed to pt. VM box full. No answer and cannot leave message. Will send mychart message to pt.

## 2019-06-07 NOTE — Telephone Encounter (Signed)
Spoke with pt. Pt given update on PA that was denied for Estring Rx.  Pt states uses of Estring:  1. Vaginal atrophy 2. Hormone replacement to help with BMD 3. Urinary incontinence 4. Endometriosis  Will call [employer] to submit another PA. Pt aware and agreeable. Pt states will leave in Estring that is supposed to be changed today until get approval or denial from PA.

## 2019-06-07 NOTE — Telephone Encounter (Signed)
Spoke with Caryl Pina with [facility] [employer]  PA resubmitted for Estring Rx  PA approved  ID: [id number] Approved for 1 year 06/07/2019- 06/06/2020  Pt has current Rx written on 05/29/19 at AEX appt.   Will call pt with PA approval.

## 2019-06-07 NOTE — Telephone Encounter (Signed)
Spoke with pt. Pt given update on approved PA. Pt to call [place] on file and have Rx refilled today. Pt is thankful for approval of PA.   Routing to Dr Quincy Simmonds for review and update. Encounter closed.

## 2019-06-18 ENCOUNTER — Other Ambulatory Visit: Payer: Self-pay | Admitting: Obstetrics and Gynecology

## 2019-06-18 MED ORDER — VALACYCLOVIR HCL 1 G PO TABS: ORAL_TABLET | ORAL | 3 refills | Status: DC

## 2019-06-18 NOTE — Progress Notes (Signed)
Lab results received.  Creatinine - 0.60 on 04/29/19.   Ok for Valtrex refill for one year.

## 2019-11-15 ENCOUNTER — Other Ambulatory Visit: Payer: Self-pay | Admitting: Obstetrics and Gynecology

## 2019-11-15 DIAGNOSIS — Z01419 Encounter for gynecological examination (general) (routine) without abnormal findings: Secondary | ICD-10-CM

## 2019-11-15 NOTE — Telephone Encounter (Signed)
Medication refill request: Estring 2mg   Last AEX:  05/29/19 Next AEX: 5//9/21 Last MMG (if hormonal medication request): 04/30/19  Neg  Refill authorized: 1/2

## 2020-04-17 ENCOUNTER — Other Ambulatory Visit: Payer: Self-pay | Admitting: Obstetrics and Gynecology

## 2020-04-17 DIAGNOSIS — Z01419 Encounter for gynecological examination (general) (routine) without abnormal findings: Secondary | ICD-10-CM

## 2020-04-20 ENCOUNTER — Telehealth: Payer: Self-pay | Admitting: *Deleted

## 2020-04-20 NOTE — Telephone Encounter (Signed)
[facility] sent fax stating Premarin vaginal cream is not cover by patient plan. The preferred alternative medication on plan are Yuvafem tablet, estradiol tablet mcg tablet, Imvexxy mcg. Please advise

## 2020-04-20 NOTE — Telephone Encounter (Signed)
Medication refill request: Estring Last AEX:  05-29-19 BS Next AEX: 06-01-20  Last MMG (if hormonal medication request): 04-30-19 density C/BIRADS 1 negative  Refill authorized: Today, please advise.   Medication pended for #1, 0RF. Please refill if appropriate.

## 2020-04-20 NOTE — Telephone Encounter (Addendum)
Patient voicemail full

## 2020-04-20 NOTE — Telephone Encounter (Signed)
Ok to stop Premarin cream and start Yuvafem 10 mcg pv at hs twice weekly.  Ok for refills until annual exam is due in May, 2022.

## 2020-04-23 NOTE — Telephone Encounter (Signed)
Left message for patient to call.

## 2020-04-24 NOTE — Telephone Encounter (Signed)
Patient informed with below, she reports only using a small pea size amount and has 1 tube left. She will use this and follow up at annual exam in May. Patient said if medication is affordable she may pay out of pocket for medication. I did provide her with GoodRx. website as well.

## 2020-05-26 ENCOUNTER — Encounter: Payer: Self-pay | Admitting: Obstetrics and Gynecology

## 2020-05-27 ENCOUNTER — Encounter: Payer: Self-pay | Admitting: Obstetrics and Gynecology

## 2020-05-28 ENCOUNTER — Encounter: Payer: Self-pay | Admitting: *Deleted

## 2020-05-28 ENCOUNTER — Telehealth: Payer: Self-pay | Admitting: *Deleted
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
## 2020-05-28 NOTE — Telephone Encounter (Signed)
Confirmed [facility] for 06/03/20 at 1215 .  Instructions and contact information given.  

## 2020-05-29 ENCOUNTER — Encounter: Payer: Self-pay | Admitting: *Deleted

## 2020-05-29 DIAGNOSIS — D0511 Intraductal carcinoma in situ of right breast: Secondary | ICD-10-CM | POA: Insufficient documentation

## 2020-06-01 ENCOUNTER — Ambulatory Visit: Payer: BC Managed Care – PPO | Admitting: Obstetrics and Gynecology

## 2020-06-01 ENCOUNTER — Encounter: Payer: Self-pay | Admitting: Obstetrics and Gynecology

## 2020-06-01 ENCOUNTER — Other Ambulatory Visit: Payer: Self-pay

## 2020-06-01 ENCOUNTER — Ambulatory Visit (INDEPENDENT_AMBULATORY_CARE_PROVIDER_SITE_OTHER): Payer: BC Managed Care – PPO | Admitting: Obstetrics and Gynecology

## 2020-06-01 VITALS — BP 122/70 | HR 88 | Ht 65.25 in | Wt 153.0 lb

## 2020-06-01 DIAGNOSIS — Z01419 Encounter for gynecological examination (general) (routine) without abnormal findings: Secondary | ICD-10-CM | POA: Diagnosis not present

## 2020-06-01 MED ORDER — VALACYCLOVIR HCL 1 G PO TABS
ORAL_TABLET | ORAL | 3 refills | Status: DC
Start: 2020-06-01 — End: 2021-06-03

## 2020-06-01 NOTE — Patient Instructions (Signed)

## 2020-06-03 ENCOUNTER — Inpatient Hospital Stay: Payer: BC Managed Care – PPO | Attending: Hematology | Admitting: Hematology

## 2020-06-03 ENCOUNTER — Ambulatory Visit: Payer: BC Managed Care – PPO | Admitting: Physical Therapy

## 2020-06-03 ENCOUNTER — Ambulatory Visit
Admission: RE | Admit: 2020-06-03 | Discharge: 2020-06-03 | Disposition: A | Discharge status: Home / Self Care | Payer: BC Managed Care – PPO | Source: Ambulatory Visit | Attending: Radiation Oncology | Admitting: Radiation Oncology

## 2020-06-03 ENCOUNTER — Encounter: Payer: Self-pay | Admitting: *Deleted

## 2020-06-03 ENCOUNTER — Encounter: Payer: Self-pay | Admitting: Hematology

## 2020-06-03 ENCOUNTER — Ambulatory Visit (HOSPITAL_BASED_OUTPATIENT_CLINIC_OR_DEPARTMENT_OTHER): Payer: BC Managed Care – PPO | Admitting: Genetic Counselor

## 2020-06-03 ENCOUNTER — Inpatient Hospital Stay: Payer: BC Managed Care – PPO

## 2020-06-03 ENCOUNTER — Encounter: Payer: Self-pay | Admitting: General Practice

## 2020-06-03 ENCOUNTER — Other Ambulatory Visit: Payer: Self-pay

## 2020-06-03 VITALS — BP 139/86 | HR 60 | Temp 97.5°F | Resp 15 | Ht 65.25 in | Wt 152.7 lb

## 2020-06-03 DIAGNOSIS — B009 Herpesviral infection, unspecified: Secondary | ICD-10-CM | POA: Insufficient documentation

## 2020-06-03 DIAGNOSIS — M810 Age-related osteoporosis without current pathological fracture: Secondary | ICD-10-CM | POA: Insufficient documentation

## 2020-06-03 DIAGNOSIS — Z79899 Other long term (current) drug therapy: Secondary | ICD-10-CM | POA: Diagnosis not present

## 2020-06-03 DIAGNOSIS — D0511 Intraductal carcinoma in situ of right breast: Secondary | ICD-10-CM | POA: Diagnosis not present

## 2020-06-03 DIAGNOSIS — Z170 Estrogen receptor positive status [ER+]: Secondary | ICD-10-CM | POA: Diagnosis not present

## 2020-06-03 DIAGNOSIS — Z803 Family history of malignant neoplasm of breast: Secondary | ICD-10-CM | POA: Insufficient documentation

## 2020-06-03 DIAGNOSIS — Z8052 Family history of malignant neoplasm of bladder: Secondary | ICD-10-CM | POA: Insufficient documentation

## 2020-06-03 DIAGNOSIS — Z801 Family history of malignant neoplasm of trachea, bronchus and lung: Secondary | ICD-10-CM

## 2020-06-03 DIAGNOSIS — Z8051 Family history of malignant neoplasm of kidney: Secondary | ICD-10-CM

## 2020-06-03 LAB — CMP (CANCER CENTER ONLY)
ALT: 12 U/L (ref 0–44)
AST: 11 U/L — ABNORMAL LOW (ref 15–41)
Albumin: 4.5 g/dL (ref 3.5–5.0)
Alkaline Phosphatase: 70 U/L (ref 38–126)
Anion gap: 9 (ref 5–15)
BUN: 23 mg/dL (ref 8–23)
CO2: 28 mmol/L (ref 22–32)
Calcium: 9.5 mg/dL (ref 8.9–10.3)
Chloride: 103 mmol/L (ref 98–111)
Creatinine: 0.74 mg/dL (ref 0.44–1.00)
GFR, Estimated: >60 mL/min (ref >60)
Glucose, Bld: 94 mg/dL (ref 70–99)
Potassium: 4 mmol/L (ref 3.5–5.1)
Sodium: 140 mmol/L (ref 135–145)
Total Bilirubin: 1.1 mg/dL (ref 0.3–1.2)
Total Protein: 7.1 g/dL (ref 6.5–8.1)

## 2020-06-03 LAB — CBC WITH DIFFERENTIAL (CANCER CENTER ONLY)
Abs Immature Granulocytes: 0 10*3/uL (ref 0.00–0.07)
Basophils Absolute: 0 10*3/uL (ref 0.0–0.1)
Basophils Relative: 1 %
Eosinophils Absolute: 0.1 10*3/uL (ref 0.0–0.5)
Eosinophils Relative: 1 %
HCT: 44.7 % (ref 36.0–46.0)
Hemoglobin: 14.9 g/dL (ref 12.0–15.0)
Immature Granulocytes: 0 %
Lymphocytes Relative: 25 %
Lymphs Abs: 1.4 10*3/uL (ref 0.7–4.0)
MCH: 33.4 pg (ref 26.0–34.0)
MCHC: 33.3 g/dL (ref 30.0–36.0)
MCV: 100.2 fL — ABNORMAL HIGH (ref 80.0–100.0)
Monocytes Absolute: 0.4 10*3/uL (ref 0.1–1.0)
Monocytes Relative: 8 %
Neutro Abs: 3.7 10*3/uL (ref 1.7–7.7)
Neutrophils Relative %: 65 %
Platelet Count: 236 10*3/uL (ref 150–400)
RBC: 4.46 MIL/uL (ref 3.87–5.11)
RDW: 12.3 % (ref 11.5–15.5)
WBC Count: 5.6 10*3/uL (ref 4.0–10.5)
nRBC: 0 % (ref 0.0–0.2)

## 2020-06-03 LAB — GENETIC SCREENING ORDER
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
## 2020-06-03 NOTE — Progress Notes (Signed)
Radiation Oncology         [phone] ________________________________  Name: Maureen Figueroa        MRN: [id number]  Date of Service: 06/03/2020 DOB: 1950-06-03  [place]:[id number], Christiane Ha, MD  Coralie Keens, MD     REFERRING PHYSICIAN: Coralie Keens, MD   DIAGNOSIS: The encounter diagnosis was Ductal carcinoma in situ (DCIS) of right breast.   HISTORY OF PRESENT ILLNESS: Maureen Figueroa is a 70 y.o. female seen in the multidisciplinary breast clinic for a new diagnosis of right breast cancer. The patient was noted to have screening detected calcifications in the right breast in the 12:00 position. Futher diagnostic imaging measured these at 1.5 cm in the 10-12:00 position. A biopsy showed an intermediate grade DCIS that was ER positive, PR positive. She's seen today to discuss treatment recommendations of her cancer.     PREVIOUS RADIATION THERAPY: No   PAST MEDICAL HISTORY:  Past Medical History:  Diagnosis Date  . Back injury 1994   back/neck injury   . Cancer ([place])    Breast cancer--right  . Endometriosis   . Lyme disease, unspecified    followed by PCP  . Osteoporosis   . Pinched nerve in neck 12/2013  . Postmenopausal bleeding 2016   uterine fibroid, on HRT  . STD (sexually transmitted disease)    HSV  . Urinary incontinence        PAST SURGICAL HISTORY: Past Surgical History:  Procedure Laterality Date  . BREAST SURGERY  07-23-08   benign Rt.breast biopsy--benign stromal fibrosis with calcifications:[facility]  . CATARACT EXTRACTION, BILATERAL Bilateral 11/2016, 03/2017  . EYE SURGERY Right    cyst removed x2   . LASIK    . OOPHORECTOMY  12/86  . PELVIC LAPAROSCOPY  1986   endometriosis     FAMILY HISTORY:  Family History  Problem Relation Age of Onset  . Hypertension Mother   . COPD Mother   . Hyperlipidemia Mother   . Cancer Father 73       kidney cancer  . Osteoporosis Paternal Grandmother   . Breast cancer Paternal Grandmother   .  Osteoporosis Maternal Grandfather   . Cancer Sister 73       breast cancer     SOCIAL HISTORY:  reports that she has never smoked. She has never used smokeless tobacco. She reports current alcohol use of about 14.0 standard drinks of alcohol per week. She reports that she does not use drugs. 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 She lives in [place]. She lives very close by to the cancer center.   ALLERGIES: Metaxalone, Chocolate, and Penicillins   MEDICATIONS:  Current Outpatient Medications  Medication Sig Dispense Refill  . Ascorbic Acid (VITAMIN C PO) Take 500 mg by mouth daily.     . Calcium Carb-Cholecalciferol (CALCIUM + D3) 600-200 MG-UNIT TABS 1 tablet    . carisoprodol (SOMA) 350 MG tablet Take 350 mg by mouth daily as needed for muscle spasms.     . Cholecalciferol (D3) 50 MCG (2000 UT) TABS     . doxycycline (VIBRA-TABS) 100 MG tablet Take 100 mg by mouth 2 (two) times daily.    Marland Kitchen selenium sulfide (SELSUN) 2.5 % shampoo     . tretinoin microspheres (RETIN-A MICRO) 0.1 % gel Apply 1 application topically at bedtime.     . valACYclovir (VALTREX) 1000 MG tablet TAKE 1 TABLET(1000 MG) BY MOUTH AT BEDTIME.  Take one tablet (1000 mg) by mouth  twice a day for 3 days for genital outbreak and take two tablets (2000 mg) by mouth every 12 hours for 24 hours for an oral outbreak. 110 tablet 3   No current facility-administered medications for this encounter.     REVIEW OF SYSTEMS: On review of systems, the patient reports that she is doing well since hearing more about the treatment plan. She does have loose stools which she attributes to antibiotics. She has urinary urgency and more difficulty in controlling her bladder. She does have fatigue and enjoys naps most days. No specific breast complaints are verbalized.   PHYSICAL EXAM:  Wt Readings from Last 3 Encounters:  06/01/20 153 lb (69.4 kg)  05/29/19 155 lb (70.3 kg)  11/15/17 151 lb 6.4 oz (68.7 kg)   Temp  Readings from Last 3 Encounters:  05/29/19 97.7 F (36.5 C) (Temporal)  01/01/14 98.7 F (37.1 C) (Oral)   BP Readings from Last 3 Encounters:  06/01/20 122/70  05/29/19 112/60  11/15/17 118/68   Pulse Readings from Last 3 Encounters:  06/01/20 88  05/29/19 80  11/15/17 64    In general this is a well appearing caucsian female in no acute distress. She's alert and oriented x4 and appropriate throughout the examination. Cardiopulmonary assessment is negative for acute distress and she exhibits normal effort. Bilateral breast exam is deferred.    ECOG = 0  0 - Asymptomatic (Fully active, able to carry on all predisease activities without restriction)  1 - Symptomatic but completely ambulatory (Restricted in physically strenuous activity but ambulatory and able to carry out work of a light or sedentary nature. For example, light housework, office work)  2 - Symptomatic, <50% in bed during the day (Ambulatory and capable of all self care but unable to carry out any work activities. Up and about more than 50% of waking hours)  3 - Symptomatic, >50% in bed, but not bedbound (Capable of only limited self-care, confined to bed or chair 50% or more of waking hours)  4 - Bedbound (Completely disabled. Cannot carry on any self-care. Totally confined to bed or chair)  5 - Death   Maureen Figueroa, Maureen Figueroa, Maureen Figueroa, et al. [phone]). "Toxicity and response criteria of the [employer] [facility] [employer]". [place] Oncol. 5 (6): 649-55    LABORATORY DATA:  Lab Results  Component Value Date   WBC 8.5 12/31/2013   HGB 14.6 12/31/2013   HCT 43.0 12/31/2013   MCV 98.5 12/31/2013   PLT 261 12/31/2013   Lab Results  Component Value Date   NA 145 01/01/2014   K 4.3 01/01/2014   CL 108 01/01/2014   CO2 26 01/01/2014   Lab Results  Component Value Date   ALT 19 01/01/2014   AST 12 01/01/2014   ALKPHOS 66 01/01/2014   BILITOT 1.2 01/01/2014      RADIOGRAPHY: No results found.      IMPRESSION/PLAN: 1. Intermediate grade, ER/PR positive DCIS of the right breast. Dr. Lisbeth Renshaw discusses the pathology findings and reviews the nature of noninvasive breast disease. The consensus from the breast conference includes breast conservation with lumpectomy. Dr. Lisbeth Renshaw discusses the rationale for external radiotherapy to the breast  to reduce risks of local recurrence followed by antiestrogen therapy. We discussed the risks, benefits, short, and long term effects of radiotherapy, as well as the curative intent, and the patient is interested in proceeding. Dr. Lisbeth Renshaw discusses the delivery and logistics of radiotherapy and anticipates a course of 4 weeks  of radiotherapy. We will see her back a few weeks after surgery to discuss the simulation process and anticipate we starting radiotherapy about 4-6 weeks after surgery.  2. Possible genetic predisposition to malignancy. The patient is a candidate for genetic testing given her personal and family history. She was offered referral and will meet with genetics to discuss testing.   In a visit lasting 60 minutes, greater than 50% of the time was spent face to face reviewing her case, as well as in preparation of, discussing, and coordinating the patient's care.  The above documentation reflects my direct findings during this shared patient visit. Please see the separate note by Dr. Lisbeth Renshaw on this date for the remainder of the patient's plan of care.    Carola Rhine, [facility]    **Disclaimer: This note was dictated with voice recognition software. Similar sounding words can inadvertently be transcribed and this note may contain transcription errors which may not have been corrected upon publication of note.**

## 2020-06-04 ENCOUNTER — Encounter: Payer: Self-pay | Admitting: Genetic Counselor

## 2020-06-04 DIAGNOSIS — Z801 Family history of malignant neoplasm of trachea, bronchus and lung: Secondary | ICD-10-CM | POA: Insufficient documentation

## 2020-06-04 DIAGNOSIS — Z8051 Family history of malignant neoplasm of kidney: Secondary | ICD-10-CM | POA: Insufficient documentation

## 2020-06-04 DIAGNOSIS — Z803 Family history of malignant neoplasm of breast: Secondary | ICD-10-CM | POA: Insufficient documentation

## 2020-06-04 NOTE — Progress Notes (Addendum)
REFERRING PROVIDER: Truitt Merle, MD [place],  [place] [address]  PRIMARY PROVIDER:  Lajean Manes, MD  PRIMARY REASON FOR VISIT:  1. Ductal carcinoma in situ (DCIS) of right breast   2. Family history of breast cancer   3. Family history of kidney cancer   4. Family history of lung cancer      HISTORY OF PRESENT ILLNESS:   Maureen Figueroa, a 70 y.o. female, was seen for a [place] cancer genetics consultation at the request of Dr. Burr Medico due to a personal and family history of cancer.  Maureen Figueroa presents to clinic today to discuss the possibility of a hereditary predisposition to cancer, genetic testing, and to further clarify her future cancer risks, as well as potential cancer risks for family members.   In May of 2022, at the age of 63, [name] was diagnosed with ductal carcinoma in situ of the right breast. The tumor is ER+/PR+. The treatment plan includes surgery, radiation therapy, and consideration of antiestrogen therapy.   CANCER HISTORY:  Oncology History Overview Note  Cancer Staging No matching staging information was found for the patient.    Ductal carcinoma in situ (DCIS) of right breast  05/13/2020 Mammogram   Mammogram 05/13/20 The 1.8cm grouped amophous calcifications in teh right breasr upper outer aspect middle depth 7cmfn are suspicious. A biopsy is recommended.       05/26/2020 Initial Biopsy   Diagnosis Breast, right, nmededle core biopsy, 12:00, 7cmfn -DUCTAL CARCINOMA IN SITU WITH CALCIFICATIONS.  The DCIS has intermediate nuclear grade.     05/29/2020 Initial Diagnosis   Ductal carcinoma in situ (DCIS) of right breast      RISK FACTORS:  Menarche was at age 21, then stopped until 68.  First live birth at age 21.  OCP use for  unknown  years.  Ovaries intact: unilateral oophorectomy.  Hysterectomy: no.  Menopausal status: postmenopausal.  HRT use:  31  years. Colonoscopy: yes;  7 years ago, per patient . Mammogram within the  last year: yes.   Past Medical History:  Diagnosis Date   Back injury 1994   back/neck injury    Cancer ([place])    Breast cancer--right   Endometriosis    Family history of breast cancer    Family history of kidney cancer    Family history of lung cancer    Lyme disease, unspecified    followed by PCP   Osteoporosis    Pinched nerve in neck 12/2013   Postmenopausal bleeding 2016   uterine fibroid, on HRT   STD (sexually transmitted disease)    HSV   Urinary incontinence     Past Surgical History:  Procedure Laterality Date   BREAST SURGERY  07-23-08   benign Rt.breast biopsy--benign stromal fibrosis with calcifications:[facility]   CATARACT EXTRACTION, BILATERAL Bilateral 11/2016, 03/2017   EYE SURGERY Right    cyst removed x2    LASIK     OOPHORECTOMY  12/86   PELVIC LAPAROSCOPY  1986   endometriosis    Social History   Socioeconomic History   Marital status: Legally Separated    Spouse name: Not on file   Number of children: 1   Years of education: Not on file   Highest education level: Not on file  Occupational History   Occupation: Pharmacist, hospital   Tobacco Use   Smoking status: Never Smoker   Smokeless tobacco: Never Used  Scientific laboratory technician Use: Never used  Substance and Sexual  Activity   Alcohol use: Yes    Alcohol/week: 14.0 standard drinks    Types: 14 Standard drinks or equivalent per week    Comment: daily wine or alcohol since 2020   Drug use: No   Sexual activity: Yes    Partners: Male    Birth control/protection: Post-menopausal  Other Topics Concern   Not on file  Social History Narrative   Not on file   Social Determinants of Health   Financial Resource Strain: Not on file  Food Insecurity: Not on file  Transportation Needs: Not on file  Physical Activity: Not on file  Stress: Not on file  Social Connections: Not on file     FAMILY HISTORY:  We obtained a detailed, 4-generation family history.  Significant diagnoses are listed  below: Family History  Problem Relation Age of Onset   Hypertension Mother    COPD Mother    Hyperlipidemia Mother    Kidney cancer Father 46       dx late 18s   Breast cancer Sister 54   Pancreatic cancer Maternal Aunt        dx 87s (DDT exposure)   Cancer Maternal Aunt        gastrointestinal dx 51s (DDT exposure)   Prostate cancer Maternal Uncle        (DDT exposure)   Osteoporosis Maternal Grandfather    Osteoporosis Paternal Grandmother    Breast cancer Paternal Grandmother 21       mastectomy   Lung cancer Paternal Grandfather        dx late 52s, smoker   Maureen Figueroa has one daughter (age 11). She has one brother (age 18) and one sister (age 21). Her sister had breast cancer at age 44 and had negative BRCA testing at that time (report not available for review).   Maureen Figueroa mother died at age 48 without cancer. There were two maternal aunts and two maternal uncles. One uncle had prostate cancer. One aunt had pancreatic cancer diagnosed in her 86s. The other aunt had a gastrointestinal cancer diagnosed in her 71s. She notes that these family members had DDT exposure. There is no known cancer among maternal cousins. Maureen Figueroa maternal grandparents died in their 18s without cancer.   Maureen Figueroa father died at age 4 with kidney cancer that spread to his bladder (diagnosed in his late 76s). She notes that her father worked in Scientist, water quality and in a Educational psychologist around various chemicals. There was one paternal aunt (maternal half-sister to patient's father), who did not have cancer. There is no known cancer among paternal cousins, although she has limited information about these family members. She does know that one cousin is a breast cancer advocate. Maureen Figueroa paternal grandmother died at age 42 and had a history of breast cancer at age 72. Her paternal grandfather died in his late 3s and had lung cancer diagnosed in his late 29s. She notes that this grandfather worked on the railroad  and [employer], and was also a smoker.   Maureen Figueroa is aware of previous family history of genetic testing for hereditary cancer risks in her sister approximately 10 years ago. Patient's maternal ancestors are of [place], [place], and [place] descent, as well as rumored [place] Native American descent (not confirmed). Paternal ancestors are of [place], [place], [place], and [place] descent, as well as rumored [place] Native American descent (not confirmed). Ms. Philbin reports possible Ashkenazi Jewish ancestry through her paternal grandmother. There is no known consanguinity.  GENETIC COUNSELING ASSESSMENT: Ms. Berland is a 70 y.o. female with a personal history of breast cancer and a family history of breast cancer, kidney cancer, and lung cancer, which is somewhat suggestive of a hereditary cancer syndrome and predisposition to cancer. We, therefore, discussed and recommended the following at today's visit.   DISCUSSION: We discussed that approximately 5-10% of breast cancer is hereditary, with most cases associated with the BRCA1 and BRCA2 genes. There are other genes that can be associated with hereditary breast cancer syndromes. These include ATM, CHEK2, PALB2, etc. We reviewed that those with Ashkenazi Jewish ancestry have a higher likelihood to have a BRCA mutation. We discussed that testing is beneficial for several reasons, including knowing about other cancer risks, identifying potential screening and risk-reduction options that may be appropriate, and to understand if other family members could be at risk for cancer and allow them to undergo genetic testing.  We reviewed the characteristics, features and inheritance patterns of hereditary cancer syndromes. We also discussed genetic testing, including the appropriate family members to test, the process of testing, insurance coverage and turn-around-time for results. We discussed the implications of a negative, positive and/or variant of uncertain  significant result. In order to get genetic test results in a timely manner so that Ms. Christley can use these genetic test results for surgical decisions, we recommended Ms. Osmon pursue genetic testing for the [facility]. Once complete, we recommend Ms. Boehringer pursue reflex genetic testing to the CancerNext-Expanded + RNAinsight panel.   The BRCAplus panel offered by [employer] and includes sequencing and deletion/duplication analysis for the following 8 genes: ATM, BRCA1, BRCA2, CDH1, CHEK2, PALB2, PTEN, and TP53. The CancerNext-Expanded + RNAinsight gene panel offered by [employer] and includes sequencing and rearrangement analysis for the following 77 genes: AIP, ALK, APC, ATM, AXIN2, BAP1, BARD1, BLM, BMPR1A, BRCA1, BRCA2, BRIP1, CDC73, CDH1, CDK4, CDKN1B, CDKN2A, CHEK2, CTNNA1, DICER1, FANCC, FH, FLCN, GALNT12, KIF1B, LZTR1, MAX, MEN1, MET, MLH1, MSH2, MSH3, MSH6, MUTYH, NBN, NF1, NF2, NTHL1, PALB2, PHOX2B, PMS2, POT1, PRKAR1A, PTCH1, PTEN, RAD51C, RAD51D, RB1, RECQL, RET, SDHA, SDHAF2, SDHB, SDHC, SDHD, SMAD4, SMARCA4, SMARCB1, SMARCE1, STK11, SUFU, TMEM127, TP53, TSC1, TSC2, VHL and XRCC2 (sequencing and deletion/duplication); EGFR, EGLN1, HOXB13, KIT, MITF, PDGFRA, POLD1 and POLE (sequencing only); EPCAM and GREM1 (deletion/duplication only). RNA data is routinely analyzed for use in variant interpretation for all genes.  Based on Ms. Nguyenthi's personal and family history of cancer, she meets medical criteria for genetic testing. Despite that she meets criteria, she may still have an out of pocket cost.   PLAN: After considering the risks, benefits, and limitations, Ms. Stiehl provided informed consent to pursue genetic testing and the blood sample was sent to [facility] for analysis of the BRCAplus and CancerNext-Expanded + RNAinsight panels. Results should be available within approximately one-two weeks' time, at which point they will be disclosed by telephone to Ms.  Ouellet, as will any additional recommendations warranted by these results. Ms. Prewitt will receive a summary of her genetic counseling visit and a copy of her results once available. This information will also be available in Epic.   Ms. Christenbury questions were answered to her satisfaction today. Our contact information was provided should additional questions or concerns arise. Thank you for the referral and allowing [place] to share in the care of your patient.   Clint Guy, [place], [facility] Licensed, Certified Dispensing [email or web address] Phone: [phone]  The patient was seen for a total of 30 minutes in  face-to-face genetic counseling.  This patient was discussed with Drs. Magrinat, Lindi Adie and/or Burr Medico who agrees with the above.    _______________________________________________________________________ For Office Staff:  Number of people involved in session: 1 Was an Intern/ student involved with case: no

## 2020-06-04 NOTE — Progress Notes (Signed)
[place] Psychosocial Distress Screening Spiritual Care  Met with Maureen Figueroa in [facility] to introduce [place] team/resources, reviewing distress screen per protocol.  The patient scored a 5 on the Psychosocial Distress Thermometer which indicates moderate distress. Also assessed for distress and other psychosocial needs.   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 She reports that her biggest stressor is a partner concern, rather than issues related to her diagnosis. Encouraged [place] (Patient and [facility]) programs as additional layers of support.  Follow up needed: Yes.  Will place Social Work referral per Dr Andree Elk' request and plan to follow up by phone in ca two weeks for a pastoral check-in.   [place], [place], [facility] Pager [phone] Voicemail [phone]

## 2020-06-05 ENCOUNTER — Telehealth: Payer: Self-pay | Admitting: General Practice

## 2020-06-05 NOTE — Telephone Encounter (Signed)
[place] CSW Progress Notes  Request received from [place] tor each out to patient w support/resources.  Unable to reach, left VM w our contact information and encouragement to return call to CSWs Dalene Seltzer and/or Vita Barley, [place] Worker Phone:  [phone]

## 2020-06-09 ENCOUNTER — Encounter: Payer: Self-pay | Admitting: *Deleted

## 2020-06-09 ENCOUNTER — Encounter: Payer: Self-pay | Admitting: Hematology

## 2020-06-09 ENCOUNTER — Encounter: Payer: Self-pay | Admitting: Genetic Counselor

## 2020-06-09 ENCOUNTER — Telehealth: Payer: Self-pay | Admitting: *Deleted

## 2020-06-09 ENCOUNTER — Telehealth: Payer: Self-pay | Admitting: Genetic Counselor

## 2020-06-09 NOTE — Progress Notes (Signed)
[place] Work  Clinical Social Work attempted to return patients call.  CSW unable to leave a message due to voicemail box being full.   Johnnye Lana, MSW, LCSW, OSW-C Clinical Social Worker [facility] [phone]

## 2020-06-09 NOTE — Telephone Encounter (Signed)
LVM that we received her voicemail with additional family history information and have updated her chart accordingly.

## 2020-06-09 NOTE — Telephone Encounter (Signed)
Attempted to call patient to follow up from [facility] and assess navigation needs. Was unable to leave voicemail due to it being full. Will try again at a later date.

## 2020-06-09 NOTE — Progress Notes (Signed)
[place] Work  [place] representative spoke with patient by phone to offer support and explore resources.  Patient stated her initial concerns was "working itself out", but was Patent attorney of CSW contact.  CSW and patient discussed the support team and support services at [facility].  Patient did not express any concerns related to her treatment journey, but was interested in support programs and resources provided by [facility].  Patient was agreeable to being added to the support programs mailing list.  CSW sent patient the most recent support programs mailing and added her to the monthly mailing list.  CSW encouraged patient to call with any questions or concerns.    Johnnye Lana, MSW, LCSW, OSW-C Clinical Social Worker [facility] [phone]

## 2020-06-11 ENCOUNTER — Telehealth: Payer: Self-pay | Admitting: Genetic Counselor

## 2020-06-11 ENCOUNTER — Encounter: Payer: Self-pay | Admitting: Genetic Counselor

## 2020-06-11 NOTE — Telephone Encounter (Signed)
Patient called with updates to family history - her PGM was diagnosed with breast cancer in her mid to late 50s, rather than in her 94s.

## 2020-06-14 DIAGNOSIS — Z1379 Encounter for other screening for genetic and chromosomal anomalies: Secondary | ICD-10-CM | POA: Insufficient documentation

## 2020-06-15 ENCOUNTER — Ambulatory Visit
Admission: RE | Admit: 2020-06-15 | Discharge: 2020-06-15 | Disposition: A | Discharge status: Home / Self Care | Payer: BC Managed Care – PPO | Source: Ambulatory Visit | Attending: Surgery | Admitting: Surgery

## 2020-06-15 ENCOUNTER — Telehealth: Payer: Self-pay | Admitting: Genetic Counselor

## 2020-06-15 ENCOUNTER — Encounter: Payer: Self-pay | Admitting: Genetic Counselor

## 2020-06-15 DIAGNOSIS — D0511 Intraductal carcinoma in situ of right breast: Secondary | ICD-10-CM

## 2020-06-15 MED ORDER — GADOBUTROL 1 MMOL/ML IV SOLN
6.0000 mL | Freq: Once | INTRAVENOUS | Status: AC | PRN
  Administered 2020-06-15: 6 mL via INTRAVENOUS

## 2020-06-15 NOTE — Telephone Encounter (Signed)
Revealed negative genetic testing through the [facility] panel. Discussed that we do not know why she has breast cancer or why there is cancer in the family. There could be a genetic mutation in the family that Maureen Figueroa did not inherit. There could also be a mutation in a different gene that we are not testing, or our current technology may not be able to detect certain mutations. It will therefore be important for her to stay in contact with genetics to keep up with whether additional testing may be appropriate in the future.   Additional testing through the CancerNext-Expanded + RNAinsight panel is currently pending. We will give her a call once these results are available.

## 2020-06-15 NOTE — Telephone Encounter (Signed)
LVM that her genetic test results are available and requested that she call back to discuss them.  

## 2020-06-17 ENCOUNTER — Encounter: Payer: Self-pay | Admitting: *Deleted

## 2020-06-17 ENCOUNTER — Telehealth: Payer: Self-pay | Admitting: *Deleted

## 2020-06-17 NOTE — Telephone Encounter (Signed)
Called GI for an update on breast MRI reading. Was notified waiting on prior films from [place].  Msg sent requesting prior films to be pushed if possible for review.

## 2020-06-18 ENCOUNTER — Encounter: Payer: Self-pay | Admitting: *Deleted

## 2020-06-18 ENCOUNTER — Encounter: Payer: Self-pay | Admitting: General Practice

## 2020-06-18 NOTE — Progress Notes (Signed)
[facility] Spiritual Care Note  Followed up with Dr Andree Elk by phone for spiritual and emotional support. Her partner concern has reached a little resolution, and her biggest stressor at the moment is waiting for MRI results. In the meantime, she is working hard to manage other to-do tasks, which gives a sense of movement and empowerment in the waiting. We plan to follow up by phone next week to give her a chance to process MRI results and the plan moving forward.   [place], [place], [facility] Pager [phone] Voicemail [phone]

## 2020-06-23 ENCOUNTER — Encounter: Payer: Self-pay | Admitting: *Deleted

## 2020-06-25 ENCOUNTER — Other Ambulatory Visit: Payer: Self-pay | Admitting: Surgery

## 2020-06-25 DIAGNOSIS — R921 Mammographic calcification found on diagnostic imaging of breast: Secondary | ICD-10-CM

## 2020-07-01 ENCOUNTER — Other Ambulatory Visit: Payer: Self-pay | Admitting: Radiology

## 2020-07-01 ENCOUNTER — Encounter: Payer: Self-pay | Admitting: Obstetrics and Gynecology

## 2020-07-02 ENCOUNTER — Encounter: Payer: Self-pay | Admitting: *Deleted

## 2020-07-03 ENCOUNTER — Encounter: Payer: Self-pay | Admitting: Genetic Counselor

## 2020-07-03 ENCOUNTER — Other Ambulatory Visit: Payer: Self-pay | Admitting: Surgery

## 2020-07-03 ENCOUNTER — Ambulatory Visit: Payer: Self-pay | Admitting: Genetic Counselor

## 2020-07-03 ENCOUNTER — Telehealth: Payer: Self-pay | Admitting: Genetic Counselor

## 2020-07-03 DIAGNOSIS — Z1379 Encounter for other screening for genetic and chromosomal anomalies: Secondary | ICD-10-CM

## 2020-07-03 NOTE — Progress Notes (Addendum)
HPI:  [name] was previously seen in the [place] [facility] due to a personal and family history of cancer and concerns regarding a hereditary predisposition to cancer. Please refer to our prior cancer genetics clinic note for more information regarding our discussion, assessment and recommendations, at the time. [name] recent genetic test results were disclosed to her, as were recommendations warranted by these results. These results and recommendations are discussed in more detail below.  CANCER HISTORY:  Oncology History Overview Note  Cancer Staging No matching staging information was found for the patient.    Ductal carcinoma in situ (DCIS) of right breast  05/13/2020 Mammogram   Mammogram 05/13/20 The 1.8cm grouped amophous calcifications in teh right breasr upper outer aspect middle depth 7cmfn are suspicious. A biopsy is recommended.       05/26/2020 Initial Biopsy   Diagnosis Breast, right, n[place] core biopsy, 12:00, 7cmfn -DUCTAL CARCINOMA IN SITU WITH CALCIFICATIONS.  The DCIS has intermediate nuclear grade.     05/29/2020 Initial Diagnosis   Ductal carcinoma in situ (DCIS) of right breast   06/14/2020 Genetic Testing   Negative genetic testing:  No pathogenic variants detected on the [facility] BRCAplus panel and CancerNext-Expanded + RNAinsight panel. The report dates are 06/14/2020 and 07/02/2020, respectively. A variant of uncertain significance (VUS) was detected in the DICER1 gene called p.S299L (c.896C>T).  The BRCAplus panel offered by [facility] [employer] and includes sequencing and deletion/duplication analysis for the following 8 genes: ATM, BRCA1, BRCA2, CDH1, CHEK2, PALB2, PTEN, and TP53. The CancerNext-Expanded + RNAinsight gene panel offered by [employer] and includes sequencing and rearrangement analysis for the following 77 genes: AIP, ALK, APC, ATM, AXIN2, BAP1, BARD1, BLM, BMPR1A, BRCA1, BRCA2, BRIP1, CDC73, CDH1, CDK4, CDKN1B, CDKN2A, CHEK2, CTNNA1,  DICER1, FANCC, FH, FLCN, GALNT12, KIF1B, LZTR1, MAX, MEN1, MET, MLH1, MSH2, MSH3, MSH6, MUTYH, NBN, NF1, NF2, NTHL1, PALB2, PHOX2B, PMS2, POT1, PRKAR1A, PTCH1, PTEN, RAD51C, RAD51D, RB1, RECQL, RET, SDHA, SDHAF2, SDHB, SDHC, SDHD, SMAD4, SMARCA4, SMARCB1, SMARCE1, STK11, SUFU, TMEM127, TP53, TSC1, TSC2, VHL and XRCC2 (sequencing and deletion/duplication); EGFR, EGLN1, HOXB13, KIT, MITF, PDGFRA, POLD1 and POLE (sequencing only); EPCAM and GREM1 (deletion/duplication only). RNA data is routinely analyzed for use in variant interpretation for all genes.     FAMILY HISTORY:  We obtained a detailed, 4-generation family history.  Significant diagnoses are listed below: Family History  Problem Relation Age of Onset   Hypertension Mother    COPD Mother    Hyperlipidemia Mother    Kidney cancer Father 63       dx late 74s   Breast cancer Sister 22   Pancreatic cancer Maternal Aunt        dx 34s (DDT exposure)   Cancer Maternal Aunt        gastrointestinal dx 49s (DDT exposure)   Prostate cancer Maternal Uncle        (DDT exposure)   Osteoporosis Maternal Grandfather    Osteoporosis Paternal Grandmother    Breast cancer Paternal Grandmother 50       mastectomy   Lung cancer Paternal Grandfather        dx late 71s, smoker   [name] has one daughter (age 44). She has one brother (age 73) and one sister (age 19). Her sister had breast cancer at age 10 and had negative BRCA testing at that time (report not available for review).   [name] mother died at age 9 without cancer. There were two maternal aunts and two  maternal uncles. One uncle had prostate cancer. One aunt had pancreatic cancer diagnosed in her 44s. The other aunt had a gastrointestinal cancer diagnosed in her 31s. She notes that these family members had DDT exposure. There is no known cancer among maternal cousins. [name] maternal grandparents died in their 63s without cancer.   [name] father died at age 56 with kidney  cancer that spread to his bladder (diagnosed in his late 38s). She notes that her father worked in Scientist, water quality and in a Educational psychologist around various chemicals. There was one paternal aunt (maternal half-sister to patient's father), who did not have cancer. There is no known cancer among paternal cousins, although she has limited information about these family members. She does know that one cousin is a breast cancer advocate. [name] paternal grandmother died at age 18 and had a history of breast cancer at age 77. Her paternal grandfather died in his late 33s and had lung cancer diagnosed in his late 23s. She notes that this grandfather worked on the railroad and [employer], and was also a smoker.   [name] is aware of previous family history of genetic testing for hereditary cancer risks in her sister approximately 10 years ago. Patient's maternal ancestors are of [place], [place], and [place] descent, as well as rumored [place] Native American descent (not confirmed). Paternal ancestors are of [place], [place], [place], and [place] descent, as well as rumored [place] Native American descent (not confirmed). [name] reports possible Ashkenazi Jewish ancestry through her paternal grandmother. There is no known consanguinity.  GENETIC TEST RESULTS: Genetic testing reported out on 06/14/2020 through the [facility] panel, and 07/03/2020 through the [place] + RNAinsight panel. No pathogenic variants were detected.   The BRCAplus panel offered by [employer] and includes sequencing and deletion/duplication analysis for the following 8 genes: ATM, BRCA1, BRCA2, CDH1, CHEK2, PALB2, PTEN, and TP53. The CancerNext-Expanded + RNAinsight gene panel offered by [employer] and includes sequencing and rearrangement analysis for the following 77 genes: AIP, ALK, APC, ATM, AXIN2, BAP1, BARD1, BLM, BMPR1A, BRCA1, BRCA2, BRIP1, CDC73, CDH1, CDK4, CDKN1B, CDKN2A, CHEK2, CTNNA1, DICER1, FANCC, FH,  FLCN, GALNT12, KIF1B, LZTR1, MAX, MEN1, MET, MLH1, MSH2, MSH3, MSH6, MUTYH, NBN, NF1, NF2, NTHL1, PALB2, PHOX2B, PMS2, POT1, PRKAR1A, PTCH1, PTEN, RAD51C, RAD51D, RB1, RECQL, RET, SDHA, SDHAF2, SDHB, SDHC, SDHD, SMAD4, SMARCA4, SMARCB1, SMARCE1, STK11, SUFU, TMEM127, TP53, TSC1, TSC2, VHL and XRCC2 (sequencing and deletion/duplication); EGFR, EGLN1, HOXB13, KIT, MITF, PDGFRA, POLD1 and POLE (sequencing only); EPCAM and GREM1 (deletion/duplication only). RNA data is routinely analyzed for use in variant interpretation for all genes. The test report will be scanned into EPIC and located under the Molecular Pathology section of the Results Review tab.  A portion of the result report is included below for reference.     We discussed with [name] that because current genetic testing is not perfect, it is possible there may be a gene mutation in one of these genes that current testing cannot detect, but that chance is small.  We also discussed that there could be another gene that has not yet been discovered, or that we have not yet tested, that is responsible for the cancer diagnoses in the family. It is also possible there is a hereditary cause for the cancer in the family that [name] did not inherit and therefore was not identified in her testing.  Therefore, it is important to remain in touch with cancer genetics in the future so that  we can continue to offer [name] the most up to date genetic testing.   Genetic testing did identify a variant of uncertain significance (VUS) in the DICER1 gene called p.S299L (c.896C>T). At this time, it is unknown if this variant is associated with increased cancer risk or if this is a normal finding, but most variants such as this get reclassified to being inconsequential. It should not be used to make medical management decisions. With time, we suspect the lab will determine the significance of this variant, if any. If we do learn more about it, we will try to contact  [name] to discuss it further. However, it is important to stay in touch with [place] periodically and keep the address and phone number up to date.  ADDITIONAL GENETIC TESTING: We discussed with [name] that her genetic testing was fairly extensive.  If there are genes identified to increase cancer risk that can be analyzed in the future, we would be happy to discuss and coordinate this testing at that time.    CANCER SCREENING RECOMMENDATIONS: [name] test result is considered negative (normal). This means that we have not identified a hereditary cause for her personal and family history of cancer at this time. While reassuring, this does not definitively rule out a hereditary predisposition to cancer. It is still possible that there could be genetic mutations that are undetectable by current technology. There could be genetic mutations in genes that have not been tested or identified to increase cancer risk. Therefore, it is recommended she continue to follow the cancer management and screening guidelines provided by her oncology and primary healthcare provider.   An individual's cancer risk and medical management are not determined by genetic test results alone. Overall cancer risk assessment incorporates additional factors, including personal medical history, family history, and any available genetic information that may result in a personalized plan for cancer prevention and surveillance.  RECOMMENDATIONS FOR FAMILY MEMBERS:  Individuals in this family might be at some increased risk of developing cancer, over the general population risk, simply due to the family history of cancer.  We recommended women in this family have a yearly mammogram beginning at age 51, or 55 years younger than the earliest onset of cancer, an annual clinical breast exam, and perform monthly breast self-exams. Women in this family should also have a gynecological exam as recommended by their primary provider. All family  members should be referred for colonoscopy starting at age 34.  It is also possible there is a hereditary cause for the cancer in Ms. Pagan's family that she did not inherit and therefore was not identified in her.  Based on Ms. Romain's family history, we recommended her sister, who was diagnosed with breast cancer at age 88, have updated genetic counseling and testing. Ms. Chinchilla will let us know if we can be of any assistance in coordinating genetic counseling and/or testing for this family member.   FOLLOW-UP: Lastly, we discussed with Ms. Hodge that cancer genetics is a rapidly advancing field and it is possible that new genetic tests will be appropriate for her and/or her family members in the future. We encouraged her to remain in contact with cancer genetics on an annual basis so we can update her personal and family histories and let her know of advances in cancer genetics that may benefit this family.   Our contact number was provided. Ms. Bartling questions were answered to her satisfaction, and she knows she is welcome to call us  at anytime with additional questions or concerns.   Clint Guy, MS, [facility] Genetic Counselor [email or web address] Phone: [phone]

## 2020-07-03 NOTE — Telephone Encounter (Signed)
Revealed negative genetic testing through the [facility] CancerNext-Expanded + RNAinsight panel. Reviewed that we do not know why she has breast cancer or why there is cancer in the family. There could be a genetic mutation in the family that Maureen Figueroa did not inherit. There could also be a mutation in a different gene that we are not testing, or our current technology may not be able to detect certain mutations. It will therefore be important for her to stay in contact with genetics to keep up with whether additional testing may be appropriate in the future.   A variant of uncertain significance was detected in the DICER1 gene called p.S299L (c.896C>T). Her result is still considered normal at this time and should not impact her medical management.

## 2020-07-06 ENCOUNTER — Encounter: Payer: Self-pay | Admitting: *Deleted

## 2020-07-06 ENCOUNTER — Telehealth: Payer: Self-pay | Admitting: Hematology

## 2020-07-06 ENCOUNTER — Telehealth: Payer: Self-pay

## 2020-07-06 NOTE — Telephone Encounter (Signed)
Scheduled appointment per 06/13 sch msg. Patient is aware.

## 2020-07-06 NOTE — Telephone Encounter (Signed)
Ms Kittler called with questions regarding plastic surgery.  She has also reached out to Dr Ninfa Linden.

## 2020-07-07 ENCOUNTER — Other Ambulatory Visit: Payer: Self-pay

## 2020-07-07 ENCOUNTER — Encounter: Payer: Self-pay | Admitting: Plastic Surgery

## 2020-07-07 ENCOUNTER — Telehealth: Payer: Self-pay | Admitting: *Deleted

## 2020-07-07 ENCOUNTER — Encounter: Payer: Self-pay | Admitting: *Deleted

## 2020-07-07 ENCOUNTER — Ambulatory Visit: Payer: BC Managed Care – PPO | Admitting: Plastic Surgery

## 2020-07-07 VITALS — BP 150/77 | HR 64 | Ht 66.0 in | Wt 154.4 lb

## 2020-07-07 DIAGNOSIS — D0511 Intraductal carcinoma in situ of right breast: Secondary | ICD-10-CM | POA: Diagnosis not present

## 2020-07-07 NOTE — Telephone Encounter (Signed)
Received call back from patient needing to change her appointment with Dr. Burr Medico on 8/3. She wanted a different day and time so her daughter could come with her.  R/S and confirmed appointment for 8/1 at 1pm.

## 2020-07-07 NOTE — Progress Notes (Signed)
Patient ID: Maureen Figueroa, female    DOB: 04-01-1950, 70 y.o.   MRN: [id number]   Chief Complaint  Patient presents with   consult    The patient is a 70 year old female here for a consultation for breast reconstruction.  She underwent a mammogram and was noted to have some abnormalities of the right breast.  There were no palpable masses.  The biopsies were positive for ductal carcinoma in situ of the right breast.  It was read as high-grade and concerning as it was over 5 cm in size and had atypical lobular hyperplasia.  Those were done on June 8.  She is seeing Dr. Ninfa Linden and planning on having a right mastectomy.  It does not appear she has any lymph node involvement.  Radiation and chemotherapy are so far not planned.  She does have a family history of breast cancer.  Her initial thoughts were not to undergo reconstruction by Dr. Ninfa Linden encouraged her to at least have a consultation.   Review of Systems  Constitutional: Negative.   HENT: Negative.    Eyes: Negative.   Respiratory: Negative.    Cardiovascular: Negative.   Gastrointestinal: Negative.   Endocrine: Negative.   Genitourinary: Negative.   Musculoskeletal: Negative.   Skin: Negative.   Psychiatric/Behavioral: Negative.     Past Medical History:  Diagnosis Date   Back injury 1994   back/neck injury    Cancer ([place])    Breast cancer--right   Endometriosis    Family history of breast cancer    Family history of kidney cancer    Family history of lung cancer    Lyme disease, unspecified    followed by PCP   Osteoporosis    Pinched nerve in neck 12/2013   Postmenopausal bleeding 2016   uterine fibroid, on HRT   STD (sexually transmitted disease)    HSV   Urinary incontinence     Past Surgical History:  Procedure Laterality Date   BREAST SURGERY  07-23-08   benign Rt.breast biopsy--benign stromal fibrosis with calcifications:[facility]   CATARACT EXTRACTION, BILATERAL Bilateral 11/2016, 03/2017   EYE  SURGERY Right    cyst removed x2    LASIK     OOPHORECTOMY  12/86   PELVIC LAPAROSCOPY  1986   endometriosis      Current Outpatient Medications:    Ascorbic Acid (VITAMIN C PO), Take 500 mg by mouth daily. , Disp: , Rfl:    Calcium Carb-Cholecalciferol (CALCIUM + D3) 600-200 MG-UNIT TABS, 1 tablet, Disp: , Rfl:    carisoprodol (SOMA) 350 MG tablet, Take 350 mg by mouth daily as needed for muscle spasms. , Disp: , Rfl:    Cholecalciferol (D3) 50 MCG (2000 UT) TABS, , Disp: , Rfl:    Multiple Vitamin (MULTI-VITAMIN PO), Take by mouth., Disp: , Rfl:    selenium sulfide (SELSUN) 2.5 % shampoo, , Disp: , Rfl:    tretinoin microspheres (RETIN-A MICRO) 0.1 % gel, Apply 1 application topically at bedtime. , Disp: , Rfl:    valACYclovir (VALTREX) 1000 MG tablet, TAKE 1 TABLET(1000 MG) BY MOUTH AT BEDTIME.  Take one tablet (1000 mg) by mouth twice a day for 3 days for genital outbreak and take two tablets (2000 mg) by mouth every 12 hours for 24 hours for an oral outbreak., Disp: 110 tablet, Rfl: 3   doxycycline (VIBRA-TABS) 100 MG tablet, Take 100 mg by mouth 2 (two) times daily., Disp: , Rfl:    Objective:  Vitals:   07/07/20 1250  BP: (!) 150/77  Pulse: 64  SpO2: 97%    Physical Exam Vitals and nursing note reviewed.  Constitutional:      Appearance: Normal appearance.  Cardiovascular:     Rate and Rhythm: Normal rate.     Pulses: Normal pulses.  Pulmonary:     Effort: Pulmonary effort is normal.  Abdominal:     General: Abdomen is flat. There is no distension.  Skin:    General: Skin is warm.     Capillary Refill: Capillary refill takes less than 2 seconds.     Coloration: Skin is not jaundiced.     Findings: Bruising present.  Neurological:     General: No focal deficit present.     Mental Status: She is alert and oriented to person, place, and time.  Psychiatric:        Mood and Affect: Mood normal.        Behavior: Behavior normal.    Assessment & Plan:  Ductal  carcinoma in situ (DCIS) of right breast  The options for reconstruction we explained to the patient / family for breast reconstruction.  There are two general categories of reconstruction.  We can reconstruction a breast with implants or use the patient's own tissue.  These were further discussed as listed.  Breast reconstruction is an optional procedure and eligibility depends on the full spectrum of the health of the patient and any co-morbidities.  More than one surgery is often needed to complete the reconstruction process.  The process can take three to twelve months to complete.  The breasts will not be identical due to many factors such as rib differences, shoulder asymmetry and treatments such as radiation.  The goal is to get the breasts to look normal and symmetrical in clothes.  Scars are a part of surgery and may fade some in time but will always be present under clothes.  Surgery may be an option on the non-cancer breast to achieve more symmetry.  No matter which procedure is chosen there is always the risk of complications and even failure of the body to heal.  This could result in no breast.    The options for reconstruction include:  1. Placement of a tissue expander with Acellular dermal matrix. When the expander is the desired size surgery is performed to remove the expander and place an implant.  In some cases the implant can be placed without an expander.  2. Autologous reconstruction can include using a muscle or tissue from another area of the body to create a breast.  3. Combined procedures (ie. latissismus dorsi flap) can be done with an expander / implant placed under the muscle.   The risks, benefits, scars and recovery time were discussed for each of the above. Risks include bleeding, infection, hematoma, seroma, scarring, pain, wound healing complications, flap loss, fat necrosis, capsular contracture, need for implant removal, donor site complications, bulge, hernia,  umbilical necrosis, need for urgent reoperation, and need for dressing changes.   The procedure the patient selected / that was best for the patient, was then discussed in further detail.  Total time: 45 minutes. This includes time spent with the patient during the visit as well as time spent before and after the visit reviewing the chart, documenting the encounter, making phone calls and reviewing studies.   After the lengthy discussion, the patient will likely opt for no reconstruction.  She is going to think about it some more but  does not want reconstruction now.  She might want to get more information after the mastectomy.  I also reviewed this with Dr. Ninfa Linden.  Pictures were obtained of the patient and placed in the chart with the patient's or guardian's permission.   [place], DO

## 2020-07-07 NOTE — Telephone Encounter (Signed)
Return patients call and left message on identified voicemail for a return phone call

## 2020-07-08 ENCOUNTER — Encounter: Payer: Self-pay | Admitting: Genetic Counselor

## 2020-07-08 ENCOUNTER — Encounter: Payer: Self-pay | Admitting: Hematology

## 2020-07-13 ENCOUNTER — Ambulatory Visit: Payer: BC Managed Care – PPO | Admitting: Sports Medicine

## 2020-07-13 ENCOUNTER — Other Ambulatory Visit: Payer: Self-pay

## 2020-07-13 VITALS — BP 120/64 | Ht 66.0 in | Wt 154.0 lb

## 2020-07-13 DIAGNOSIS — M7742 Metatarsalgia, left foot: Secondary | ICD-10-CM | POA: Diagnosis not present

## 2020-07-13 DIAGNOSIS — M7741 Metatarsalgia, right foot: Secondary | ICD-10-CM

## 2020-07-14 ENCOUNTER — Encounter: Payer: Self-pay | Admitting: Sports Medicine

## 2020-07-14 NOTE — Progress Notes (Signed)
   Subjective:    Patient ID: Maureen Figueroa, female    DOB: [birth year], 70 y.o.   MRN: [id number]  HPI chief complaint: Custom orthotics  Very pleasant 70 year old female comes in today requesting custom orthotics.  She has had custom orthotics made in the past but they are old.  She has a history of metatarsalgia as well as multiple toe fractures.  She states that she most recently injured her left pinky toe during COVID and has noticed that it has been a little crooked ever since.  No x-rays today.    Review of Systems As above    Objective:   Physical Exam  Well-developed, well-nourished.  No acute distress  Examination of her feet in standing position shows a fairly well-preserved longitudinal arch.  Collapse of the transverse arches bilaterally.  Does have some callus along the medial most aspect of her right MTP.  Left fifth toe does appear to be angulated laterally.  Brisk capillary refill.  Good pulses.      Assessment & Plan:   History of metatarsalgia Now rotated left fifth toe secondary to remote injury  Custom orthotics were created for her today.  She found them to be comfortable prior to leaving the office.  I provided her with the name of Dr. Radene Journey and she may see him at her convenience to see if anything can be done about her chronically malrotated left toe.  Follow-up as needed.  Patient was fitted for a : standard, cushioned, semi-rigid orthotic. The orthotic was heated and afterward the patient stood on the orthotic blank positioned on the orthotic stand. The patient was positioned in subtalar neutral position and 10 degrees of ankle dorsiflexion in a weight bearing stance. After completion of molding, a stable base was applied to the orthotic blank. The blank was ground to a stable position for weight bearing. Size: 7 Base: Blue EVA Posting: none Additional orthotic padding: Medium MT pad on the right, small MT pad on the left

## 2020-07-24 ENCOUNTER — Other Ambulatory Visit: Payer: Self-pay

## 2020-07-24 ENCOUNTER — Encounter (HOSPITAL_BASED_OUTPATIENT_CLINIC_OR_DEPARTMENT_OTHER): Payer: Self-pay | Admitting: Surgery

## 2020-07-31 MED ORDER — CHLORHEXIDINE GLUCONATE CLOTH 2 % EX PADS: 6.0000 | MEDICATED_PAD | Freq: Once | CUTANEOUS | Status: DC

## 2020-07-31 MED ORDER — ENSURE PRE-SURGERY PO LIQD: 296.0000 mL | Freq: Once | ORAL | Status: DC

## 2020-07-31 NOTE — Progress Notes (Signed)

## 2020-08-02 NOTE — H&P (Signed)
Maureen Figueroa  Location: [facility] Patient #: [id number] DOB: March 23, 1950 Undefined / Language: Cleophus Molt / Race: White Female   History of Present Illness  The patient is a 70 year old female who presents with breast cancer.  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  She underwent a biopsy of this showing ductal carcinoma in situ.  She has had a previous benign biopsy in the past.  Family history is positive for sister and grandmother with breast cancer.  He denies nipple discharge.  He is otherwise without complaints.  Her DCIS was ER and PR positive.  She has no cardiopulmonary issues and is otherwise healthy.    Past Surgical History  Breast Biopsy   Right. multiple Cataract Surgery   Bilateral. Cesarean Section - 1   Oral Surgery    Medication History  Medications Reconciled   Social History  Alcohol use   Moderate alcohol use. Illicit drug use   Prefer to discuss with provider.  Family History  Arthritis   Family Members In General. Kidney Disease   Father.  Pregnancy / Birth History  Age at menarche   10 years. Age of men[age]   [age] Contraceptive History   Oral contraceptives. Gravida   2 Length (months) of breastfeeding   3-6 Para   1 Regular periods    Other Problems  Arthritis   Bladder Problems      Review of Systems  General Present- Fatigue and Night Sweats. Not Present- Appetite Loss, Chills, Fever, Weight Gain and Weight Loss. Skin Not Present- Change in Wart/Mole, Dryness, Hives, Jaundice, New Lesions, Non-Healing Wounds, Rash and Ulcer. HEENT Present- Hearing Loss. Not Present- Earache, Hoarseness, Nose Bleed, Oral Ulcers, Ringing in the Ears, Seasonal Allergies, Sinus Pain, Sore Throat, Visual Disturbances, Wears  glasses/contact lenses and Yellow Eyes. Respiratory Not Present- Bloody sputum, Chronic Cough, Difficulty Breathing, Snoring and Wheezing. Breast Not Present- Breast Mass, Breast Pain, Nipple Discharge and Skin Changes. Gastrointestinal Not Present- Abdominal Pain, Bloating, Bloody Stool, Change in Bowel Habits, Chronic diarrhea, Constipation, Difficulty Swallowing, Excessive gas, Gets full quickly at meals, Hemorrhoids, Indigestion, Nausea, Rectal Pain and Vomiting. Female Genitourinary Present- Frequency and Urgency. Not Present- Nocturia, Painful Urination and Pelvic Pain. Musculoskeletal Present- Muscle Pain. Not Present- Back Pain, Joint Pain, Joint Stiffness, Muscle Weakness and Swelling of Extremities. Neurological Present- Numbness and Tingling. Not Present- Decreased Memory, Fainting, Headaches, Seizures, Tremor, Trouble walking and Weakness. Psychiatric Not Present- Anxiety, Bipolar, Change in Sleep Pattern, Depression, Fearful and Frequent crying. Endocrine Not Present- Cold Intolerance, Excessive Hunger, Hair Changes, Heat Intolerance, Hot flashes and New Diabetes.   Physical Exam  The physical exam findings are as follows: Note:  She appears well on exam  There is a hematoma and ecchymosis which is small in the right breast at the 12 o'clock position of the breast. There is no axillary adenopathy. The nipple areolar complex appears normal.  Lungs clear  CV RRR  Abdomen soft, nT    Assessment & Plan   DUCTAL CARCINOMA IN SITU (DCIS) OF RIGHT BREAST (D05.11)  Impression: I have reviewed the notes in the electronic medical records. I have reviewed her mammograms, ultrasound, and pathology results. We have also discussed her in detail in our multidisciplinary breast cancer conference this morning. I discussed the  diagnosis with the patient and her friend in detail. From a surgical standpoint, we discussed proceeding with breast conservation which would be via radioactive seed  guided right breast lumpectomy versus a mastectomy. Because of her family history, an MRI of her breast has been ordered. She is she is she is breast conservation, I discussed a right breast radioactive seed guided lumpectomy in detail.. We discussed the risk which includes but is not to bleeding, infection, injury to surrounding structures, the need for further surgery if margins are positive, cardiopulmonary issues, postoperative recovery, etc. She will make her final decisions after the MRI. Should she choose mastectomy, currently she does not want reconstruction.  Addendum:  She has now had 2 other areas biopsied in the right breast.  One showed further high-grade DCIS and the other showed atypical lobular hyperplasia.  The area of concern in her breast is now greater than 5 cm.  Impression: I have again reviewed her MRI and this recent biopsy results as well as the pathology. She has extensive DCIS in the right breast as well as ALH. The area of concern is greater than 5 cm in size. I discussed this with her in detail. I do not believe she is a candidate for lumpectomy given the size of the area involved as well as the smaller size of her breasts. I believe this recalls to get a cosmetic defect and I am suspicious there is further disease in the breast. I next discussed proceeding with a mastectomy. We also discussed immediate reconstruction and consultation with plastic surgery. She does not want reconstruction. We discussed mastectomy in detail. We discussed drain placement at the time of surgery. We discussed the risks of the procedure which includes but is not limited to bleeding, infection, injury to surrounding structures, seroma formation, flap necrosis, etc. She understands and wishes to proceed with surgery which will be scheduled

## 2020-08-03 ENCOUNTER — Encounter (HOSPITAL_BASED_OUTPATIENT_CLINIC_OR_DEPARTMENT_OTHER): Payer: Self-pay | Admitting: Surgery

## 2020-08-03 ENCOUNTER — Other Ambulatory Visit: Payer: Self-pay

## 2020-08-03 ENCOUNTER — Observation Stay (HOSPITAL_BASED_OUTPATIENT_CLINIC_OR_DEPARTMENT_OTHER)
Admission: RE | Admit: 2020-08-03 | Discharge: 2020-08-04 | Disposition: A | Discharge status: Home / Self Care | Payer: BC Managed Care – PPO | Attending: Surgery | Admitting: Surgery

## 2020-08-03 ENCOUNTER — Encounter (HOSPITAL_BASED_OUTPATIENT_CLINIC_OR_DEPARTMENT_OTHER)
Admission: RE | Disposition: A | Discharge status: Home / Self Care | Payer: Self-pay | Source: Home / Self Care | Attending: Surgery

## 2020-08-03 ENCOUNTER — Ambulatory Visit (HOSPITAL_BASED_OUTPATIENT_CLINIC_OR_DEPARTMENT_OTHER): Payer: BC Managed Care – PPO | Admitting: Anesthesiology

## 2020-08-03 DIAGNOSIS — D0511 Intraductal carcinoma in situ of right breast: Principal | ICD-10-CM | POA: Insufficient documentation

## 2020-08-03 DIAGNOSIS — Z803 Family history of malignant neoplasm of breast: Secondary | ICD-10-CM | POA: Insufficient documentation

## 2020-08-03 DIAGNOSIS — Z9011 Acquired absence of right breast and nipple: Secondary | ICD-10-CM

## 2020-08-03 HISTORY — PX: TOTAL MASTECTOMY: SHX6129

## 2020-08-03 SURGERY — MASTECTOMY, SIMPLE
Anesthesia: General | Site: Breast | Laterality: Right

## 2020-08-03 MED ORDER — METHOCARBAMOL 500 MG PO TABS
500.0000 mg | ORAL_TABLET | Freq: Four times a day (QID) | ORAL | Status: DC | PRN
  Administered 2020-08-03: 500 mg via ORAL
  Filled 2020-08-03: qty 1

## 2020-08-03 MED ORDER — ACETAMINOPHEN 500 MG PO TABS
1000.0000 mg | ORAL_TABLET | ORAL | Status: AC
  Administered 2020-08-03: 1000 mg via ORAL

## 2020-08-03 MED ORDER — OXYCODONE HCL 5 MG PO TABS
5.0000 mg | ORAL_TABLET | ORAL | Status: DC | PRN
  Administered 2020-08-03 – 2020-08-04 (×4): 10 mg via ORAL
  Filled 2020-08-03 (×4): qty 2

## 2020-08-03 MED ORDER — AMISULPRIDE (ANTIEMETIC) 5 MG/2ML IV SOLN: 10.0000 mg | Freq: Once | INTRAVENOUS | Status: DC | PRN

## 2020-08-03 MED ORDER — 0.9 % SODIUM CHLORIDE (POUR BTL) OPTIME
TOPICAL | Status: DC | PRN
  Administered 2020-08-03: 1000 mL

## 2020-08-03 MED ORDER — ENOXAPARIN SODIUM 40 MG/0.4ML IJ SOSY: 40.0000 mg | PREFILLED_SYRINGE | INTRAMUSCULAR | Status: DC

## 2020-08-03 MED ORDER — ONDANSETRON HCL 4 MG/2ML IJ SOLN: 4.0000 mg | Freq: Once | INTRAMUSCULAR | Status: DC | PRN

## 2020-08-03 MED ORDER — EPHEDRINE SULFATE 50 MG/ML IJ SOLN
INTRAMUSCULAR | Status: DC | PRN
  Administered 2020-08-03: 10 mg via INTRAVENOUS

## 2020-08-03 MED ORDER — TRAMADOL HCL 50 MG PO TABS: 50.0000 mg | ORAL_TABLET | Freq: Four times a day (QID) | ORAL | Status: DC | PRN

## 2020-08-03 MED ORDER — OXYCODONE HCL 5 MG PO TABS: 5.0000 mg | ORAL_TABLET | Freq: Once | ORAL | Status: DC | PRN

## 2020-08-03 MED ORDER — ONDANSETRON 4 MG PO TBDP
4.0000 mg | ORAL_TABLET | Freq: Four times a day (QID) | ORAL | Status: DC | PRN
Start: 2020-08-03 — End: 2020-08-04

## 2020-08-03 MED ORDER — LACTATED RINGERS IV SOLN: INTRAVENOUS | Status: DC

## 2020-08-03 MED ORDER — FENTANYL CITRATE (PF) 100 MCG/2ML IJ SOLN
INTRAMUSCULAR | Status: DC | PRN
  Administered 2020-08-03 (×2): 50 ug via INTRAVENOUS

## 2020-08-03 MED ORDER — ACETAMINOPHEN 500 MG PO TABS
ORAL_TABLET | ORAL | Status: AC
  Filled 2020-08-03: qty 2

## 2020-08-03 MED ORDER — MIDAZOLAM HCL 2 MG/2ML IJ SOLN
INTRAMUSCULAR | Status: AC
  Filled 2020-08-03: qty 2

## 2020-08-03 MED ORDER — PROPOFOL 10 MG/ML IV BOLUS
INTRAVENOUS | Status: AC
  Filled 2020-08-03: qty 20

## 2020-08-03 MED ORDER — CIPROFLOXACIN IN D5W 400 MG/200ML IV SOLN
INTRAVENOUS | Status: AC
  Filled 2020-08-03: qty 200

## 2020-08-03 MED ORDER — FENTANYL CITRATE (PF) 100 MCG/2ML IJ SOLN
25.0000 ug | INTRAMUSCULAR | Status: DC | PRN
  Administered 2020-08-03 (×2): 50 ug via INTRAVENOUS

## 2020-08-03 MED ORDER — OXYCODONE HCL 5 MG/5ML PO SOLN: 5.0000 mg | Freq: Once | ORAL | Status: DC | PRN

## 2020-08-03 MED ORDER — FENTANYL CITRATE (PF) 100 MCG/2ML IJ SOLN
INTRAMUSCULAR | Status: AC
  Filled 2020-08-03: qty 2

## 2020-08-03 MED ORDER — LIDOCAINE HCL (CARDIAC) PF 100 MG/5ML IV SOSY
PREFILLED_SYRINGE | INTRAVENOUS | Status: DC | PRN
  Administered 2020-08-03: 50 mg via INTRAVENOUS

## 2020-08-03 MED ORDER — ONDANSETRON HCL 4 MG/2ML IJ SOLN
INTRAMUSCULAR | Status: AC
  Filled 2020-08-03: qty 2

## 2020-08-03 MED ORDER — DIPHENHYDRAMINE HCL 50 MG/ML IJ SOLN
12.5000 mg | Freq: Four times a day (QID) | INTRAMUSCULAR | Status: DC | PRN
Start: 2020-08-03 — End: 2020-08-04

## 2020-08-03 MED ORDER — DEXAMETHASONE SODIUM PHOSPHATE 4 MG/ML IJ SOLN
INTRAMUSCULAR | Status: DC | PRN
  Administered 2020-08-03: 5 mg via INTRAVENOUS

## 2020-08-03 MED ORDER — DIPHENHYDRAMINE HCL 12.5 MG/5ML PO ELIX: 12.5000 mg | ORAL_SOLUTION | Freq: Four times a day (QID) | ORAL | Status: DC | PRN

## 2020-08-03 MED ORDER — MORPHINE SULFATE (PF) 4 MG/ML IV SOLN
1.0000 mg | INTRAVENOUS | Status: DC | PRN
Start: 2020-08-03 — End: 2020-08-04

## 2020-08-03 MED ORDER — ONDANSETRON HCL 4 MG/2ML IJ SOLN
INTRAMUSCULAR | Status: DC | PRN
  Administered 2020-08-03: 4 mg via INTRAVENOUS

## 2020-08-03 MED ORDER — PROPOFOL 10 MG/ML IV BOLUS
INTRAVENOUS | Status: DC | PRN
  Administered 2020-08-03: 150 mg via INTRAVENOUS

## 2020-08-03 MED ORDER — POTASSIUM CHLORIDE IN NACL 20-0.9 MEQ/L-% IV SOLN
INTRAVENOUS | Status: DC
  Filled 2020-08-03: qty 1000

## 2020-08-03 MED ORDER — ONDANSETRON HCL 4 MG/2ML IJ SOLN: 4.0000 mg | Freq: Four times a day (QID) | INTRAMUSCULAR | Status: DC | PRN

## 2020-08-03 MED ORDER — CIPROFLOXACIN IN D5W 400 MG/200ML IV SOLN
400.0000 mg | INTRAVENOUS | Status: AC
  Administered 2020-08-03: 400 mg via INTRAVENOUS

## 2020-08-03 MED ORDER — MIDAZOLAM HCL 5 MG/5ML IJ SOLN
INTRAMUSCULAR | Status: DC | PRN
  Administered 2020-08-03: 2 mg via INTRAVENOUS

## 2020-08-03 MED ORDER — EPHEDRINE 5 MG/ML INJ
INTRAVENOUS | Status: AC
  Filled 2020-08-03: qty 10

## 2020-08-03 SURGICAL SUPPLY — 42 items
ADH SKN CLS APL DERMABOND .7 (GAUZE/BANDAGES/DRESSINGS) ×1
APL PRP STRL LF DISP 70% ISPRP (MISCELLANEOUS) ×1
APPLIER CLIP 9.375 MED OPEN (MISCELLANEOUS) ×2
BINDER BREAST LRG (GAUZE/BANDAGES/DRESSINGS) ×2 IMPLANT
BLADE SURG 15 STRL LF DISP TIS (BLADE) ×1 IMPLANT
BLADE SURG 15 STRL SS (BLADE) ×2
CANISTER SUCT 1200ML W/VALVE (MISCELLANEOUS) ×2 IMPLANT
CHLORAPREP W/TINT 26 (MISCELLANEOUS) ×2 IMPLANT
CLIP APPLIE 9.375 MED OPEN (MISCELLANEOUS) ×1 IMPLANT
COVER BACK TABLE 60X90IN (DRAPES) ×2 IMPLANT
COVER MAYO STAND STRL (DRAPES) ×2 IMPLANT
DERMABOND ADVANCED (GAUZE/BANDAGES/DRESSINGS) ×1
DERMABOND ADVANCED .7 DNX12 (GAUZE/BANDAGES/DRESSINGS) ×1 IMPLANT
DRAIN CHANNEL 19F RND (DRAIN) ×2 IMPLANT
DRAPE LAPAROSCOPIC ABDOMINAL (DRAPES) ×2 IMPLANT
DRAPE UTILITY XL STRL (DRAPES) ×2 IMPLANT
ELECT REM PT RETURN 9FT ADLT (ELECTROSURGICAL) ×2
ELECTRODE REM PT RTRN 9FT ADLT (ELECTROSURGICAL) ×1 IMPLANT
EVACUATOR SILICONE 100CC (DRAIN) ×2 IMPLANT
GLOVE SURG ENC MOIS LTX SZ6.5 (GLOVE) ×4 IMPLANT
GLOVE SURG SIGNA 7.5 PF LTX (GLOVE) ×2 IMPLANT
GLOVE SURG UNDER POLY LF SZ7 (GLOVE) ×4 IMPLANT
GOWN STRL REUS W/ TWL LRG LVL3 (GOWN DISPOSABLE) ×2 IMPLANT
GOWN STRL REUS W/ TWL XL LVL3 (GOWN DISPOSABLE) ×1 IMPLANT
GOWN STRL REUS W/TWL LRG LVL3 (GOWN DISPOSABLE) ×4
GOWN STRL REUS W/TWL XL LVL3 (GOWN DISPOSABLE) ×2
HEMOSTAT SURGICEL 2X14 (HEMOSTASIS) IMPLANT
NEEDLE HYPO 25X1 1.5 SAFETY (NEEDLE) IMPLANT
NS IRRIG 1000ML POUR BTL (IV SOLUTION) ×2 IMPLANT
PACK BASIN DAY SURGERY FS (CUSTOM PROCEDURE TRAY) ×2 IMPLANT
PENCIL SMOKE EVACUATOR (MISCELLANEOUS) ×2 IMPLANT
PIN SAFETY STERILE (MISCELLANEOUS) ×2 IMPLANT
SLEEVE SCD COMPRESS KNEE MED (STOCKING) ×2 IMPLANT
SPONGE T-LAP 18X18 ~~LOC~~+RFID (SPONGE) ×2 IMPLANT
SUT ETHILON 3 0 PS 1 (SUTURE) ×2 IMPLANT
SUT MNCRL AB 4-0 PS2 18 (SUTURE) ×4 IMPLANT
SUT VICRYL 3-0 CR8 SH (SUTURE) ×2 IMPLANT
SYR BULB EAR ULCER 3OZ GRN STR (SYRINGE) IMPLANT
SYR CONTROL 10ML LL (SYRINGE) IMPLANT
TOWEL GREEN STERILE FF (TOWEL DISPOSABLE) ×2 IMPLANT
TUBE CONNECTING 20X1/4 (TUBING) ×2 IMPLANT
YANKAUER SUCT BULB TIP NO VENT (SUCTIONS) ×2 IMPLANT

## 2020-08-03 NOTE — Anesthesia Preprocedure Evaluation (Signed)
Anesthesia Evaluation  Patient identified by MRN, date of birth, ID band Patient awake    Reviewed: Allergy & Precautions, NPO status , Patient's Chart, lab work & pertinent test results  History of Anesthesia Complications Negative for: history of anesthetic complications  Airway Mallampati: I  TM Distance: >3 FB Neck ROM: Full    Dental  (+) Teeth Intact, Dental Advisory Given   Pulmonary neg pulmonary ROS,    Pulmonary exam normal        Cardiovascular negative cardio ROS Normal cardiovascular exam     Neuro/Psych negative neurological ROS     GI/Hepatic negative GI ROS, Neg liver ROS,   Endo/Other  negative endocrine ROS  Renal/GU negative Renal ROS  negative genitourinary   Musculoskeletal negative musculoskeletal ROS (+)   Abdominal   Peds  Hematology negative hematology ROS (+)   Anesthesia Other Findings  Right breast DCIS  Reproductive/Obstetrics                            Anesthesia Physical Anesthesia Plan  ASA: 2  Anesthesia Plan: General   Post-op Pain Management:    Induction: Intravenous  PONV Risk Score and Plan: 3 and Ondansetron, Dexamethasone, Midazolam and Treatment may vary due to age or medical condition  Airway Management Planned: LMA  Additional Equipment: None  Intra-op Plan:   Post-operative Plan: Extubation in OR  Informed Consent: I have reviewed the patients History and Physical, chart, labs and discussed the procedure including the risks, benefits and alternatives for the proposed anesthesia with the patient or authorized representative who has indicated his/her understanding and acceptance.     Dental advisory given  Plan Discussed with:   Anesthesia Plan Comments:         Anesthesia Quick Evaluation

## 2020-08-03 NOTE — Interval H&P Note (Signed)
History and Physical Interval Note:no change in H and P  08/03/2020 8:37 AM  Maureen Figueroa  has presented today for surgery, with the diagnosis of DCIS RIGHT BREAST.  The various methods of treatment have been discussed with the patient and family. After consideration of risks, benefits and other options for treatment, the patient has consented to  Procedure(s): RIGHT TOTAL MASTECTOMY (Right) as a surgical intervention.  The patient's history has been reviewed, patient examined, no change in status, stable for surgery.  I have reviewed the patient's chart and labs.  Questions were answered to the patient's satisfaction.     Coralie Keens

## 2020-08-03 NOTE — Anesthesia Procedure Notes (Signed)
Procedure Name: LMA Insertion Date/Time: 08/03/2020 10:01 AM Performed by: Bufford Spikes, CRNA Pre-anesthesia Checklist: Patient identified, Emergency Drugs available, Suction available and Patient being monitored Patient Re-evaluated:Patient Re-evaluated prior to induction Oxygen Delivery Method: Circle system utilized Preoxygenation: Pre-oxygenation with 100% oxygen Induction Type: IV induction Ventilation: Mask ventilation without difficulty LMA: LMA inserted LMA Size: 4.0 Number of attempts: 1 Airway Equipment and Method: Bite block Placement Confirmation: positive ETCO2 Tube secured with: Tape Dental Injury: Teeth and Oropharynx as per pre-operative assessment

## 2020-08-03 NOTE — Transfer of Care (Signed)
Immediate Anesthesia Transfer of Care Note  Patient: KEREN ALVERIO  Procedure(s) Performed: RIGHT TOTAL MASTECTOMY (Right: Breast)  Patient Location: PACU  Anesthesia Type:General  Level of Consciousness: awake, alert  and oriented  Airway & Oxygen Therapy: Patient Spontanous Breathing and Patient connected to nasal cannula oxygen  Post-op Assessment: Report given to RN and Post -op Vital signs reviewed and stable  Post vital signs: Reviewed and stable  Last Vitals:  Vitals Value Taken Time  BP 138/79 08/03/20 1053  Temp    Pulse 80 08/03/20 1054  Resp 13 08/03/20 1054  SpO2 100 % 08/03/20 1054  Vitals shown include unvalidated device data.  Last Pain:  Vitals:   08/03/20 0821  TempSrc: Oral  PainSc: 1          Complications: No notable events documented.

## 2020-08-03 NOTE — Anesthesia Postprocedure Evaluation (Signed)
Anesthesia Post Note  Patient: Maureen Figueroa  Procedure(s) Performed: RIGHT TOTAL MASTECTOMY (Right: Breast)     Patient location during evaluation: PACU Anesthesia Type: General Level of consciousness: awake and alert Pain management: pain level controlled Vital Signs Assessment: post-procedure vital signs reviewed and stable Respiratory status: spontaneous breathing, nonlabored ventilation and respiratory function stable Cardiovascular status: blood pressure returned to baseline and stable Postop Assessment: no apparent nausea or vomiting Anesthetic complications: no   No notable events documented.  Last Vitals:  Vitals:   08/03/20 1133 08/03/20 1150  BP: 127/79 (!) 119/99  Pulse: 65 64  Resp: 15   Temp:  36.7 C  SpO2: 96% 97%    Last Pain:  Vitals:   08/03/20 1133  TempSrc:   PainSc: Asleep                 Lidia Collum

## 2020-08-03 NOTE — Op Note (Signed)
RIGHT TOTAL MASTECTOMY  Procedure Note  Maureen Figueroa 08/03/2020   Pre-op Diagnosis: DCIS RIGHT BREAST     Post-op Diagnosis: same  Procedure(s): RIGHT TOTAL MASTECTOMY  Surgeon(s): Coralie Keens, MD Carlena Hurl, PA-C  Anesthesia: General  Staff:  Circulator: Izora Ribas, RN Scrub Person: Lorenza Burton, CST  Estimated Blood Loss: Minimal               Specimens: sent to path  Indication: This is a 70 year old female who was found on screening mammography to have abnormal calcifications measuring about 2 cm on mammogram.  She underwent a biopsy of this showing DCIS.  She then had an MRI that showed further calcifications and non-mass-like enhancement slightly greater than 5 cm in size.  2 additional MRI guided biopsies were recommended.  After discussion with the patient, she decided she wanted to proceed with a mastectomy without reconstruction  Procedure: The patient was brought to operating identifies correct patient.  She is placed upon the operating table general anesthesia was induced.  Right breast was prepped and draped in the usual sterile fashion.  I created an elliptical incision on the breast transversely incorporating the nipple areolar complex.  I then dissected down to the breast tissue with electrocautery.  I dissected the superior skin flap staying underneath the dermis removing the breast tissue going up to the level of the clavicle.  I dissected medially toward the sternum and laterally toward the edge of the pectoralis.  I then dissected the inferior skin flap staying just underneath the skin and dermis going down to the inframammary ridge.  I then remove the breast tissue from the pectoralis dissecting medial to lateral.  I then completed the mastectomy moving toward the axilla.  I marked the lateral side of the mastectomy with a suture.  The mastectomy was sent to pathology for evaluation.  We then irrigated the chest with saline.  Hemostasis was achieved  with cautery.  The flaps appeared viable.  I made a separate skin incision and placed a 19 [place] Blake drain into the wound.  This was sutured in place with a nylon suture.  We then closed the subcutaneous tissue with interrupted 3-0 Vicryl sutures and closed skin with a running 4-0 Monocryl.  Dermabond and a breast binder were then applied.  The patient tolerated the procedure well.  All the counts were correct at the end of the procedure.  The patient was then extubated in the operating room and taken in a stable condition to the recovery room.          Coralie Keens   Date: 08/03/2020  Time: 10:45 AM

## 2020-08-04 DIAGNOSIS — D0511 Intraductal carcinoma in situ of right breast: Secondary | ICD-10-CM | POA: Diagnosis not present

## 2020-08-04 MED ORDER — TRAMADOL HCL 50 MG PO TABS: 50.0000 mg | ORAL_TABLET | Freq: Four times a day (QID) | ORAL | 0 refills | Status: DC | PRN

## 2020-08-04 NOTE — Discharge Instructions (Addendum)
[facility] [phone]  MASTECTOMY: POST OP INSTRUCTIONS  Always review your discharge instruction sheet given to you by the facility where your surgery was performed. IF YOU HAVE DISABILITY OR FAMILY LEAVE FORMS, YOU MUST BRING THEM TO THE OFFICE FOR PROCESSING.   DO NOT GIVE THEM TO YOUR DOCTOR. A prescription for pain medication may be given to you upon discharge.  Take your pain medication as prescribed, if needed.  If narcotic pain medicine is not needed, then you may take acetaminophen (Tylenol) or ibuprofen (Advil) as needed. Take your usually prescribed medications unless otherwise directed. If you need a refill on your pain medication, please contact your pharmacy.  They will contact our office to request authorization.  Prescriptions will not be filled after 5pm or on week-ends. You should follow a light diet the first few days after arrival home, such as soup and crackers, etc.  Resume your normal diet the day after surgery. Most patients will experience some swelling and bruising on the chest and underarm.  Ice packs will help.  Swelling and bruising can take several days to resolve.  It is common to experience some constipation if taking pain medication after surgery.  Increasing fluid intake and taking a stool softener (such as Colace) will usually help or prevent this problem from occurring.  A mild laxative (Milk of Magnesia or Miralax) should be taken according to package instructions if there are no bowel movements after 48 hours. Unless discharge instructions indicate otherwise, leave your bandage dry and in place until your next appointment in 3-5 days.  You may take a limited sponge bath.  No tube baths or showers until the drains are removed.  You may have steri-strips (small skin tapes) in place directly over the incision.  These strips should be left on the skin for 7-10 days.  If your surgeon used skin glue on the incision, you may shower in 24 hours.   The glue will flake off over the next 2-3 weeks.  Any sutures or staples will be removed at the office during your follow-up visit. DRAINS:  If you have drains in place, it is important to keep a list of the amount of drainage produced each day in your drains.  Before leaving the hospital, you should be instructed on drain care.  Call our office if you have any questions about your drains. ACTIVITIES:  You may resume regular (light) daily activities beginning the next day--such as daily self-care, walking, climbing stairs--gradually increasing activities as tolerated.  You may have sexual intercourse when it is comfortable.  Refrain from any heavy lifting or straining until approved by your doctor. You may drive when you are no longer taking prescription pain medication, you can comfortably wear a seatbelt, and you can safely maneuver your car and apply brakes. RETURN TO WORK:  __________________________________________________________ You should see your doctor in the office for a follow-up appointment approximately 3-5 days after your surgery.  Your doctor's nurse will typically make your follow-up appointment when she calls you with your pathology report.  Expect your pathology report 2-3 business days after your surgery.  You may call to check if you do not hear from us after three days.   OTHER INSTRUCTIONS: ______________________________________________________________________________________________ ____________________________________________________________________________________________ WHEN TO CALL YOUR DOCTOR: Fever over 101.0 Nausea and/or vomiting Extreme swelling or bruising Continued bleeding from incision. Increased pain, redness, or drainage from the incision. The clinic staff is available to answer your questions during regular business hours.  Please don't hesitate   to call and ask to speak to one of the nurses for clinical concerns.  If you have a medical emergency, go to the  nearest emergency room or call 911.  A surgeon from [facility] is always on call at the hospital. [address], [address], [place], [place]  [address] ? [address] [address], [place], [place]   [address] [phone] ? [phone] ? FAX [phone] Web site: [email or web address]  About my Jackson-Pratt Bulb Drain  What is a Jackson-Pratt bulb? A Jackson-Pratt is a soft, round device used to collect drainage. It is connected to a long, thin drainage catheter, which is held in place by one or two small stiches near your surgical incision site. When the bulb is squeezed, it forms a vacuum, forcing the drainage to empty into the bulb.  Emptying the Jackson-Pratt bulb- To empty the bulb: 1. Release the plug on the top of the bulb. 2. Pour the bulb's contents into a measuring container which your nurse will provide. 3. Record the time emptied and amount of drainage. Empty the drain(s) as often as your     doctor or nurse recommends.  Date                  Time                    Amount (Drain 1)                   __________________________________________________  __________________________________________________  __________________________________________________  __________________________________________________  __________________________________________________  __________________________________________________  __________________________________________________  __________________________________________________  Squeezing the Jackson-Pratt Bulb- To squeeze the bulb: 1. Make sure the plug at the top of the bulb is open. 2. Squeeze the bulb tightly in your fist. You will hear air squeezing from the bulb. 3. Replace the plug while the bulb is squeezed. 4. Use a safety pin to attach the bulb to your clothing. This will keep the catheter from     pulling at the bulb insertion site.  When to call your doctor- Call your doctor if: Drain site becomes red, swollen or  hot. You have a fever greater than 101 degrees F. There is oozing at the drain site. Drain falls out (apply a guaze bandage over the drain hole and secure it with tape). Drainage increases daily not related to activity patterns. (You will usually have more drainage when you are active than when you are resting.) Drainage has a bad odor.  

## 2020-08-04 NOTE — Discharge Summary (Signed)
Physician Discharge Summary  Patient ID: Maureen Figueroa MRN: [id number] DOB/AGE: 1950/02/27 70 y.o.  Admit date: 08/03/2020 Discharge date: 08/04/2020  Admission Diagnoses:  Discharge Diagnoses:  Active Problems:   S/P mastectomy, right   Discharged Condition: good  Hospital Course: uneventful post op recovery.  Discharged home POD#1.  Consults: None  Significant Diagnostic Studies:   Treatments: surgery: right mastectomy  Discharge Exam: Blood pressure 134/64, pulse 78, temperature 98.6 F (37 C), resp. rate 18, height 5\' 6"  (1.676 m), weight 71.4 kg, last menstrual period 09/24/2005, SpO2 97 %. General appearance: alert and no distress Incision/Wound:mastectomy flaps viable, drain serosang  Disposition: Discharge disposition: 01-Home or Self Care        Allergies as of 08/04/2020       Reactions   Metaxalone Other (See Comments)   Chocolate Swelling   Penicillins Other (See Comments)   swelling        Medication List     TAKE these medications    Calcium + D3 600-200 MG-UNIT Tabs 1 tablet   carisoprodol 350 MG tablet Commonly known as: SOMA Take 350 mg by mouth daily as needed for muscle spasms.   D3 50 MCG (2000 UT) Tabs Generic drug: Cholecalciferol   MULTI-VITAMIN PO Take by mouth.   selenium sulfide 2.5 % shampoo Commonly known as: SELSUN   traMADol 50 MG tablet Commonly known as: ULTRAM Take 1 tablet (50 mg total) by mouth every 6 (six) hours as needed.   tretinoin microspheres 0.1 % gel Commonly known as: RETIN-A MICRO Apply 1 application topically at bedtime.   valACYclovir 1000 MG tablet Commonly known as: VALTREX TAKE 1 TABLET(1000 MG) BY MOUTH AT BEDTIME.  Take one tablet (1000 mg) by mouth twice a day for 3 days for genital outbreak and take two tablets (2000 mg) by mouth every 12 hours for 24 hours for an oral outbreak.   VITAMIN C PO Take 500 mg by mouth daily.   VITAMIN E EX Apply topically. suppository         Follow-up Information     Coralie Keens, MD. Schedule an appointment as soon as possible for a visit in 2 week(s).   Specialty: General Surgery Contact information: [address] [address] [phone]                 Signed: Coralie Keens 08/04/2020, 7:49 AM

## 2020-08-05 ENCOUNTER — Encounter (HOSPITAL_BASED_OUTPATIENT_CLINIC_OR_DEPARTMENT_OTHER): Payer: Self-pay | Admitting: Surgery

## 2020-08-05 LAB — SURGICAL PATHOLOGY

## 2020-08-06 ENCOUNTER — Encounter: Payer: Self-pay | Admitting: *Deleted

## 2020-08-24 ENCOUNTER — Other Ambulatory Visit: Payer: Self-pay

## 2020-08-24 ENCOUNTER — Encounter: Payer: Self-pay | Admitting: Hematology

## 2020-08-24 ENCOUNTER — Ambulatory Visit: Payer: BC Managed Care – PPO | Attending: Surgery | Admitting: Physical Therapy

## 2020-08-24 ENCOUNTER — Other Ambulatory Visit: Payer: Self-pay | Admitting: *Deleted

## 2020-08-24 ENCOUNTER — Inpatient Hospital Stay: Payer: BC Managed Care – PPO | Attending: Hematology | Admitting: Hematology

## 2020-08-24 VITALS — BP 131/74 | HR 61 | Temp 98.4°F | Resp 18 | Ht 66.0 in | Wt 154.8 lb

## 2020-08-24 DIAGNOSIS — Z7289 Other problems related to lifestyle: Secondary | ICD-10-CM | POA: Insufficient documentation

## 2020-08-24 DIAGNOSIS — Z8052 Family history of malignant neoplasm of bladder: Secondary | ICD-10-CM | POA: Insufficient documentation

## 2020-08-24 DIAGNOSIS — Z483 Aftercare following surgery for neoplasm: Secondary | ICD-10-CM | POA: Insufficient documentation

## 2020-08-24 DIAGNOSIS — Z803 Family history of malignant neoplasm of breast: Secondary | ICD-10-CM | POA: Insufficient documentation

## 2020-08-24 DIAGNOSIS — Z780 Asymptomatic menopausal state: Secondary | ICD-10-CM | POA: Diagnosis not present

## 2020-08-24 DIAGNOSIS — D0511 Intraductal carcinoma in situ of right breast: Secondary | ICD-10-CM

## 2020-08-24 DIAGNOSIS — M810 Age-related osteoporosis without current pathological fracture: Secondary | ICD-10-CM | POA: Insufficient documentation

## 2020-08-24 DIAGNOSIS — Z9011 Acquired absence of right breast and nipple: Secondary | ICD-10-CM | POA: Diagnosis not present

## 2020-08-24 DIAGNOSIS — Z170 Estrogen receptor positive status [ER+]: Secondary | ICD-10-CM | POA: Diagnosis not present

## 2020-08-24 DIAGNOSIS — N809 Endometriosis, unspecified: Secondary | ICD-10-CM | POA: Insufficient documentation

## 2020-08-24 DIAGNOSIS — B009 Herpesviral infection, unspecified: Secondary | ICD-10-CM | POA: Insufficient documentation

## 2020-08-24 DIAGNOSIS — R600 Localized edema: Secondary | ICD-10-CM | POA: Diagnosis present

## 2020-08-24 NOTE — Progress Notes (Signed)
Maureen Figueroa   Telephone:([phone]) [phone] Fax:([phone]) [phone]   Clinic Follow up Note   Patient Care Team: Lajean Manes, MD as PCP - General (Internal Medicine) Mauro Kaufmann, RN as Oncology Nurse Navigator Rockwell Germany, RN as Oncology Nurse Navigator Coralie Keens, MD as Consulting Physician (General Surgery) Truitt Merle, MD as Consulting Physician (Hematology) Kyung Rudd, MD as Consulting Physician (Radiation Oncology)  Date of Service:  08/24/2020  CHIEF COMPLAINT: f/u of right breast DCIS  SUMMARY OF ONCOLOGIC HISTORY: Oncology History Overview Note  Cancer Staging Ductal carcinoma in situ (DCIS) of right breast Staging form: Breast, AJCC 8th Edition - Clinical stage from 06/03/2020: Stage 0 (cTis (DCIS), cN0, cM0, ER+, PR+, HER2: Not Assessed) - Unsigned Stage prefix: Initial diagnosis Nuclear grade: G2 Laterality: Right Staged by: Pathologist and managing physician Stage used in treatment planning: Yes National guidelines used in treatment planning: Yes Type of national guideline used in treatment planning: NCCN    Ductal carcinoma in situ (DCIS) of right breast  05/13/2020 Mammogram   Impression: The 1.8cm grouped amophous calcifications in teh right breasr upper outer aspect middle depth 7cmfn are suspicious. A biopsy is recommended.    05/26/2020 Initial Biopsy   Diagnosis Breast, right, nmededle core biopsy, 12:00, 7cmfn -DUCTAL CARCINOMA IN SITU WITH CALCIFICATIONS.  The DCIS has intermediate nuclear grade.     05/29/2020 Initial Diagnosis   Ductal carcinoma in situ (DCIS) of right breast   06/14/2020 Genetic Testing   Negative genetic testing:  No pathogenic variants detected on the [facility] BRCAplus panel and CancerNext-Expanded + RNAinsight panel. The report dates are 06/14/2020 and 07/02/2020, respectively. A variant of uncertain significance (VUS) was detected in the DICER1 gene called p.S299L (c.896C>T).  The BRCAplus panel offered by [employer] and includes sequencing and deletion/duplication analysis for the following 8 genes: ATM, BRCA1, BRCA2, CDH1, CHEK2, PALB2, PTEN, and TP53. The CancerNext-Expanded + RNAinsight gene panel offered by [employer] and includes sequencing and rearrangement analysis for the following 77 genes: AIP, ALK, APC, ATM, AXIN2, BAP1, BARD1, BLM, BMPR1A, BRCA1, BRCA2, BRIP1, CDC73, CDH1, CDK4, CDKN1B, CDKN2A, CHEK2, CTNNA1, DICER1, FANCC, FH, FLCN, GALNT12, KIF1B, LZTR1, MAX, MEN1, MET, MLH1, MSH2, MSH3, MSH6, MUTYH, NBN, NF1, NF2, NTHL1, PALB2, PHOX2B, PMS2, POT1, PRKAR1A, PTCH1, PTEN, RAD51C, RAD51D, RB1, RECQL, RET, SDHA, SDHAF2, SDHB, SDHC, SDHD, SMAD4, SMARCA4, SMARCB1, SMARCE1, STK11, SUFU, TMEM127, TP53, TSC1, TSC2, VHL and XRCC2 (sequencing and deletion/duplication); EGFR, EGLN1, HOXB13, KIT, MITF, PDGFRA, POLD1 and POLE (sequencing only); EPCAM and GREM1 (deletion/duplication only). RNA data is routinely analyzed for use in variant interpretation for all genes.   06/15/2020 Imaging   Bilateral Breast MRI  IMPRESSION: 1. There is ill-defined non mass enhancement in the central right breast at 12 o'clock spanning up to 4.5 cm. Review of the outside mammogram demonstrates a group of calcifications anterior to the recently biopsied DCIS and another group slightly inferior and lateral to the recently diagnosed DCIS. Taking these 2 additional groups of calcifications into account, the total span of calcifications is 5.1 cm likely correlating with the non mass enhancement seen today. The non mass enhancement today and the additional groups of calcifications could also represent DCIS. 2. No other abnormalities in either breast.   07/01/2020 Pathology Results   Diagnosis 1. Breast, right, needle core biopsy - INTERMEDIATE TO HIGH-GRADE DUCTAL CARCINOMA IN SITU WITH FOCAL NECROSIS AND CALCIFICATIONS. SEE NOTE 2. Breast, right, needle core biopsy - ATYPICAL LOBULAR HYPERPLASIA. SEE NOTE - FIBROCYSTIC  CHANGE WITH CALCIFICATIONS  1. PROGNOSTIC INDICATORS Results: IMMUNOHISTOCHEMICAL AND MORPHOMETRIC ANALYSIS PERFORMED MANUALLY Estrogen Receptor: 95%, POSITIVE, STRONG STAINING INTENSITY Progesterone Receptor: 0%, NEGATIVE   08/03/2020 Pathology Results   FINAL MICROSCOPIC DIAGNOSIS:   A. BREAST, RIGHT, MASTECTOMY:  -  Ductal carcinoma in situ, intermediate grade, 0.9 cm  -  Margins uninvolved by carcinoma (2.0; posterior margin)  -  Lobular neoplasia (atypical lobular hyperplasia)  -  Fibrocystic changes  -  Calcifications associated with carcinoma and benign breast tissue  -  Previous biopsy site changes       CURRENT THERAPY:  Pending adjuvant raloxifene  INTERVAL HISTORY:  Maureen Figueroa is here for a follow up of DCIS. She was last seen by me in the multidisciplinary breast cancer clinic on 06/03/20. She presents to the clinic accompanied by her daughter.  She underwent right mastectomy in the interim. She reports tenderness under her arm. She notes she has recovered well from surgery.   All other systems were reviewed with the patient and are negative.  MEDICAL HISTORY:  Past Medical History:  Diagnosis Date   Back injury 1994   back/neck injury    Cancer ([place])    Breast cancer--right   Endometriosis    Family history of breast cancer    Family history of kidney cancer    Family history of lung cancer    Lyme disease, unspecified    followed by PCP   Osteoporosis    Pinched nerve in neck 12/2013   Postmenopausal bleeding 2016   uterine fibroid, on HRT   STD (sexually transmitted disease)    HSV   Urinary incontinence     SURGICAL HISTORY: Past Surgical History:  Procedure Laterality Date   BREAST SURGERY  07-23-08   benign Rt.breast biopsy--benign stromal fibrosis with calcifications:[facility]   CATARACT EXTRACTION, BILATERAL Bilateral 11/2016, 03/2017   EYE SURGERY Right    cyst removed x2    LASIK     OOPHORECTOMY  12/86   PELVIC LAPAROSCOPY  1986    endometriosis   TOTAL MASTECTOMY Right 08/03/2020   Procedure: RIGHT TOTAL MASTECTOMY;  Surgeon: Coralie Keens, MD;  Location: [facility];  Service: General;  Laterality: Right;    I have reviewed the social history and family history with the patient and they are unchanged from previous note.  ALLERGIES:  is allergic to metaxalone, chocolate, and penicillins.  MEDICATIONS:  Current Outpatient Medications  Medication Sig Dispense Refill   Ascorbic Acid (VITAMIN C PO) Take 500 mg by mouth daily.      Calcium Carb-Cholecalciferol (CALCIUM + D3) 600-200 MG-UNIT TABS 1 tablet     carisoprodol (SOMA) 350 MG tablet Take 350 mg by mouth daily as needed for muscle spasms.      Cholecalciferol (D3) 50 MCG (2000 UT) TABS      Multiple Vitamin (MULTI-VITAMIN PO) Take by mouth.     selenium sulfide (SELSUN) 2.5 % shampoo      traMADol (ULTRAM) 50 MG tablet Take 1 tablet (50 mg total) by mouth every 6 (six) hours as needed. (Patient not taking: Reported on 08/24/2020) 30 tablet 0   tretinoin microspheres (RETIN-A MICRO) 0.1 % gel Apply 1 application topically at bedtime.      valACYclovir (VALTREX) 1000 MG tablet TAKE 1 TABLET(1000 MG) BY MOUTH AT BEDTIME.  Take one tablet (1000 mg) by mouth twice a day for 3 days for genital outbreak and take two tablets (2000 mg) by mouth every 12 hours for 24 hours for an oral  outbreak. 110 tablet 3   VITAMIN E EX Apply topically. suppository     No current facility-administered medications for this visit.    PHYSICAL EXAMINATION: ECOG PERFORMANCE STATUS: 1 - Symptomatic but completely ambulatory  Vitals:   08/24/20 1316  BP: 131/74  Pulse: 61  Resp: 18  Temp: 98.4 F (36.9 C)  SpO2: 100%   Wt Readings from Last 3 Encounters:  08/24/20 154 lb 12.8 oz (70.2 kg)  08/03/20 157 lb 6.5 oz (71.4 kg)  07/13/20 154 lb (69.9 kg)     GENERAL:alert, no distress and comfortable SKIN: skin color, texture, turgor are normal, no rashes or  significant lesions EYES: normal, Conjunctiva are pink and non-injected, sclera clear NECK: supple, thyroid normal size, non-tender, without nodularity LYMPH:  no palpable lymphadenopathy in the cervical, axillary  LUNGS: clear to auscultation and percussion with normal breathing effort HEART: regular rate & rhythm and no murmurs and no lower extremity edema ABDOMEN:abdomen soft, non-tender and normal bowel sounds Musculoskeletal:no cyanosis of digits and no clubbing  NEURO: alert & oriented x 3 with fluent speech, no focal motor/sensory deficits Breasts: Breast inspection showed status post right mastectomy.  Incision has healed well, with mild soft tissue fullness around incision.  No palpable nodule.  Palpation of the left breast and axilla revealed no obvious mass that I could appreciate.   LABORATORY DATA:  I have reviewed the data as listed CBC Latest Ref Rng & Units 06/03/2020 12/31/2013 12/31/2013  WBC 4.0 - 10.5 K/uL 5.6 - 8.5  Hemoglobin 12.0 - 15.0 g/dL 14.9 14.6 13.0  Hematocrit 36.0 - 46.0 % 44.7 43.0 38.7  Platelets 150 - 400 K/uL 236 - 261     CMP Latest Ref Rng & Units 06/03/2020 01/01/2014 12/31/2013  Glucose 70 - 99 mg/dL 94 90 93  BUN 8 - 23 mg/dL $Remove'23 11 13  'Uyxmmmw$ Creatinine 0.44 - 1.00 mg/dL 0.74 0.62 0.60  Sodium 135 - 145 mmol/L 140 145 141  Potassium 3.5 - 5.1 mmol/L 4.0 4.3 3.3(L)  Chloride 98 - 111 mmol/L 103 108 100  CO2 22 - 32 mmol/L 28 26 -  Calcium 8.9 - 10.3 mg/dL 9.5 8.7 -  Total Protein 6.5 - 8.1 g/dL 7.1 6.0 -  Total Bilirubin 0.3 - 1.2 mg/dL 1.1 1.2 -  Alkaline Phos 38 - 126 U/L 70 66 -  AST 15 - 41 U/L 11(L) 12 -  ALT 0 - 44 U/L 12 19 -      RADIOGRAPHIC STUDIES: I have personally reviewed the radiological images as listed and agreed with the findings in the report. No results found.   ASSESSMENT & PLAN:  ASHANTEE DEUPREE is a 70 y.o. female with   1. Right breast DCIS, intermediate grade, ER+/PR+ -She opted to proceed with right mastectomy on  08/03/20 by Dr. Ninfa Linden. Pathology again showed intermediate grade DCIS, 0.9 cm, margins uninvolved.  I reviewed with her today. -Her DCIS was cured, any adjuvant treatment is to reduce her future risk of breast cancer.  That both DCIS and ALH are high risk for breast cancer. -She will not need postmastectomy radiation therapy. -Given her strongly positive ER and PR, I discussed the benefit of chemoprevention with antiestrogen therapy, which decrease her risk of future left breast cancer by ~40-50%.  I discussed the option of tamoxifen, raloxifene or anastrozole.  I recommend raloxifene due to her osteopenia.             --The potential side effects, which includes but  not limited to, hot flash, skin and vaginal dryness, slightly increased risk of cardiovascular disease and cataract, small risk of thrombosis, were discussed with her in great details. I gave her print out of material.  -We reviewed breast cancer surveillance after she completes treatment, Including annual mammogram, breast exam every 6-12 months. I discussed the option of additional screening with annual breast MRIs. I also discussed Abbreviated MRIs which have $400 out-of-pocket cost if insurance does not cover this. She is interested.   2. Genetics -She has family history of breast cancer (sister and PGM) and bladder cancer (Father).  -Testing 06/03/20 was negative.   3. Alcohol use -She had increased alcohol use since the pandemic in 2020, currently 2-3 glasses a day. We previously discussed reducing, and she agreed.   4. Osteopenia -She has history of osteoporosis and s/p 5 years of Fosamax, she now has osteopenia. -I discussed Tamoxifen can help strengthen her bone, compared to Aromatase inhibitor.   5. Social support -She has been separated from husband for the past 7 years. She remains married for her 1 daughter. -She currently lives with her boyfriend but is looking into ways to no longer live with him. I will refer her to  SW for assistance and resources.   6. Endometriosis, Hormonal replacement, HSV -Due to endometriosis since the birth of her daughter, she has been on birth control until menopause. She was then started on hormonal replacement. She continued until this week given breast cancer diagnosis. -She would like to remain sexually active. I recommend she not restart hormonal replacement moving forward given her ER/PR positive disease. She is agreeable. -She has HSV, on Acyclovir.      PLAN:  -I recommend adjuvant Roxiprin for 5 years.  I called into her pharmacy today, she was Maureen Figueroa months. -Survivorship clinic in 3 months -Lab and follow-up in 6 months   No problem-specific Assessment & Plan notes found for this encounter.   No orders of the defined types were placed in this encounter.  All questions were answered. The patient knows to call the clinic with any problems, questions or concerns. No barriers to learning was detected. The total time spent in the appointment was 30 minutes.     Truitt Merle, MD 08/24/2020   I, Wilburn Mylar, am acting as scribe for Truitt Merle, MD.   I have reviewed the above documentation for accuracy and completeness, and I agree with the above.

## 2020-08-24 NOTE — Therapy (Signed)
[place], [place], [address] Phone: [phone]   Fax:  [phone]  Physical Therapy Evaluation  Patient Details  Name: Maureen Figueroa MRN: [id number] Date of Birth: Nov 08, 1950 Referring Provider (PT): Dr. Coralie Keens   Encounter Date: 08/24/2020   PT End of Session - 08/24/20 1601     Visit Number 1    Number of Visits 1    PT Start Time [id number]    PT Stop Time [id number]    PT Time Calculation (min) 58 min    Activity Tolerance Patient tolerated treatment well    Behavior During Therapy [facility] for tasks assessed/performed             Past Medical History:  Diagnosis Date   Back injury 1994   back/neck injury    Cancer ([place])    Breast cancer--right   Endometriosis    Family history of breast cancer    Family history of kidney cancer    Family history of lung cancer    Lyme disease, unspecified    followed by PCP   Osteoporosis    Pinched nerve in neck 12/2013   Postmenopausal bleeding 2016   uterine fibroid, on HRT   STD (sexually transmitted disease)    HSV   Urinary incontinence     Past Surgical History:  Procedure Laterality Date   BREAST SURGERY  07-23-08   benign Rt.breast biopsy--benign stromal fibrosis with calcifications:[facility]   CATARACT EXTRACTION, BILATERAL Bilateral 11/2016, 03/2017   EYE SURGERY Right    cyst removed x2    LASIK     OOPHORECTOMY  12/86   PELVIC LAPAROSCOPY  1986   endometriosis   TOTAL MASTECTOMY Right 08/03/2020   Procedure: RIGHT TOTAL MASTECTOMY;  Surgeon: Coralie Keens, MD;  Location: [facility];  Service: General;  Laterality: Right;    There were no vitals filed for this visit.    Subjective Assessment - 08/24/20 1500     Subjective Patient was seen at the multidisciplinary medical clinic when she was initally diagnosed with breast cancer on 06/03/2020. She underwent a right mastectomy on 08/03/2020 but no lymph nodes were  removed. It is RE positive, PR negative. She will begin anti-estrogen therapy on September.    Pertinent History She underwent a right mastectomy on 08/03/2020 but no lymph nodes were removed. Osteoporosis.    Patient Stated Goals Get educated on what I can do    Currently in Pain? Yes    Pain Score 2     Pain Location Chest    Pain Orientation Right    Pain Descriptors / Indicators Tender    Pain Type Surgical pain    Pain Onset 1 to 4 weeks ago    Pain Frequency Intermittent    Aggravating Factors  Touching the chest    Pain Relieving Factors not touching it    Multiple Pain Sites No                [facility] PT Assessment - 08/24/20 0001       Assessment   Medical Diagnosis s/p right mastectomy    Referring Provider (PT) Dr. Coralie Keens    Onset Date/Surgical Date 08/03/20    Hand Dominance Right    Prior Therapy None      Precautions   Precautions Other (comment)    Precaution Comments active cancer      Restrictions   Weight Bearing Restrictions No  Balance Screen   Has the patient fallen in the past 6 months No    Has the patient had a decrease in activity level because of a fear of falling?  No    Is the patient reluctant to leave their home because of a fear of falling?  No      Home Environment   Living Environment Private residence    Living Arrangements Alone    Available Help at Discharge Family      Prior Function   Level of [place] Full time employment    Vocation Requirements Professor at [employer] I walk and dance alot      Cognition   Overall Cognitive Status Within Functional Limits for tasks assessed      Observation/Other Assessments   Observations Right chest incision is healing well; moderate edema present. Applied compression foam inside bra to reduce edema.      Posture/Postural Control   Posture/Postural Control Postural limitations    Postural Limitations Rounded Shoulders;Forward head       ROM / Strength   AROM / PROM / Strength AROM      AROM   AROM Assessment Site Shoulder    Right/Left Shoulder Right;Left    Right Shoulder Extension 80 Degrees    Right Shoulder Flexion 155 Degrees    Right Shoulder ABduction 180 Degrees    Right Shoulder Internal Rotation 80 Degrees    Right Shoulder External Rotation 76 Degrees    Left Shoulder Extension 81 Degrees    Left Shoulder Flexion 165 Degrees    Left Shoulder ABduction 180 Degrees    Left Shoulder Internal Rotation 80 Degrees    Left Shoulder External Rotation 88 Degrees                      Quick Dash - 08/24/20 0001     Open a tight or new jar Mild difficulty    Do heavy household chores (wash walls, wash floors) Mild difficulty    Carry a shopping bag or briefcase No difficulty    Wash your back Mild difficulty    Use a knife to cut food No difficulty    Recreational activities in which you take some force or impact through your arm, shoulder, or hand (golf, hammering, tennis) Mild difficulty    During the past week, to what extent has your arm, shoulder or hand problem interfered with your normal social activities with family, friends, neighbors, or groups? Slightly    During the past week, to what extent has your arm, shoulder or hand problem limited your work or other regular daily activities Slightly    Arm, shoulder, or hand pain. None    Tingling (pins and needles) in your arm, shoulder, or hand None    Difficulty Sleeping No difficulty    DASH Score 13.64 %              Objective measurements completed on examination: See above findings.               PT Education - 08/24/20 1601     Education Details Aftercare of surgery site; scar massage; use of compression foam for chest edema;importance of exercise for reducing recurrence risk    Person(s) Educated Patient;Child(ren)    Methods Explanation;Demonstration    Comprehension Returned demonstration;Verbalized understanding                  PT  Long Term Goals - 08/24/20 1606       PT LONG TERM GOAL #1   Title Patient will be educated on the importance of exercise for reducing recurrence risk, how to wear compression foam to reduce edema, and how to perform self scar massage.    Time 1    Period Days    Status Achieved                    Plan - 08/24/20 1602     Clinical Impression Statement Patient is here today to be educated on self-care and exercise s/p right mastectomy on 08/03/2020. She had DCIS in her right breast and no lymph nodes removed. She developed a seroma which was drained in her surgeon's office on 08/21/2020. She still has mild to moderate edema but reports it is significantly improved. She was given compression foam to place between her right chest and her bra to help decease post op edema. She was educated on the importance of daily cardio exercise for reducing recurrence risk and was deucated on scar massage. Otherwise, she has no PT needs at this time.    Stability/Clinical Decision Making Stable/Uncomplicated    Clinical Decision Making Low    Rehab Potential Excellent    PT Frequency One time visit    PT Treatment/Interventions ADLs/Self Care Home Management;Patient/family education    PT Next Visit Plan D/C - no PT needed    Consulted and Agree with Plan of Care Patient;Family member/caregiver    Family Member Consulted Daughter Carrolyn Meiers             Patient will benefit from skilled therapeutic intervention in order to improve the following deficits and impairments:  Postural dysfunction, Decreased scar mobility, Increased edema  Visit Diagnosis: Aftercare following surgery for neoplasm - Plan: PT plan of care cert/re-cert  Ductal carcinoma in situ (DCIS) of right breast - Plan: PT plan of care cert/re-cert  Localized edema - Plan: PT plan of care cert/re-cert     Problem List Patient Active Problem List   Diagnosis Date Noted   S/P mastectomy, right  08/03/2020   Genetic testing 06/14/2020   Family history of breast cancer    Family history of kidney cancer    Family history of lung cancer    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   Overactive bladder 11/15/2017   Dizziness 12/31/2013   Numbness 12/31/2013   Hypokalemia 12/31/2013   Numbness and tingling 12/31/2013   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

## 2020-08-25 ENCOUNTER — Other Ambulatory Visit: Payer: Self-pay | Admitting: Hematology

## 2020-08-25 DIAGNOSIS — D0511 Intraductal carcinoma in situ of right breast: Secondary | ICD-10-CM

## 2020-08-26 ENCOUNTER — Ambulatory Visit: Payer: BC Managed Care – PPO | Admitting: Hematology

## 2020-09-02 ENCOUNTER — Other Ambulatory Visit: Payer: Self-pay | Admitting: Adult Health

## 2020-09-02 ENCOUNTER — Telehealth: Payer: Self-pay | Admitting: *Deleted

## 2020-09-02 DIAGNOSIS — D0511 Intraductal carcinoma in situ of right breast: Secondary | ICD-10-CM

## 2020-09-02 NOTE — Telephone Encounter (Signed)
Returned call from patient. Left voicemail on identified VM to call back.

## 2020-10-19 ENCOUNTER — Telehealth: Payer: Self-pay | Admitting: *Deleted

## 2020-10-19 NOTE — Telephone Encounter (Signed)
Received DME Standard Written Order via of fax from [facility] requesting signature and return.  Given to provider to sign.    Order signed and faxed back to [facility] and copy scanned into the chart.//AB/CMA

## 2020-11-11 ENCOUNTER — Ambulatory Visit: Payer: Self-pay | Admitting: Physical Therapy

## 2020-11-23 ENCOUNTER — Telehealth: Payer: Self-pay | Admitting: *Deleted

## 2020-11-25 ENCOUNTER — Encounter: Payer: Self-pay | Admitting: Nurse Practitioner

## 2020-11-25 ENCOUNTER — Inpatient Hospital Stay: Payer: BC Managed Care – PPO | Attending: Hematology | Admitting: Nurse Practitioner

## 2020-11-25 ENCOUNTER — Other Ambulatory Visit: Payer: Self-pay

## 2020-11-25 VITALS — BP 147/87 | HR 77 | Temp 98.5°F | Resp 16 | Ht 66.0 in | Wt 155.2 lb

## 2020-11-25 DIAGNOSIS — Z170 Estrogen receptor positive status [ER+]: Secondary | ICD-10-CM | POA: Diagnosis not present

## 2020-11-25 DIAGNOSIS — D0511 Intraductal carcinoma in situ of right breast: Secondary | ICD-10-CM | POA: Insufficient documentation

## 2020-11-25 DIAGNOSIS — M810 Age-related osteoporosis without current pathological fracture: Secondary | ICD-10-CM | POA: Diagnosis not present

## 2020-11-25 DIAGNOSIS — Z9011 Acquired absence of right breast and nipple: Secondary | ICD-10-CM | POA: Diagnosis not present

## 2020-11-25 MED ORDER — RALOXIFENE HCL 60 MG PO TABS: 60.0000 mg | ORAL_TABLET | Freq: Every day | ORAL | 3 refills | Status: DC

## 2020-11-25 NOTE — Progress Notes (Signed)
CLINIC:  Survivorship   Patient Care Team: Lajean Manes, MD as PCP - General (Internal Medicine) Mauro Kaufmann, RN as Oncology Nurse Navigator Rockwell Germany, RN as Oncology Nurse Navigator Coralie Keens, MD as Consulting Physician (General Surgery) Truitt Merle, MD as Consulting Physician (Hematology) Kyung Rudd, MD as Consulting Physician (Radiation Oncology) Alla Feeling, NP as Nurse Practitioner (Nurse Practitioner)   REASON FOR VISIT:  Routine follow-up post-treatment for a recent history of breast cancer.  BRIEF ONCOLOGIC HISTORY:  Oncology History Overview Note  Cancer Staging Ductal carcinoma in situ (DCIS) of right breast Staging form: Breast, AJCC 8th Edition - Clinical stage from 06/03/2020: Stage 0 (cTis (DCIS), cN0, cM0, ER+, PR+, HER2: Not Assessed) - Unsigned Stage prefix: Initial diagnosis Nuclear grade: G2 Laterality: Right Staged by: Pathologist and managing physician Stage used in treatment planning: Yes National guidelines used in treatment planning: Yes Type of national guideline used in treatment planning: NCCN - Pathologic stage from 08/03/2020: Stage Unknown (pTis (DCIS), pNX, cM0, G2, ER+, PR-, HER2: Not Assessed) - Signed by Alla Feeling, NP on 11/25/2020 Histologic grading system: 3 grade system    Ductal carcinoma in situ (DCIS) of right breast  05/13/2020 Mammogram   Impression: The 1.8cm grouped amophous calcifications in teh right breasr upper outer aspect middle depth 7cmfn are suspicious. A biopsy is recommended.    05/26/2020 Initial Biopsy   Diagnosis Breast, right, nmededle core biopsy, 12:00, 7cmfn -DUCTAL CARCINOMA IN SITU WITH CALCIFICATIONS.  The DCIS has intermediate nuclear grade.     05/29/2020 Initial Diagnosis   Ductal carcinoma in situ (DCIS) of right breast   06/14/2020 Genetic Testing   Negative genetic testing:  No pathogenic variants detected on the [facility] BRCAplus panel and CancerNext-Expanded + RNAinsight  panel. The report dates are 06/14/2020 and 07/02/2020, respectively. A variant of uncertain significance (VUS) was detected in the DICER1 gene called p.S299L (c.896C>T).  The BRCAplus panel offered by [employer] and includes sequencing and deletion/duplication analysis for the following 8 genes: ATM, BRCA1, BRCA2, CDH1, CHEK2, PALB2, PTEN, and TP53. The CancerNext-Expanded + RNAinsight gene panel offered by [employer] and includes sequencing and rearrangement analysis for the following 77 genes: AIP, ALK, APC, ATM, AXIN2, BAP1, BARD1, BLM, BMPR1A, BRCA1, BRCA2, BRIP1, CDC73, CDH1, CDK4, CDKN1B, CDKN2A, CHEK2, CTNNA1, DICER1, FANCC, FH, FLCN, GALNT12, KIF1B, LZTR1, MAX, MEN1, MET, MLH1, MSH2, MSH3, MSH6, MUTYH, NBN, NF1, NF2, NTHL1, PALB2, PHOX2B, PMS2, POT1, PRKAR1A, PTCH1, PTEN, RAD51C, RAD51D, RB1, RECQL, RET, SDHA, SDHAF2, SDHB, SDHC, SDHD, SMAD4, SMARCA4, SMARCB1, SMARCE1, STK11, SUFU, TMEM127, TP53, TSC1, TSC2, VHL and XRCC2 (sequencing and deletion/duplication); EGFR, EGLN1, HOXB13, KIT, MITF, PDGFRA, POLD1 and POLE (sequencing only); EPCAM and GREM1 (deletion/duplication only). RNA data is routinely analyzed for use in variant interpretation for all genes.   06/15/2020 Imaging   Bilateral Breast MRI  IMPRESSION: 1. There is ill-defined non mass enhancement in the central right breast at 12 o'clock spanning up to 4.5 cm. Review of the outside mammogram demonstrates a group of calcifications anterior to the recently biopsied DCIS and another group slightly inferior and lateral to the recently diagnosed DCIS. Taking these 2 additional groups of calcifications into account, the total span of calcifications is 5.1 cm likely correlating with the non mass enhancement seen today. The non mass enhancement today and the additional groups of calcifications could also represent DCIS. 2. No other abnormalities in either breast.   07/01/2020 Pathology Results   Diagnosis 1. Breast, right, needle core  biopsy -  INTERMEDIATE TO HIGH-GRADE DUCTAL CARCINOMA IN SITU WITH FOCAL NECROSIS AND CALCIFICATIONS. SEE NOTE 2. Breast, right, needle core biopsy - ATYPICAL LOBULAR HYPERPLASIA. SEE NOTE - FIBROCYSTIC CHANGE WITH CALCIFICATIONS  1. PROGNOSTIC INDICATORS Results: IMMUNOHISTOCHEMICAL AND MORPHOMETRIC ANALYSIS PERFORMED MANUALLY Estrogen Receptor: 95%, POSITIVE, STRONG STAINING INTENSITY Progesterone Receptor: 0%, NEGATIVE   08/03/2020 Pathology Results   FINAL MICROSCOPIC DIAGNOSIS:   A. BREAST, RIGHT, MASTECTOMY:  -  Ductal carcinoma in situ, intermediate grade, 0.9 cm  -  Margins uninvolved by carcinoma (2.0; posterior margin)  -  Lobular neoplasia (atypical lobular hyperplasia)  -  Fibrocystic changes  -  Calcifications associated with carcinoma and benign breast tissue  -  Previous biopsy site changes    08/03/2020 Cancer Staging   Staging form: Breast, AJCC 8th Edition - Pathologic stage from 08/03/2020: Stage Unknown (pTis (DCIS), pNX, cM0, G2, ER+, PR-, HER2: Not Assessed) - Signed by Alla Feeling, NP on 11/25/2020 Histologic grading system: 3 grade system    11/25/2020 Survivorship   SCP delivered by Cira Rue, NP   11/25/2020 -  Anti-estrogen oral therapy   Begin Raloxifene 60 mg po once daily, goal 5 years     INTERVAL HISTORY:  Ms. Dlugosz presents to the [facility] today for our initial meeting to review her survivorship care plan detailing her treatment course for breast cancer, as well as monitoring long-term side effects of that treatment, education regarding health maintenance, screening, and overall wellness and health promotion.     Overall, Ms. Tyer reports feeling quite well except a mild cold she has had for the last 4-5 days with runny nose and cough, COVID tests negative x3.  She recovered very well from surgery.  She feels lucky and somewhat guilty to have had an uneventful breast cancer experience.  She drove to and from [place] without a  breast prosthesis and her posture and alignment were off.  She has since been working with [employer] for a prosthesis.  She has resumed exercise and planking, posture feels much better.  Denies bone or joint pain.  She never got the prescription for raloxifene at last visit.  ONCOLOGY TREATMENT TEAM:  1. Surgeon:  Dr. Ninfa Linden at [facility] 2. Medical Oncologist: Dr. Burr Medico    PAST MEDICAL/SURGICAL HISTORY:  Past Medical History:  Diagnosis Date   Back injury 1994   back/neck injury    Cancer ([place])    Breast cancer--right   Endometriosis    Family history of breast cancer    Family history of kidney cancer    Family history of lung cancer    Lyme disease, unspecified    followed by PCP   Osteoporosis    Pinched nerve in neck 12/2013   Postmenopausal bleeding 2016   uterine fibroid, on HRT   STD (sexually transmitted disease)    HSV   Urinary incontinence    Past Surgical History:  Procedure Laterality Date   BREAST SURGERY  07-23-08   benign Rt.breast biopsy--benign stromal fibrosis with calcifications:[facility]   CATARACT EXTRACTION, BILATERAL Bilateral 11/2016, 03/2017   EYE SURGERY Right    cyst removed x2    LASIK     OOPHORECTOMY  12/86   PELVIC LAPAROSCOPY  1986   endometriosis   TOTAL MASTECTOMY Right 08/03/2020   Procedure: RIGHT TOTAL MASTECTOMY;  Surgeon: Coralie Keens, MD;  Location: [facility];  Service: General;  Laterality: Right;     ALLERGIES:  Allergies  Allergen Reactions   Metaxalone Other (See  Comments)   Chocolate Swelling   Penicillins Other (See Comments)    swelling     CURRENT MEDICATIONS:  Outpatient Encounter Medications as of 11/25/2020  Medication Sig   raloxifene (EVISTA) 60 MG tablet Take 1 tablet (60 mg total) by mouth daily.   Ascorbic Acid (VITAMIN C PO) Take 500 mg by mouth daily.    Calcium Carb-Cholecalciferol (CALCIUM + D3) 600-200 MG-UNIT TABS 1 tablet   carisoprodol (SOMA) 350 MG tablet Take  350 mg by mouth daily as needed for muscle spasms.    Cholecalciferol (D3) 50 MCG (2000 UT) TABS    Multiple Vitamin (MULTI-VITAMIN PO) Take by mouth.   selenium sulfide (SELSUN) 2.5 % shampoo    traMADol (ULTRAM) 50 MG tablet Take 1 tablet (50 mg total) by mouth every 6 (six) hours as needed. (Patient not taking: Reported on 08/24/2020)   tretinoin microspheres (RETIN-A MICRO) 0.1 % gel Apply 1 application topically at bedtime.    valACYclovir (VALTREX) 1000 MG tablet TAKE 1 TABLET(1000 MG) BY MOUTH AT BEDTIME.  Take one tablet (1000 mg) by mouth twice a day for 3 days for genital outbreak and take two tablets (2000 mg) by mouth every 12 hours for 24 hours for an oral outbreak.   VITAMIN E EX Apply topically. suppository   No facility-administered encounter medications on file as of 11/25/2020.     ONCOLOGIC FAMILY HISTORY:  Family History  Problem Relation Age of Onset   Hypertension Mother    COPD Mother    Hyperlipidemia Mother    Kidney cancer Father 30       dx late 29s   Breast cancer Sister 56   Pancreatic cancer Maternal Aunt        dx 31s (DDT exposure)   Cancer Maternal Aunt        gastrointestinal dx 49s (DDT exposure)   Prostate cancer Maternal Uncle        (DDT exposure)   Osteoporosis Maternal Grandfather    Osteoporosis Paternal Grandmother    Breast cancer Paternal Grandmother 31       mastectomy   Lung cancer Paternal Grandfather        dx late 62s, smoker     GENETIC COUNSELING/TESTING: Yes,  negative on 06/03/2020  SOCIAL HISTORY:  MYRAH STRAWDERMAN is working full-time, she has a significant other.  She is very active.     PHYSICAL EXAMINATION:  Vital Signs:   Vitals:   11/25/20 1257  BP: (!) 147/87  Pulse: 77  Resp: 16  Temp: 98.5 F (36.9 C)  SpO2: 96%   Filed Weights   11/25/20 1257  Weight: 155 lb 3.2 oz (70.4 kg)   General: Well-nourished, well-appearing female in no acute distress.   HEENT: Sclerae anicteric.  Lymph: No cervical,  supraclavicular, or infraclavicular lymphadenopathy noted on palpation.  Respiratory:  breathing non-labored.  Neuro: No focal deficits. Steady gait.  Psych: Mood and affect normal and appropriate for situation.  Extremities: No edema. MSK: No focal spinal tenderness to palpation.  Full range of motion in bilateral upper extremities Skin: Warm and dry. Breast exam: No left nipple discharge or inversion.  S/p right mastectomy, incision completely healed with some prominent scar tissue at the medial edge.  No palpable mass or nodularity in the right chest wall, left breast, or either axilla that I could appreciate.  LABORATORY DATA:  None for this visit.  DIAGNOSTIC IMAGING:  None for this visit.      ASSESSMENT AND PLAN:  Ms.. Pinkley is a pleasant 70 y.o. 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 She will proceed with anti-estrogen therapy with raloxifene beginning in 11/2020.  She presents to the Survivorship Clinic for our initial meeting and routine follow-up post-completion of treatment for breast cancer.    1. 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 Hostler has recovered well from definitive treatment for breast cancer. She will follow-up with her medical oncologist, Dr. Burr Medico in 02/2020 with history and physical exam per surveillance protocol.  She will begin her anti-estrogen therapy with raloxifene. She was instructed to make Dr. Burr Medico or myself aware if she begins to experience any side effects of the medication and I could see her back in clinic to help manage those side effects, as needed.  Breast exam is benign, no clinical concern for recurrence.  Today, a comprehensive survivorship care plan and treatment summary was reviewed with the patient today detailing her breast cancer diagnosis, treatment course, potential late/long-term effects of treatment, appropriate follow-up care with recommendations for the future, and  patient education resources.  A copy of this summary, along with a letter will be sent to the patient's primary care provider via In Basket message after today's visit.    2. Bone health:  Given Ms. Krempasky's age/history of osteoporosis and osteopenia, her bone density should be monitored.  We reviewed the bone strengthening quality of raloxifene.  She can proceed with DEXA for new baseline, the order was placed today.  In the meantime, she was encouraged to increase her consumption of foods rich in calcium, as well as increase her weight-bearing activities.  She was given education on specific activities to promote bone health.  3. Cancer screening:  Due to Ms. Peery's history and her age, she should receive screening for skin cancers, colon cancer, and gynecologic cancers.  The information and recommendations are listed on the patient's comprehensive care plan/treatment summary and were reviewed in detail with the patient.    4. Health maintenance and wellness promotion: Ms. Spare was encouraged to consume 5-7 servings of fruits and vegetables per day. We reviewed the "Nutrition Rainbow" handout. She was also encouraged to engage in moderate to vigorous exercise for 30 minutes per day most days of the week. She was instructed to limit her alcohol consumption and continue to abstain from tobacco use   5. Support services/counseling: It is not uncommon for this period of the patient's cancer care trajectory to be one of many emotions and stressors, she specifically feels some guilt about her overall good prognosis.  She was offered support today through active listening and expressive supportive counseling.  She was given information regarding our available services and encouraged to contact me with any questions or for help enrolling in any of our support group/programs.    Dispo:   -Return to cancer center 02/2021 -Left mammogram due in 05/2021 -Follow up with surgery 03/2021 -Begin raloxifene -DEXA in  the next few months -She is welcome to return back to the Survivorship Clinic at any time; no additional follow-up needed at this time.  -Consider referral back to survivorship as a long-term survivor for continued surveillance  A total of (30) minutes of face-to-face time was spent with this patient with greater than 50% of that time in counseling and care-coordination.   Cira Rue, NP Survivorship Program [facility] [phone]   Note: PRIMARY CARE PROVIDER Lajean Manes, [place] [phone]

## 2020-12-10 ENCOUNTER — Encounter: Payer: Self-pay | Admitting: Hematology

## 2020-12-11 ENCOUNTER — Other Ambulatory Visit: Payer: Self-pay

## 2020-12-11 NOTE — Telephone Encounter (Signed)
Pt's EVISTA has been refilled already by Cira Rue, NP on 11/25/2020.

## 2020-12-16 ENCOUNTER — Encounter: Payer: Self-pay | Admitting: Nurse Practitioner

## 2020-12-21 ENCOUNTER — Ambulatory Visit: Payer: Self-pay | Admitting: Physical Therapy

## 2020-12-23 ENCOUNTER — Ambulatory Visit: Payer: Self-pay | Admitting: Physical Therapy

## 2020-12-29 ENCOUNTER — Ambulatory Visit: Payer: BC Managed Care – PPO | Admitting: Sports Medicine

## 2020-12-29 VITALS — BP 124/80 | Ht 65.5 in | Wt 155.0 lb

## 2020-12-29 DIAGNOSIS — M7742 Metatarsalgia, left foot: Secondary | ICD-10-CM

## 2020-12-29 DIAGNOSIS — M7741 Metatarsalgia, right foot: Secondary | ICD-10-CM

## 2020-12-30 NOTE — Progress Notes (Signed)
Patient ID: Maureen Figueroa, female   DOB: October 25, 1950, 70 y.o.   MRN: [id number]  [place] presents today requesting new custom orthotics.  She has a history of metatarsalgia.  Last set of custom orthotics were made in June of this year.  She would like to try a larger metatarsal pad on the right orthotic and believes that she may need a slight heel lift here as well..  In addition to her new custom orthotics, we replaced the medial metatarsal pad on her current right orthotic with a large.  A 3/16 inch heel lift was also added to the current right orthotic.  In addition, we also provided her with a pair of green sports insoles, both with medium metatarsal pads, that she may use for other shoes.  Follow-up as needed.  Patient was fitted for a : standard, cushioned, semi-rigid orthotic. The orthotic was heated and afterward the patient stood on the orthotic blank positioned on the orthotic stand. The patient was positioned in subtalar neutral position and 10 degrees of ankle dorsiflexion in a weight bearing stance. After completion of molding, a stable base was applied to the orthotic blank. The blank was ground to a stable position for weight bearing. Size: 7 Base: Blue EVA Posting: none Additional orthotic padding: Large MT pad on the right, medium MT pad on the left.  3/16 inch heel lift on the right.  She found the orthotics to be comfortable prior to leaving the office.  Gait was neutral with orthotics in place.  This note was dictated using Dragon naturally speaking software and may contain errors in syntax, spelling, or content which have not been identified prior to signing this note.

## 2021-02-24 ENCOUNTER — Other Ambulatory Visit: Payer: Self-pay

## 2021-02-24 ENCOUNTER — Encounter: Payer: Self-pay | Admitting: Hematology

## 2021-02-24 ENCOUNTER — Inpatient Hospital Stay: Payer: BC Managed Care – PPO | Attending: Hematology | Admitting: Hematology

## 2021-02-24 ENCOUNTER — Inpatient Hospital Stay: Payer: BC Managed Care – PPO

## 2021-02-24 VITALS — BP 149/80 | HR 79 | Temp 98.4°F | Resp 18 | Wt 157.9 lb

## 2021-02-24 DIAGNOSIS — M810 Age-related osteoporosis without current pathological fracture: Secondary | ICD-10-CM | POA: Diagnosis not present

## 2021-02-24 DIAGNOSIS — D0511 Intraductal carcinoma in situ of right breast: Secondary | ICD-10-CM

## 2021-02-24 DIAGNOSIS — Z170 Estrogen receptor positive status [ER+]: Secondary | ICD-10-CM | POA: Insufficient documentation

## 2021-02-24 DIAGNOSIS — Z8052 Family history of malignant neoplasm of bladder: Secondary | ICD-10-CM | POA: Diagnosis not present

## 2021-02-24 DIAGNOSIS — N809 Endometriosis, unspecified: Secondary | ICD-10-CM | POA: Insufficient documentation

## 2021-02-24 DIAGNOSIS — Z803 Family history of malignant neoplasm of breast: Secondary | ICD-10-CM | POA: Diagnosis not present

## 2021-02-24 DIAGNOSIS — F1090 Alcohol use, unspecified, uncomplicated: Secondary | ICD-10-CM | POA: Diagnosis not present

## 2021-02-24 DIAGNOSIS — Z1231 Encounter for screening mammogram for malignant neoplasm of breast: Secondary | ICD-10-CM

## 2021-02-24 DIAGNOSIS — Z79810 Long term (current) use of selective estrogen receptor modulators (SERMs): Secondary | ICD-10-CM | POA: Insufficient documentation

## 2021-02-24 DIAGNOSIS — B009 Herpesviral infection, unspecified: Secondary | ICD-10-CM | POA: Diagnosis not present

## 2021-02-24 DIAGNOSIS — Z801 Family history of malignant neoplasm of trachea, bronchus and lung: Secondary | ICD-10-CM | POA: Insufficient documentation

## 2021-02-24 LAB — CMP (CANCER CENTER ONLY)
ALT: 14 U/L (ref 0–44)
AST: 11 U/L — ABNORMAL LOW (ref 15–41)
Albumin: 4.2 g/dL (ref 3.5–5.0)
Alkaline Phosphatase: 60 U/L (ref 38–126)
Anion gap: 7 (ref 5–15)
BUN: 16 mg/dL (ref 8–23)
CO2: 28 mmol/L (ref 22–32)
Calcium: 9.4 mg/dL (ref 8.9–10.3)
Chloride: 104 mmol/L (ref 98–111)
Creatinine: 0.61 mg/dL (ref 0.44–1.00)
GFR, Estimated: >60 mL/min (ref >60)
Glucose, Bld: 106 mg/dL — ABNORMAL HIGH (ref 70–99)
Potassium: 4.2 mmol/L (ref 3.5–5.1)
Sodium: 139 mmol/L (ref 135–145)
Total Bilirubin: 0.9 mg/dL (ref 0.3–1.2)
Total Protein: 6.7 g/dL (ref 6.5–8.1)

## 2021-02-24 LAB — CBC WITH DIFFERENTIAL (CANCER CENTER ONLY)
Abs Immature Granulocytes: 0.01 10*3/uL (ref 0.00–0.07)
Basophils Absolute: 0 10*3/uL (ref 0.0–0.1)
Basophils Relative: 1 %
Eosinophils Absolute: 0.2 10*3/uL (ref 0.0–0.5)
Eosinophils Relative: 3 %
HCT: 40.3 % (ref 36.0–46.0)
Hemoglobin: 13.5 g/dL (ref 12.0–15.0)
Immature Granulocytes: 0 %
Lymphocytes Relative: 33 %
Lymphs Abs: 1.8 10*3/uL (ref 0.7–4.0)
MCH: 33.1 pg (ref 26.0–34.0)
MCHC: 33.5 g/dL (ref 30.0–36.0)
MCV: 98.8 fL (ref 80.0–100.0)
Monocytes Absolute: 0.4 10*3/uL (ref 0.1–1.0)
Monocytes Relative: 7 %
Neutro Abs: 3.1 10*3/uL (ref 1.7–7.7)
Neutrophils Relative %: 56 %
Platelet Count: 240 10*3/uL (ref 150–400)
RBC: 4.08 MIL/uL (ref 3.87–5.11)
RDW: 14.1 % (ref 11.5–15.5)
WBC Count: 5.6 10*3/uL (ref 4.0–10.5)
nRBC: 0 % (ref 0.0–0.2)

## 2021-02-24 NOTE — Progress Notes (Signed)
[name]   [phone] [phone]   Clinic Follow up Note   Patient Care Team: Lajean Manes, MD as PCP - General (Internal Medicine) Mauro Kaufmann, RN as Oncology Nurse Navigator Rockwell Germany, RN as Oncology Nurse Navigator Coralie Keens, MD as Consulting Physician (General Surgery) Truitt Merle, MD as Consulting Physician (Hematology) Kyung Rudd, MD as Consulting Physician (Radiation Oncology) Alla Feeling, NP as Nurse Practitioner (Nurse Practitioner)  Date of Service:  02/24/2021  CHIEF COMPLAINT: f/u of right breast DCIS  CURRENT THERAPY:  Raloxifene, started 11/25/20.  ASSESSMENT & PLAN:  Maureen Figueroa is a 71 y.o. female with   1. Right breast DCIS, intermediate grade, ER+/PR+ -She opted to proceed with right mastectomy on 08/03/20 by Dr. Ninfa Linden. Pathology again showed intermediate grade DCIS, 0.9 cm, margins uninvolved. She does not need postmastectomy radiation therapy. -she started raloxifene on 11/25/20. She is tolerating well. We will plan for 5 years.   2. Genetics -She has family history of breast cancer (sister and PGM) and bladder cancer (Father).  -Testing 06/03/20 was negative.   3. Alcohol use -She had increased alcohol use since the pandemic in 2020, currently 2-3 glasses a day. We previously discussed reducing, and she agreed.   4. Osteopenia -She has history of osteoporosis and s/p 5 years of Fosamax, she now has osteopenia. -raloxifene can help strengthen her bone.   5. Social support -She has been separated from husband for the past 7 years. She remains married for her 1 daughter. -She currently lives with her boyfriend but is looking into ways to no longer live with him. I will refer her to SW for assistance and resources.   6. Endometriosis, Hormonal replacement, HSV -Due to endometriosis since the birth of her daughter, she has been on birth control until menopause. She was then started on hormonal  replacement. She continued until breast cancer diagnosis. -She would like to remain sexually active. I recommend she not restart hormonal replacement moving forward given her ER/PR positive disease. She is agreeable. -She has HSV, on Acyclovir.      PLAN:  -continue raloxifene -left mammogram due 04/2021 -Lab and follow-up with NP Lacie in 6 months   No problem-specific Assessment & Plan notes found for this encounter.   SUMMARY OF ONCOLOGIC HISTORY: Oncology History Overview Note  Cancer Staging Ductal carcinoma in situ (DCIS) of right breast Staging form: Breast, AJCC 8th Edition - Clinical stage from 06/03/2020: Stage 0 (cTis (DCIS), cN0, cM0, ER+, PR+, HER2: Not Assessed) - Unsigned Stage prefix: Initial diagnosis Nuclear grade: G2 Laterality: Right Staged by: Pathologist and managing physician Stage used in treatment planning: Yes National guidelines used in treatment planning: Yes Type of national guideline used in treatment planning: NCCN - Pathologic stage from 08/03/2020: Stage Unknown (pTis (DCIS), pNX, cM0, G2, ER+, PR-, HER2: Not Assessed) - Signed by Alla Feeling, NP on 11/25/2020 Histologic grading system: 3 grade system    Ductal carcinoma in situ (DCIS) of right breast  05/13/2020 Mammogram   Impression: The 1.8cm grouped amophous calcifications in teh right breasr upper outer aspect middle depth 7cmfn are suspicious. A biopsy is recommended.    05/26/2020 Initial Biopsy   Diagnosis Breast, right, nmededle core biopsy, 12:00, 7cmfn -DUCTAL CARCINOMA IN SITU WITH CALCIFICATIONS.  The DCIS has intermediate nuclear grade.     05/29/2020 Initial Diagnosis   Ductal carcinoma in situ (DCIS) of right breast   06/14/2020 Genetic Testing   Negative genetic testing:  No pathogenic variants detected on the [employer] BRCAplus panel and CancerNext-Expanded + RNAinsight panel. The report dates are 06/14/2020 and 07/02/2020, respectively. A variant of uncertain significance (VUS)  was detected in the DICER1 gene called p.S299L (c.896C>T).  The BRCAplus panel offered by [employer] and includes sequencing and deletion/duplication analysis for the following 8 genes: ATM, BRCA1, BRCA2, CDH1, CHEK2, PALB2, PTEN, and TP53. The CancerNext-Expanded + RNAinsight gene panel offered by [employer] and includes sequencing and rearrangement analysis for the following 77 genes: AIP, ALK, APC, ATM, AXIN2, BAP1, BARD1, BLM, BMPR1A, BRCA1, BRCA2, BRIP1, CDC73, CDH1, CDK4, CDKN1B, CDKN2A, CHEK2, CTNNA1, DICER1, FANCC, FH, FLCN, GALNT12, KIF1B, LZTR1, MAX, MEN1, MET, MLH1, MSH2, MSH3, MSH6, MUTYH, NBN, NF1, NF2, NTHL1, PALB2, PHOX2B, PMS2, POT1, PRKAR1A, PTCH1, PTEN, RAD51C, RAD51D, RB1, RECQL, RET, SDHA, SDHAF2, SDHB, SDHC, SDHD, SMAD4, SMARCA4, SMARCB1, SMARCE1, STK11, SUFU, TMEM127, TP53, TSC1, TSC2, VHL and XRCC2 (sequencing and deletion/duplication); EGFR, EGLN1, HOXB13, KIT, MITF, PDGFRA, POLD1 and POLE (sequencing only); EPCAM and GREM1 (deletion/duplication only). RNA data is routinely analyzed for use in variant interpretation for all genes.   06/15/2020 Imaging   Bilateral Breast MRI  IMPRESSION: 1. There is ill-defined non mass enhancement in the central right breast at 12 o'clock spanning up to 4.5 cm. Review of the outside mammogram demonstrates a group of calcifications anterior to the recently biopsied DCIS and another group slightly inferior and lateral to the recently diagnosed DCIS. Taking these 2 additional groups of calcifications into account, the total span of calcifications is 5.1 cm likely correlating with the non mass enhancement seen today. The non mass enhancement today and the additional groups of calcifications could also represent DCIS. 2. No other abnormalities in either breast.   07/01/2020 Pathology Results   Diagnosis 1. Breast, right, needle core biopsy - INTERMEDIATE TO HIGH-GRADE DUCTAL CARCINOMA IN SITU WITH FOCAL NECROSIS AND CALCIFICATIONS.  SEE NOTE 2. Breast, right, needle core biopsy - ATYPICAL LOBULAR HYPERPLASIA. SEE NOTE - FIBROCYSTIC CHANGE WITH CALCIFICATIONS  1. PROGNOSTIC INDICATORS Results: IMMUNOHISTOCHEMICAL AND MORPHOMETRIC ANALYSIS PERFORMED MANUALLY Estrogen Receptor: 95%, POSITIVE, STRONG STAINING INTENSITY Progesterone Receptor: 0%, NEGATIVE   08/03/2020 Pathology Results   FINAL MICROSCOPIC DIAGNOSIS:   A. BREAST, RIGHT, MASTECTOMY:  -  Ductal carcinoma in situ, intermediate grade, 0.9 cm  -  Margins uninvolved by carcinoma (2.0; posterior margin)  -  Lobular neoplasia (atypical lobular hyperplasia)  -  Fibrocystic changes  -  Calcifications associated with carcinoma and benign breast tissue  -  Previous biopsy site changes    08/03/2020 Cancer Staging   Staging form: Breast, AJCC 8th Edition - Pathologic stage from 08/03/2020: Stage Unknown (pTis (DCIS), pNX, cM0, G2, ER+, PR-, HER2: Not Assessed) - Signed by Alla Feeling, NP on 11/25/2020 Histologic grading system: 3 grade system    11/25/2020 Survivorship   SCP delivered by Cira Rue, NP   11/25/2020 -  Anti-estrogen oral therapy   Begin Raloxifene 60 mg po once daily, goal 5 years      INTERVAL HISTORY:  Maureen Figueroa is here for a follow up of breast. She was last seen by me on 08/24/20 and in survivorship in the interim. She presents to the clinic alone. She reports she is doing well overall. She notes she is tolerating the raloxifene well.   All other systems were reviewed with the patient and are negative.  MEDICAL HISTORY:  Past Medical History:  Diagnosis Date   Back injury 1994   back/neck injury    Cancer (  [place])    Breast cancer--right   Endometriosis    Family history of breast cancer    Family history of kidney cancer    Family history of lung cancer    Lyme disease, unspecified    followed by PCP   Osteoporosis    Pinched nerve in neck 12/2013   Postmenopausal bleeding 2016   uterine fibroid, on HRT   STD  (sexually transmitted disease)    HSV   Urinary incontinence     SURGICAL HISTORY: Past Surgical History:  Procedure Laterality Date   BREAST SURGERY  07-23-08   benign Rt.breast biopsy--benign stromal fibrosis with calcifications:[facility]   CATARACT EXTRACTION, BILATERAL Bilateral 11/2016, 03/2017   EYE SURGERY Right    cyst removed x2    LASIK     OOPHORECTOMY  12/86   PELVIC LAPAROSCOPY  1986   endometriosis   TOTAL MASTECTOMY Right 08/03/2020   Procedure: RIGHT TOTAL MASTECTOMY;  Surgeon: Coralie Keens, MD;  Location: [facility];  Service: General;  Laterality: Right;    I have reviewed the social history and family history with the patient and they are unchanged from previous note.  ALLERGIES:  is allergic to metaxalone, chocolate, and penicillins.  MEDICATIONS:  Current Outpatient Medications  Medication Sig Dispense Refill   Ascorbic Acid (VITAMIN C PO) Take 500 mg by mouth daily.      Calcium Carb-Cholecalciferol (CALCIUM + D3) 600-200 MG-UNIT TABS 1 tablet     carisoprodol (SOMA) 350 MG tablet Take 350 mg by mouth daily as needed for muscle spasms.      Cholecalciferol (D3) 50 MCG (2000 UT) TABS      Multiple Vitamin (MULTI-VITAMIN PO) Take by mouth.     raloxifene (EVISTA) 60 MG tablet Take 1 tablet (60 mg total) by mouth daily. 90 tablet 3   selenium sulfide (SELSUN) 2.5 % shampoo      tretinoin microspheres (RETIN-A MICRO) 0.1 % gel Apply 1 application topically at bedtime.      valACYclovir (VALTREX) 1000 MG tablet TAKE 1 TABLET(1000 MG) BY MOUTH AT BEDTIME.  Take one tablet (1000 mg) by mouth twice a day for 3 days for genital outbreak and take two tablets (2000 mg) by mouth every 12 hours for 24 hours for an oral outbreak. 110 tablet 3   VITAMIN E EX Apply topically. suppository     No current facility-administered medications for this visit.    PHYSICAL EXAMINATION: ECOG PERFORMANCE STATUS: 0 - Asymptomatic  Vitals:   02/24/21 1516  BP: (!)  149/80  Pulse: 79  Resp: 18  Temp: 98.4 F (36.9 C)  SpO2: 100%   Wt Readings from Last 3 Encounters:  02/24/21 157 lb 14.4 oz (71.6 kg)  12/29/20 155 lb (70.3 kg)  11/25/20 155 lb 3.2 oz (70.4 kg)     GENERAL:alert, no distress and comfortable SKIN: skin color, texture, turgor are normal, no rashes or significant lesions EYES: normal, Conjunctiva are pink and non-injected, sclera clear  NECK: supple, thyroid normal size, non-tender, without nodularity LYMPH:  no palpable lymphadenopathy in the cervical, axillary  LUNGS: clear to auscultation and percussion with normal breathing effort HEART: regular rate & rhythm and no murmurs and no lower extremity edema ABDOMEN:abdomen soft, non-tender and normal bowel sounds Musculoskeletal:no cyanosis of digits and no clubbing  NEURO: alert & oriented x 3 with fluent speech, no focal motor/sensory deficits BREAST: Status post right mastectomy.  No palpable mass, nodules or adenopathy bilaterally. Breast exam benign.  LABORATORY DATA:  I have reviewed the data as listed CBC Latest Ref Rng & Units 02/24/2021 06/03/2020 12/31/2013  WBC 4.0 - 10.5 K/uL 5.6 5.6 -  Hemoglobin 12.0 - 15.0 g/dL 13.5 14.9 14.6  Hematocrit 36.0 - 46.0 % 40.3 44.7 43.0  Platelets 150 - 400 K/uL 240 236 -     CMP Latest Ref Rng & Units 02/24/2021 06/03/2020 01/01/2014  Glucose 70 - 99 mg/dL 106(H) 94 90  BUN 8 - 23 mg/dL _0 Creatinine 0.44 - 1.00 mg/dL 0.61 0.74 0.62  Sodium 135 - 145 mmol/L 139 140 145  Potassium 3.5 - 5.1 mmol/L 4.2 4.0 4.3  Chloride 98 - 111 mmol/L 104 103 108  CO2 22 - 32 mmol/L _1 Calcium 8.9 - 10.3 mg/dL 9.4 9.5 8.7  Total Protein 6.5 - 8.1 g/dL 6.7 7.1 6.0  Total Bilirubin 0.3 - 1.2 mg/dL 0.9 1.1 1.2  Alkaline Phos 38 - 126 U/L 60 70 66  AST 15 - 41 U/L 11(L) 11(L) 12  ALT 0 - 44 U/L _2 RADIOGRAPHIC STUDIES: I have personally reviewed the radiological images as listed and agreed with the findings in the  report. No results found.    Orders Placed This Encounter  Procedures   MM Digital Screening Unilat L    Standing Status:   Future    Standing Expiration Date:   02/24/2022    Scheduling Instructions:     [facility]    Order Specific Question:   Reason for Exam (SYMPTOM  OR DIAGNOSIS REQUIRED)    Answer:   screening    Order Specific Question:   Preferred imaging location?    Answer:   External   All questions were answered. The patient knows to call the clinic with any problems, questions or concerns. No barriers to learning was detected. The total time spent in the appointment was 30 minutes.     Truitt Merle, MD 02/24/2021   I, Wilburn Mylar, am acting as scribe for Truitt Merle, MD.   I have reviewed the above documentation for accuracy and completeness, and I agree with the above.

## 2021-02-25 ENCOUNTER — Telehealth: Payer: Self-pay | Admitting: Hematology

## 2021-02-25 NOTE — Telephone Encounter (Signed)
Left message with follow-up appointment per 2/1 los.

## 2021-06-01 ENCOUNTER — Ambulatory Visit (INDEPENDENT_AMBULATORY_CARE_PROVIDER_SITE_OTHER): Payer: BC Managed Care – PPO | Admitting: Sports Medicine

## 2021-06-01 ENCOUNTER — Encounter: Payer: BC Managed Care – PPO | Admitting: Sports Medicine

## 2021-06-01 VITALS — BP 130/80 | Ht 65.0 in | Wt 160.0 lb

## 2021-06-01 DIAGNOSIS — M7742 Metatarsalgia, left foot: Secondary | ICD-10-CM

## 2021-06-01 DIAGNOSIS — M7741 Metatarsalgia, right foot: Secondary | ICD-10-CM

## 2021-06-02 NOTE — Progress Notes (Signed)
Patient ID: Maureen Figueroa, female   DOB: Feb 06, 1950, 71 y.o.   MRN: [id number] ? ?Trystyn comes in today for orthotic adjustment.  We made her a new pair of custom orthotics on December 29, 2020.  We had added a 3/16 inch heel lift as well as a large metatarsal pad to this right orthotic.  She has been hesitant to use these.  Her previous pair of orthotics which were constructed here also had a metatarsal pad (medium) and we added a 3/16 inch heel lift to the right as well.  However, she feels like her foot is pitched forward in her shoe and she is getting quite a bit of pressure on her toes.  The left orthotic is comfortable.  She also has an old pair of custom orthotics which were constructed at another office.  These orthotics do not have any sort of heel lift but do have good arch support and built in metatarsal pads. ? ?I recommended that we change the metatarsal pad in her right orthotics to a small and that we remove the heel lift.  This did seem to be more comfortable to her.  I gave her some additional small metatarsal pads to use in other shoes.  Her oldest pair of orthotics also has a metatarsal bar across them.  I explained to Shakelia that we do not have this type of material here but that she may want to consider returning to her "shoe guy" to have a similar type of pad put on her custom orthotics that we created.  She is encouraged to follow-up again with any further issues that she may have with her orthotics. ? ?Total time spent with the patient was approximately 20 minutes. ? ?This note was dictated using Dragon naturally speaking software and may contain errors in syntax, spelling, or content which have not been identified prior to signing this note.  ?

## 2021-06-03 ENCOUNTER — Ambulatory Visit (INDEPENDENT_AMBULATORY_CARE_PROVIDER_SITE_OTHER): Payer: BC Managed Care – PPO | Admitting: Obstetrics and Gynecology

## 2021-06-03 ENCOUNTER — Encounter: Payer: Self-pay | Admitting: Obstetrics and Gynecology

## 2021-06-03 VITALS — BP 128/74 | HR 56 | Ht 65.25 in | Wt 158.0 lb

## 2021-06-03 DIAGNOSIS — Z9189 Other specified personal risk factors, not elsewhere classified: Secondary | ICD-10-CM

## 2021-06-03 DIAGNOSIS — Z853 Personal history of malignant neoplasm of breast: Secondary | ICD-10-CM

## 2021-06-03 DIAGNOSIS — R3915 Urgency of urination: Secondary | ICD-10-CM | POA: Diagnosis not present

## 2021-06-03 DIAGNOSIS — Z01419 Encounter for gynecological examination (general) (routine) without abnormal findings: Secondary | ICD-10-CM

## 2021-06-03 DIAGNOSIS — N952 Postmenopausal atrophic vaginitis: Secondary | ICD-10-CM

## 2021-06-03 MED ORDER — NONFORMULARY OR COMPOUNDED ITEM: 11 refills | Status: DC

## 2021-06-03 MED ORDER — VALACYCLOVIR HCL 1 G PO TABS
ORAL_TABLET | ORAL | 3 refills | Status: DC
Start: 2021-06-03 — End: 2022-04-08

## 2021-06-03 NOTE — Progress Notes (Addendum)
71 y.o. G1P1 Married Caucasian female here for annual exam.  ? ?Increased urinary urgency, since prior to Covid pandemic beginning.  ?No incontinence.  ? ?Had right mastectomy.  ?Having hot flashes with Evista use.  ? ?PCP: Lajean Manes, MD ? ?Patient's last menstrual period was 09/24/2005.     ?  ?    ?Sexually active: Yes.    ?The current method of family planning is post menopausal status.    ?Exercising: Yes.     Planks, stretches, very active ?Smoker:  no ? ?Health Maintenance: ?Pap:   11/15/17 Neg:Neg HR HPV,  10-03-14 Neg:Neg HR HPV,  06-04-12 Neg:Neg HR HPV ?History of abnormal Pap:  no ?MMG:  05-12-21 Neg/BiRads1 ?Colonoscopy:   06/2015 normal;10 years ?BMD:  12-10-20  Result :Osteopenia.  PCP.  ?TDaP:  2017 ?Gardasil:   n/a ?HIV: Neg years ago ?Hep C: PCP ?Screening Labs:  PCP. ? ? reports that she has never smoked. She has never used smokeless tobacco. She reports current alcohol use of about 14.0 standard drinks per week. She reports that she does not use drugs. ? ?Past Medical History:  ?Diagnosis Date  ? Back injury 1994  ? back/neck injury   ? Cancer [facility])   ? Breast cancer--right  ? Endometriosis   ? Family history of breast cancer   ? Family history of kidney cancer   ? Family history of lung cancer   ? Lyme disease, unspecified   ? followed by PCP  ? Osteoporosis   ? Pinched nerve in neck 12/2013  ? Postmenopausal bleeding 2016  ? uterine fibroid, on HRT  ? STD (sexually transmitted disease)   ? HSV  ? Urinary incontinence   ? ? ?Past Surgical History:  ?Procedure Laterality Date  ? BREAST SURGERY  07-23-08  ? benign Rt.breast biopsy--benign stromal fibrosis with calcifications:[facility]  ? CATARACT EXTRACTION, BILATERAL Bilateral 11/2016, 03/2017  ? EYE SURGERY Right   ? cyst removed x2   ? LASIK    ? OOPHORECTOMY  12/86  ? PELVIC LAPAROSCOPY  1986  ? endometriosis  ? TOTAL MASTECTOMY Right 08/03/2020  ? Procedure: RIGHT TOTAL MASTECTOMY;  Surgeon: Coralie Keens, MD;  Location: [facility];   Service: General;  Laterality: Right;  ? ? ?Current Outpatient Medications  ?Medication Sig Dispense Refill  ? Ascorbic Acid (VITAMIN C PO) Take 500 mg by mouth daily.     ? Calcium Carb-Cholecalciferol (CALCIUM + D3) 600-200 MG-UNIT TABS 1 tablet    ? carisoprodol (SOMA) 350 MG tablet 1 tablet as needed    ? Cholecalciferol (D3) 50 MCG (2000 UT) TABS     ? Multiple Vitamin (MULTI-VITAMIN PO) Take by mouth.    ? raloxifene (EVISTA) 60 MG tablet 1 tablet    ? selenium sulfide (SELSUN) 2.5 % shampoo     ? tretinoin microspheres (RETIN-A MICRO) 0.1 % gel Apply 1 application topically at bedtime.     ? valACYclovir (VALTREX) 1000 MG tablet TAKE 1 TABLET(1000 MG) BY MOUTH AT BEDTIME.  Take one tablet (1000 mg) by mouth twice a day for 3 days for genital outbreak and take two tablets (2000 mg) by mouth every 12 hours for 24 hours for an oral outbreak. 110 tablet 3  ? VITAMIN E EX Apply topically. suppository    ? ?No current facility-administered medications for this visit.  ? ? ?Family History  ?Problem Relation Age of Onset  ? Hypertension Mother   ? COPD Mother   ? Hyperlipidemia Mother   ?  Kidney cancer Father 40  ?     dx late 78s  ? Breast cancer Sister 15  ? Pancreatic cancer Maternal Aunt   ?     dx 74s (DDT exposure)  ? Cancer Maternal Aunt   ?     gastrointestinal dx 83s (DDT exposure)  ? Prostate cancer Maternal Uncle   ?     (DDT exposure)  ? Osteoporosis Maternal Grandfather   ? Osteoporosis Paternal Grandmother   ? Breast cancer Paternal Grandmother 46  ?     mastectomy  ? Lung cancer Paternal Grandfather   ?     dx late 63s, smoker  ? ? ?Review of Systems  ?Genitourinary:  Positive for urgency.  ?All other systems reviewed and are negative. ? ?Exam:   ?BP 128/74   Pulse (!) 56   Ht 5' 5.25" (1.657 m)   Wt 158 lb (71.7 kg)   LMP 09/24/2005   SpO2 98%   BMI 26.09 kg/m?     ?General appearance: alert, cooperative and appears stated age ?Head: normocephalic, without obvious abnormality, atraumatic ?Neck:  no adenopathy, supple, symmetrical, trachea midline and thyroid normal to inspection and palpation ?Lungs: clear to auscultation bilaterally ?Breasts: right breast absent, no axillary adenopathy.  ?left breast with normal appearance, no masses or tenderness, No nipple retraction or dimpling, No nipple discharge or bleeding, No axillary adenopathy ?Heart: regular rate and rhythm ?Abdomen: soft, non-tender; no masses, no organomegaly ?Extremities: extremities normal, atraumatic, no cyanosis or edema ?Skin: skin color, texture, turgor normal. No rashes or lesions ?Lymph nodes: cervical, supraclavicular, and axillary nodes normal. ?Neurologic: grossly normal ? ?Pelvic: External genitalia:  no lesions ?             No abnormal inguinal nodes palpated. ?             Urethra:  normal appearing urethra with no masses, tenderness or lesions ?             Bartholins and Skenes: normal    ?             Vagina: normal appearing vagina with normal color and discharge, no lesions ?             Cervix: no lesions ?             Pap taken: no ?Bimanual Exam:  Uterus:  normal size, contour, position, consistency, mobility, non-tender ?             Adnexa: no mass, fullness, tenderness ?             Rectal exam: yes.  Confirms. ?             Anus:  normal sphincter tone, no lesions ? ?Chaperone was present for exam:   Estill Bamberg, CMA ? ?Assessment:   ?Well woman visit with gynecologic exam. ?Vaginal atrophy. ?Status post oophorectomy.  ?Hx HSV II.   ?Osteopenia. PCP following. On Evista.  ?Personal and FH breast cancer. MGM and sister.  ?Patient is status post right mastectomy.  No radiation and no chemotherapy. ?Urinary urgency.  ? ?Plan: ?Mammogram screening discussed. ?Self breast awareness reviewed. ?Pap and HR HPV  2024. ?Guidelines for Calcium, Vitamin D, regular exercise program including cardiovascular and weight bearing exercise. ?We discussed bladder irritants and potential pelvic floor therapy.  She will consider PT. ?Vit E  suppositories 200 U in vaginal twice weekly at hs. Rx will be faxed to [facility]. ?Follow up annually and prn.  ? ?  After visit summary provided.  ? ? ? ? ? ? ?

## 2021-06-03 NOTE — Patient Instructions (Addendum)
Try to find Oxytrol, which is a transdermal patch urinary incontinence. ?I am not certain if it is still available on the market without a prescription.  ? ? ?EXERCISE AND DIET:  We recommended that you start or continue a regular exercise program for good health. Regular exercise means any activity that makes your heart beat faster and makes you sweat.  We recommend exercising at least 30 minutes per day at least 3 days a week, preferably 4 or 5.  We also recommend a diet low in fat and sugar.  Inactivity, poor dietary choices and obesity can cause diabetes, heart attack, stroke, and kidney damage, among others.   ? ?ALCOHOL AND SMOKING:  Women should limit their alcohol intake to no more than 7 drinks/beers/glasses of wine (combined, not each!) per week. Moderation of alcohol intake to this level decreases your risk of breast cancer and liver damage. And of course, no recreational drugs are part of a healthy lifestyle.  And absolutely no smoking or even second hand smoke. Most people know smoking can cause heart and lung diseases, but did you know it also contributes to weakening of your bones? Aging of your skin?  Yellowing of your teeth and nails? ? ?CALCIUM AND VITAMIN D:  Adequate intake of calcium and Vitamin D are recommended.  The recommendations for exact amounts of these supplements seem to change often, but generally speaking 600 mg of calcium (either carbonate or citrate) and 800 units of Vitamin D per day seems prudent. Certain women may benefit from higher intake of Vitamin D.  If you are among these women, your doctor will have told you during your visit.   ? ?PAP SMEARS:  Pap smears, to check for cervical cancer or precancers,  have traditionally been done yearly, although recent scientific advances have shown that most women can have pap smears less often.  However, every woman still should have a physical exam from her gynecologist every year. It will include a breast check, inspection of the  vulva and vagina to check for abnormal growths or skin changes, a visual exam of the cervix, and then an exam to evaluate the size and shape of the uterus and ovaries.  And after 71 years of age, a rectal exam is indicated to check for rectal cancers. We will also provide age appropriate advice regarding health maintenance, like when you should have certain vaccines, screening for sexually transmitted diseases, bone density testing, colonoscopy, mammograms, etc.  ? ?MAMMOGRAMS:  All women over 58 years old should have a yearly mammogram. Many facilities now offer a "3D" mammogram, which may cost around $50 extra out of pocket. If possible,  we recommend you accept the option to have the 3D mammogram performed.  It both reduces the number of women who will be called back for extra views which then turn out to be normal, and it is better than the routine mammogram at detecting truly abnormal areas.   ? ?COLONOSCOPY:  Colonoscopy to screen for colon cancer is recommended for all women at age 60.  We know, you hate the idea of the prep.  We agree, BUT, having colon cancer and not knowing it is worse!!  Colon cancer so often starts as a polyp that can be seen and removed at colonscopy, which can quite literally save your life!  And if your first colonoscopy is normal and you have no family history of colon cancer, most women don't have to have it again for 10 years.  Once every  ten years, you can do something that may end up saving your life, right?  We will be happy to help you get it scheduled when you are ready.  Be sure to check your insurance coverage so you understand how much it will cost.  It may be covered as a preventative service at no cost, but you should check your particular policy.   ? ? ?

## 2021-07-07 ENCOUNTER — Telehealth: Payer: Self-pay | Admitting: *Deleted

## 2021-07-07 MED ORDER — NONFORMULARY OR COMPOUNDED ITEM: 11 refills | Status: DC

## 2021-07-07 NOTE — Telephone Encounter (Signed)
Thank you for the update and for helping our patient.   You may close this encounter.

## 2021-07-07 NOTE — Telephone Encounter (Signed)
Patient called stating she never heard from the pharmacy about her vitamin E suppositories from office visit on 06/03/21. It appears id was documented the Rx was "faxed to [facility]". I called [facility] and they had no record of Rx  nor do they have a profile for patient. [facility] does not make vitamin E suppositories. I called patient to informed her that it will need to be filled at [facility] and once ready they will call patient to come pick up.

## 2021-08-24 ENCOUNTER — Inpatient Hospital Stay: Payer: Medicare Other

## 2021-08-24 ENCOUNTER — Inpatient Hospital Stay: Payer: Medicare Other | Attending: Nurse Practitioner | Admitting: Nurse Practitioner

## 2021-08-24 ENCOUNTER — Other Ambulatory Visit: Payer: Self-pay

## 2021-08-24 ENCOUNTER — Encounter: Payer: Self-pay | Admitting: Nurse Practitioner

## 2021-08-24 VITALS — BP 134/92 | HR 68 | Temp 98.6°F | Resp 16 | Wt 158.5 lb

## 2021-08-24 DIAGNOSIS — Z8051 Family history of malignant neoplasm of kidney: Secondary | ICD-10-CM | POA: Diagnosis not present

## 2021-08-24 DIAGNOSIS — Z9882 Breast implant status: Secondary | ICD-10-CM | POA: Diagnosis not present

## 2021-08-24 DIAGNOSIS — Z79899 Other long term (current) drug therapy: Secondary | ICD-10-CM | POA: Insufficient documentation

## 2021-08-24 DIAGNOSIS — Z880 Allergy status to penicillin: Secondary | ICD-10-CM | POA: Diagnosis not present

## 2021-08-24 DIAGNOSIS — D0511 Intraductal carcinoma in situ of right breast: Secondary | ICD-10-CM

## 2021-08-24 DIAGNOSIS — Z801 Family history of malignant neoplasm of trachea, bronchus and lung: Secondary | ICD-10-CM | POA: Insufficient documentation

## 2021-08-24 DIAGNOSIS — N644 Mastodynia: Secondary | ICD-10-CM

## 2021-08-24 DIAGNOSIS — M542 Cervicalgia: Secondary | ICD-10-CM | POA: Diagnosis not present

## 2021-08-24 DIAGNOSIS — Z7289 Other problems related to lifestyle: Secondary | ICD-10-CM | POA: Diagnosis not present

## 2021-08-24 DIAGNOSIS — Z803 Family history of malignant neoplasm of breast: Secondary | ICD-10-CM | POA: Insufficient documentation

## 2021-08-24 DIAGNOSIS — R232 Flushing: Secondary | ICD-10-CM | POA: Diagnosis not present

## 2021-08-24 DIAGNOSIS — Z90721 Acquired absence of ovaries, unilateral: Secondary | ICD-10-CM | POA: Diagnosis not present

## 2021-08-24 DIAGNOSIS — N809 Endometriosis, unspecified: Secondary | ICD-10-CM | POA: Diagnosis not present

## 2021-08-24 DIAGNOSIS — R17 Unspecified jaundice: Secondary | ICD-10-CM

## 2021-08-24 DIAGNOSIS — M8580 Other specified disorders of bone density and structure, unspecified site: Secondary | ICD-10-CM | POA: Insufficient documentation

## 2021-08-24 LAB — CBC WITH DIFFERENTIAL/PLATELET
Abs Immature Granulocytes: 0.01 10*3/uL (ref 0.00–0.07)
Basophils Absolute: 0 10*3/uL (ref 0.0–0.1)
Basophils Relative: 1 %
Eosinophils Absolute: 0.2 10*3/uL (ref 0.0–0.5)
Eosinophils Relative: 4 %
HCT: 43.4 % (ref 36.0–46.0)
Hemoglobin: 14.9 g/dL (ref 12.0–15.0)
Immature Granulocytes: 0 %
Lymphocytes Relative: 29 %
Lymphs Abs: 1.5 10*3/uL (ref 0.7–4.0)
MCH: 34 pg (ref 26.0–34.0)
MCHC: 34.3 g/dL (ref 30.0–36.0)
MCV: 99.1 fL (ref 80.0–100.0)
Monocytes Absolute: 0.4 10*3/uL (ref 0.1–1.0)
Monocytes Relative: 8 %
Neutro Abs: 3 10*3/uL (ref 1.7–7.7)
Neutrophils Relative %: 58 %
Platelets: 238 10*3/uL (ref 150–400)
RBC: 4.38 MIL/uL (ref 3.87–5.11)
RDW: 12.5 % (ref 11.5–15.5)
WBC: 5.1 10*3/uL (ref 4.0–10.5)
nRBC: 0 % (ref 0.0–0.2)

## 2021-08-24 LAB — COMPREHENSIVE METABOLIC PANEL
ALT: 16 U/L (ref 0–44)
AST: 12 U/L — ABNORMAL LOW (ref 15–41)
Albumin: 4.6 g/dL (ref 3.5–5.0)
Alkaline Phosphatase: 52 U/L (ref 38–126)
Anion gap: 7 (ref 5–15)
BUN: 17 mg/dL (ref 8–23)
CO2: 27 mmol/L (ref 22–32)
Calcium: 9.1 mg/dL (ref 8.9–10.3)
Chloride: 105 mmol/L (ref 98–111)
Creatinine, Ser: 0.52 mg/dL (ref 0.44–1.00)
GFR, Estimated: >60 mL/min (ref >60)
Glucose, Bld: 106 mg/dL — ABNORMAL HIGH (ref 70–99)
Potassium: 4 mmol/L (ref 3.5–5.1)
Sodium: 139 mmol/L (ref 135–145)
Total Bilirubin: 1.4 mg/dL — ABNORMAL HIGH (ref 0.3–1.2)
Total Protein: 7 g/dL (ref 6.5–8.1)

## 2021-08-24 NOTE — Progress Notes (Signed)
Maureen Figueroa   [phone] [phone]   Clinic Follow up Note   Patient Care Team: Lajean Manes, MD as PCP - General (Internal Medicine) Mauro Kaufmann, RN as Oncology Nurse Navigator Rockwell Germany, RN as Oncology Nurse Navigator Coralie Keens, MD as Consulting Physician (General Surgery) Truitt Merle, MD as Consulting Physician (Hematology) Kyung Rudd, MD as Consulting Physician (Radiation Oncology) Alla Feeling, NP as Nurse Practitioner (Nurse Practitioner) 08/24/2021  CHIEF COMPLAINT: Follow up right breast DCIS  SUMMARY OF ONCOLOGIC HISTORY: Oncology History Overview Note  Cancer Staging Ductal carcinoma in situ (DCIS) of right breast Staging form: Breast, AJCC 8th Edition - Clinical stage from 06/03/2020: Stage 0 (cTis (DCIS), cN0, cM0, ER+, PR+, HER2: Not Assessed) - Unsigned Stage prefix: Initial diagnosis Nuclear grade: G2 Laterality: Right Staged by: Pathologist and managing physician Stage used in treatment planning: Yes National guidelines used in treatment planning: Yes Type of national guideline used in treatment planning: NCCN - Pathologic stage from 08/03/2020: Stage Unknown (pTis (DCIS), pNX, cM0, G2, ER+, PR-, HER2: Not Assessed) - Signed by Alla Feeling, NP on 11/25/2020 Histologic grading system: 3 grade system    Ductal carcinoma in situ (DCIS) of right breast  05/13/2020 Mammogram   Impression: The 1.8cm grouped amophous calcifications in teh right breasr upper outer aspect middle depth 7cmfn are suspicious. A biopsy is recommended.    05/26/2020 Initial Biopsy   Diagnosis Breast, right, nmededle core biopsy, 12:00, 7cmfn -DUCTAL CARCINOMA IN SITU WITH CALCIFICATIONS.  The DCIS has intermediate nuclear grade.     05/29/2020 Initial Diagnosis   Ductal carcinoma in situ (DCIS) of right breast   06/14/2020 Genetic Testing   Negative genetic testing:  No pathogenic variants detected on the [facility] BRCAplus panel  and CancerNext-Expanded + RNAinsight panel. The report dates are 06/14/2020 and 07/02/2020, respectively. A variant of uncertain significance (VUS) was detected in the DICER1 gene called p.S299L (c.896C>T).  The BRCAplus panel offered by [employer] and includes sequencing and deletion/duplication analysis for the following 8 genes: ATM, BRCA1, BRCA2, CDH1, CHEK2, PALB2, PTEN, and TP53. The CancerNext-Expanded + RNAinsight gene panel offered by [employer] and includes sequencing and rearrangement analysis for the following 77 genes: AIP, ALK, APC, ATM, AXIN2, BAP1, BARD1, BLM, BMPR1A, BRCA1, BRCA2, BRIP1, CDC73, CDH1, CDK4, CDKN1B, CDKN2A, CHEK2, CTNNA1, DICER1, FANCC, FH, FLCN, GALNT12, KIF1B, LZTR1, MAX, MEN1, MET, MLH1, MSH2, MSH3, MSH6, MUTYH, NBN, NF1, NF2, NTHL1, PALB2, PHOX2B, PMS2, POT1, PRKAR1A, PTCH1, PTEN, RAD51C, RAD51D, RB1, RECQL, RET, SDHA, SDHAF2, SDHB, SDHC, SDHD, SMAD4, SMARCA4, SMARCB1, SMARCE1, STK11, SUFU, TMEM127, TP53, TSC1, TSC2, VHL and XRCC2 (sequencing and deletion/duplication); EGFR, EGLN1, HOXB13, KIT, MITF, PDGFRA, POLD1 and POLE (sequencing only); EPCAM and GREM1 (deletion/duplication only). RNA data is routinely analyzed for use in variant interpretation for all genes.   06/15/2020 Imaging   Bilateral Breast MRI  IMPRESSION: 1. There is ill-defined non mass enhancement in the central right breast at 12 o'clock spanning up to 4.5 cm. Review of the outside mammogram demonstrates a group of calcifications anterior to the recently biopsied DCIS and another group slightly inferior and lateral to the recently diagnosed DCIS. Taking these 2 additional groups of calcifications into account, the total span of calcifications is 5.1 cm likely correlating with the non mass enhancement seen today. The non mass enhancement today and the additional groups of calcifications could also represent DCIS. 2. No other abnormalities in either breast.   07/01/2020 Pathology Results    Diagnosis 1. Breast,  right, needle core biopsy - INTERMEDIATE TO HIGH-GRADE DUCTAL CARCINOMA IN SITU WITH FOCAL NECROSIS AND CALCIFICATIONS. SEE NOTE 2. Breast, right, needle core biopsy - ATYPICAL LOBULAR HYPERPLASIA. SEE NOTE - FIBROCYSTIC CHANGE WITH CALCIFICATIONS  1. PROGNOSTIC INDICATORS Results: IMMUNOHISTOCHEMICAL AND MORPHOMETRIC ANALYSIS PERFORMED MANUALLY Estrogen Receptor: 95%, POSITIVE, STRONG STAINING INTENSITY Progesterone Receptor: 0%, NEGATIVE   08/03/2020 Pathology Results   FINAL MICROSCOPIC DIAGNOSIS:   A. BREAST, RIGHT, MASTECTOMY:  -  Ductal carcinoma in situ, intermediate grade, 0.9 cm  -  Margins uninvolved by carcinoma (2.0; posterior margin)  -  Lobular neoplasia (atypical lobular hyperplasia)  -  Fibrocystic changes  -  Calcifications associated with carcinoma and benign breast tissue  -  Previous biopsy site changes    08/03/2020 Cancer Staging   Staging form: Breast, AJCC 8th Edition - Pathologic stage from 08/03/2020: Stage Unknown (pTis (DCIS), pNX, cM0, G2, ER+, PR-, HER2: Not Assessed) - Signed by Alla Feeling, NP on 11/25/2020 Histologic grading system: 3 grade system   11/25/2020 Survivorship   SCP delivered by Cira Rue, NP   11/25/2020 -  Anti-estrogen oral therapy   Begin Raloxifene 60 mg po once daily, goal 5 years     CURRENT THERAPY: Raloxifene, started 11/25/2020  INTERVAL HISTORY: [name] returns for follow-up as scheduled, last seen by Dr. Burr Medico 02/24/2021.  Left mammogram at [facility] 05/12/2021 was negative.  At times she feels a sore localized area at the right chest wall near the medial edge of the scar. She continues raloxifene, tolerating well with mild intermittent hot flashes.  Vaginal dryness improved with vitamin E suppository prescribed by OB/GYN.  She can tolerate the side effects.  She continues to deal with right breast prosthesis and getting the right fit.  She went without a prosthetic for 1 month, had posture issues, felt  out of alignment, and had right neck soreness/tightness.  She continues working with [employer].  Her diet has high dairy content.  She drinks alcohol to deal with stress.  She notes her daughter was told she has high bilirubin.  She continues working but is beginning a phase retirement, she remains active.  All other systems were reviewed with the patient and are negative.  MEDICAL HISTORY:  Past Medical History:  Diagnosis Date   Back injury 1994   back/neck injury    Cancer ([place])    Breast cancer--right   Endometriosis    Family history of breast cancer    Family history of kidney cancer    Family history of lung cancer    Lyme disease, unspecified    followed by PCP   Osteoporosis    Pinched nerve in neck 12/2013   Postmenopausal bleeding 2016   uterine fibroid, on HRT   STD (sexually transmitted disease)    HSV   Urinary incontinence     SURGICAL HISTORY: Past Surgical History:  Procedure Laterality Date   BREAST SURGERY  07-23-08   benign Rt.breast biopsy--benign stromal fibrosis with calcifications:[facility]   CATARACT EXTRACTION, BILATERAL Bilateral 11/2016, 03/2017   EYE SURGERY Right    cyst removed x2    LASIK     OOPHORECTOMY  12/86   PELVIC LAPAROSCOPY  1986   endometriosis   TOTAL MASTECTOMY Right 08/03/2020   Procedure: RIGHT TOTAL MASTECTOMY;  Surgeon: Coralie Keens, MD;  Location: [facility];  Service: General;  Laterality: Right;    I have reviewed the social history and family history with the patient and they are unchanged from  previous note.  ALLERGIES:  is allergic to metaxalone, chocolate, cocoa, and penicillins.  MEDICATIONS:  Current Outpatient Medications  Medication Sig Dispense Refill   Ascorbic Acid (VITAMIN C PO) Take 500 mg by mouth daily.      Calcium Carb-Cholecalciferol (CALCIUM + D3) 600-200 MG-UNIT TABS 1 tablet     carisoprodol (SOMA) 350 MG tablet 1 tablet as needed     Cholecalciferol (D3) 50 MCG (2000 UT) TABS       Multiple Vitamin (MULTI-VITAMIN PO) Take by mouth.     NONFORMULARY OR COMPOUNDED ITEM Vitamin E 200 u/ml vaginal suppositories.  Place one per vaginal at hs twice weekly. 8 each 11   raloxifene (EVISTA) 60 MG tablet 1 tablet     selenium sulfide (SELSUN) 2.5 % shampoo      tretinoin microspheres (RETIN-A MICRO) 0.1 % gel Apply 1 application topically at bedtime.      valACYclovir (VALTREX) 1000 MG tablet TAKE 1 TABLET(1000 MG) BY MOUTH AT BEDTIME.  Take one tablet (1000 mg) by mouth twice a day for 3 days for genital outbreak and take two tablets (2000 mg) by mouth every 12 hours for 24 hours for an oral outbreak. 110 tablet 3   VITAMIN E EX Apply topically. suppository     No current facility-administered medications for this visit.    PHYSICAL EXAMINATION: ECOG PERFORMANCE STATUS: 0 - Asymptomatic  Vitals:   08/24/21 1035  BP: (!) 134/92  Pulse: 68  Resp: 16  Temp: 98.6 F (37 C)  SpO2: 97%   Filed Weights   08/24/21 1035  Weight: 158 lb 8 oz (71.9 kg)    GENERAL:alert, no distress and comfortable SKIN: Mild erythematous rash under the left breast fold EYES: sclera clear NECK: Without mass LYMPH:  no palpable cervical or supraclavicular lymphadenopathy  LUNGS:  normal breathing effort HEART: no lower extremity edema ABDOMEN:abdomen soft, non-tender and normal bowel sounds.  No palpable hepatomegaly or mass Musculoskeletal: No focal spinal tenderness NEURO: alert & oriented x 3 with fluent speech, no focal motor/sensory deficits Breast exam: S/p right mastectomy, incision completely healed.  Inferior to the scar at the medial edge there is a 2 cm palpable area of tenderness with soft tissue density.  No palpable mass or nodularity in the left breast or either axilla that I could appreciate.  No left nipple discharge or inversion.  LABORATORY DATA:  I have reviewed the data as listed    Latest Ref Rng & Units 08/24/2021   10:21 AM 02/24/2021    2:24 PM 06/03/2020    12:18 PM  CBC  WBC 4.0 - 10.5 K/uL 5.1  5.6  5.6   Hemoglobin 12.0 - 15.0 g/dL 14.9  13.5  14.9   Hematocrit 36.0 - 46.0 % 43.4  40.3  44.7   Platelets 150 - 400 K/uL 238  240  236         Latest Ref Rng & Units 08/24/2021   10:21 AM 02/24/2021    2:24 PM 06/03/2020   12:18 PM  CMP  Glucose 70 - 99 mg/dL 106  106  94   BUN 8 - 23 mg/dL _0 Creatinine 0.44 - 1.00 mg/dL 0.52  0.61  0.74   Sodium 135 - 145 mmol/L 139  139  140   Potassium 3.5 - 5.1 mmol/L 4.0  4.2  4.0   Chloride 98 - 111 mmol/L 105  104  103   CO2 22 - 32 mmol/L  _0 Calcium 8.9 - 10.3 mg/dL 9.1  9.4  9.5   Total Protein 6.5 - 8.1 g/dL 7.0  6.7  7.1   Total Bilirubin 0.3 - 1.2 mg/dL 1.4  0.9  1.1   Alkaline Phos 38 - 126 U/L 52  60  70   AST 15 - 41 U/L _1 ALT 0 - 44 U/L _2 RADIOGRAPHIC STUDIES: I have personally reviewed the radiological images as listed and agreed with the findings in the report. No results found.   ASSESSMENT & PLAN: Maureen Figueroa is a 71 y.o. female with    1. Right breast DCIS, intermediate grade, ER+/PR- and ALH -S/p right mastectomy on 08/03/20 by Dr. Ninfa Linden.  -Postmastectomy radiation was not recommended  -She began raloxifene on 11/25/2020, Plan for total of 5 years -Left mammo 05/06/2021 was benign, breast density category C.  She would like to proceed with screening breast MRI this year, will do this in October, 6 months after last mammogram.  -Maureen Figueroa is clinically doing well.  Tolerating raloxifene with hot flashes.  Breast exam shows a 2 cm area of soft tissue density with tenderness, otherwise negative.  We discussed possible etiologies including scar tissue, seroma, or other.  She has been referred for ultrasound of the chest wall to further evaluate.  -Labs are unremarkable except T. bili 1.4, no recent imaging to review.  She notes her daughter has been told this recently.  I recommend a lower fat diet and to cut alcohol.  Repeat CMP  in 8 weeks.  If T. bili is persistently elevated will likely recommend liver ultrasound. -We will call her with results of the above work-up. If negative, continue breast cancer surveillance and raloxifene; follow-up in 6 months   2.  Breast prosthesis  -She struggles to get a well fitting right breast prosthesis -She went without a prosthetic for 1 month, had posture issues, felt out of alignment, and had right neck soreness/tightness.   -I recommend a custom prosthesis, will send this note to [employer] to support any insurance claim patient may submit   3. Genetics -She has family history of breast cancer (sister and PGM) and bladder cancer (Father).  -Testing 06/03/20 was negative.   4. Alcohol use -She had increased alcohol use since the pandemic in 2020, currently 2-3 glasses a day.  She cut back but continues to use alcohol to cope with stress -T. bili elevated to 1.4 today, previously normal.  Recommend to continue cutting alcohol and will check in 4-6 weeks  5. Osteopenia -She has history of osteoporosis and s/p 5 years of Fosamax, she now has osteopenia. -raloxifene can help strengthen her bone.   6. Social support -Social work support for relationship status as needed   6. Endometriosis, Hormonal replacement, HSV -on Acyclovir -Vaginal dryness improved with vitamin E suppository   Plan: -Labs reviewed, repeat CMP in 8 weeks (patient will improve diet and reduce/cut alcohol in the meantime); if T. bili remains elevated will likely proceed with liver ultrasound -Right chest wall ultrasound in 1-2 weeks to evaluate soft tissue density at the mastectomy site, I will call with results -CC note to [employer] to support custom prosthesis -Screening breast MRI in October -if work up is negative, continue surveillance and raloxifene -Follow-up in 6 months, or sooner if needed   Orders Placed This Encounter  Procedures  US BREAST COMPLETE UNI RIGHT INC AXILLA     Standing Status:   Future    Standing Expiration Date:   08/25/2022    Scheduling Instructions:     [facility]    Order Specific Question:   Reason for Exam (SYMPTOM  OR DIAGNOSIS REQUIRED)    Answer:   tender soft tissue density at R mastectomy scar, below scar at medial edge    Order Specific Question:   Preferred imaging location?    Answer:   External   MR BREAST BILATERAL W WO CONTRAST INC CAD    Standing Status:   Future    Standing Expiration Date:   08/25/2022    Order Specific Question:   If indicated for the ordered procedure, I authorize the administration of contrast media per Radiology protocol    Answer:   Yes    Order Specific Question:   What is the patient's sedation requirement?    Answer:   No Sedation    Order Specific Question:   Does the patient have a pacemaker or implanted devices?    Answer:   No    Order Specific Question:   Preferred imaging location?    Answer:   [facility] (table limit - 550 lbs)   CMP ([facility] only)    Standing Status:   Future    Standing Expiration Date:   08/25/2022   All questions were answered. The patient knows to call the clinic with any problems, questions or concerns. No barriers to learning was detected. I spent 20 minutes counseling the patient face to face. The total time spent in the appointment was 30 minutes and more than 50% was on counseling and review of test results and coordination of care.      Alla Feeling, NP 08/24/21

## 2021-09-06 ENCOUNTER — Encounter: Payer: Self-pay | Admitting: Nurse Practitioner

## 2021-09-21 ENCOUNTER — Other Ambulatory Visit: Payer: Self-pay | Admitting: Nurse Practitioner

## 2021-10-19 ENCOUNTER — Other Ambulatory Visit: Payer: BC Managed Care – PPO

## 2021-10-19 ENCOUNTER — Encounter: Payer: Self-pay | Admitting: Hematology

## 2021-10-20 ENCOUNTER — Inpatient Hospital Stay: Payer: Medicare PPO | Attending: Nurse Practitioner

## 2021-10-20 ENCOUNTER — Other Ambulatory Visit: Payer: Self-pay

## 2021-10-20 DIAGNOSIS — D0511 Intraductal carcinoma in situ of right breast: Secondary | ICD-10-CM | POA: Diagnosis present

## 2021-10-20 LAB — CMP (CANCER CENTER ONLY)
ALT: 16 U/L (ref 0–44)
AST: 12 U/L — ABNORMAL LOW (ref 15–41)
Albumin: 4.5 g/dL (ref 3.5–5.0)
Alkaline Phosphatase: 60 U/L (ref 38–126)
Anion gap: 7 (ref 5–15)
BUN: 14 mg/dL (ref 8–23)
CO2: 29 mmol/L (ref 22–32)
Calcium: 9.2 mg/dL (ref 8.9–10.3)
Chloride: 105 mmol/L (ref 98–111)
Creatinine: 0.46 mg/dL (ref 0.44–1.00)
GFR, Estimated: >60 mL/min (ref >60)
Glucose, Bld: 96 mg/dL (ref 70–99)
Potassium: 4 mmol/L (ref 3.5–5.1)
Sodium: 141 mmol/L (ref 135–145)
Total Bilirubin: 1.2 mg/dL (ref 0.3–1.2)
Total Protein: 7.4 g/dL (ref 6.5–8.1)

## 2021-12-06 ENCOUNTER — Encounter: Payer: Self-pay | Admitting: Nurse Practitioner

## 2021-12-06 ENCOUNTER — Other Ambulatory Visit: Payer: Self-pay | Admitting: Nurse Practitioner

## 2021-12-06 ENCOUNTER — Other Ambulatory Visit: Payer: Self-pay | Admitting: Internal Medicine

## 2021-12-06 DIAGNOSIS — D0511 Intraductal carcinoma in situ of right breast: Secondary | ICD-10-CM

## 2021-12-06 DIAGNOSIS — M8588 Other specified disorders of bone density and structure, other site: Secondary | ICD-10-CM

## 2021-12-06 DIAGNOSIS — M5431 Sciatica, right side: Secondary | ICD-10-CM

## 2021-12-07 ENCOUNTER — Telehealth: Payer: Self-pay

## 2021-12-07 ENCOUNTER — Other Ambulatory Visit: Payer: Self-pay

## 2021-12-07 NOTE — Telephone Encounter (Signed)
Called patient made her aware that her MRI was approved and she needs to call to make the appointment needs to be done by 01/05/22.

## 2021-12-21 ENCOUNTER — Ambulatory Visit
Admission: RE | Admit: 2021-12-21 | Discharge: 2021-12-21 | Disposition: A | Discharge status: Home / Self Care | Payer: Medicare PPO | Source: Ambulatory Visit | Attending: Internal Medicine | Admitting: Internal Medicine

## 2021-12-21 DIAGNOSIS — M5431 Sciatica, right side: Secondary | ICD-10-CM

## 2021-12-21 DIAGNOSIS — M8588 Other specified disorders of bone density and structure, other site: Secondary | ICD-10-CM

## 2021-12-23 ENCOUNTER — Ambulatory Visit (HOSPITAL_COMMUNITY)
Admission: RE | Admit: 2021-12-23 | Discharge: 2021-12-23 | Disposition: A | Discharge status: Home / Self Care | Payer: Medicare PPO | Source: Ambulatory Visit | Attending: Nurse Practitioner | Admitting: Nurse Practitioner

## 2021-12-23 DIAGNOSIS — D0511 Intraductal carcinoma in situ of right breast: Secondary | ICD-10-CM | POA: Insufficient documentation

## 2021-12-23 MED ORDER — GADOTERIDOL 279.3 MG/ML IV SOLN
8.5000 mL | Freq: Once | INTRAVENOUS | Status: AC | PRN
  Administered 2021-12-23: 8.5 mL via INTRAVENOUS

## 2021-12-23 MED ORDER — GADOBUTROL 1 MMOL/ML IV SOLN
7.0000 mL | Freq: Once | INTRAVENOUS | Status: AC | PRN
  Administered 2021-12-23: 7 mL via INTRAVENOUS

## 2021-12-24 ENCOUNTER — Other Ambulatory Visit: Payer: Self-pay | Admitting: Nurse Practitioner

## 2021-12-24 DIAGNOSIS — R9389 Abnormal findings on diagnostic imaging of other specified body structures: Secondary | ICD-10-CM

## 2021-12-28 ENCOUNTER — Telehealth: Payer: Self-pay

## 2021-12-28 NOTE — Telephone Encounter (Signed)
Called patient and gave her the number for [facility] to set up MRI guided biopsy as per Cira Rue NP    Alla Feeling, NP sent to Elayne Guerin, CMA Derek Huneycutt this pt prefers to schedule her own MRI guided biopsy due to some conflicts. You can leave the number on her voicemail so that she can call directly. Thanks!

## 2021-12-28 NOTE — Telephone Encounter (Signed)
-----   Message from Alla Feeling, NP sent at 12/28/2021  1:49 PM EST ----- Jenny Reichmann this pt prefers to schedule her own MRI guided biopsy due to some conflicts. You can leave the number on her voicemail so that she can call directly.  Thanks!

## 2022-01-06 ENCOUNTER — Other Ambulatory Visit (HOSPITAL_COMMUNITY): Payer: Self-pay | Admitting: Diagnostic Radiology

## 2022-01-06 ENCOUNTER — Ambulatory Visit
Admission: RE | Admit: 2022-01-06 | Discharge: 2022-01-06 | Disposition: A | Discharge status: Home / Self Care | Payer: Medicare PPO | Source: Ambulatory Visit | Attending: Nurse Practitioner | Admitting: Nurse Practitioner

## 2022-01-06 DIAGNOSIS — R9389 Abnormal findings on diagnostic imaging of other specified body structures: Secondary | ICD-10-CM

## 2022-01-06 MED ORDER — GADOPICLENOL 0.5 MMOL/ML IV SOLN
7.0000 mL | Freq: Once | INTRAVENOUS | Status: AC | PRN
  Administered 2022-01-06: 7 mL via INTRAVENOUS

## 2022-01-12 ENCOUNTER — Encounter: Payer: Self-pay | Admitting: Nurse Practitioner

## 2022-02-24 ENCOUNTER — Encounter: Payer: Self-pay | Admitting: Hematology

## 2022-02-24 ENCOUNTER — Inpatient Hospital Stay: Payer: Medicare PPO

## 2022-02-24 ENCOUNTER — Other Ambulatory Visit: Payer: Self-pay

## 2022-02-24 ENCOUNTER — Inpatient Hospital Stay: Payer: Medicare PPO | Attending: Nurse Practitioner | Admitting: Hematology

## 2022-02-24 ENCOUNTER — Telehealth: Payer: Self-pay | Admitting: *Deleted

## 2022-02-24 VITALS — BP 141/87 | HR 79 | Temp 97.8°F | Resp 15 | Ht 65.25 in | Wt 161.9 lb

## 2022-02-24 DIAGNOSIS — Z780 Asymptomatic menopausal state: Secondary | ICD-10-CM | POA: Diagnosis not present

## 2022-02-24 DIAGNOSIS — Z803 Family history of malignant neoplasm of breast: Secondary | ICD-10-CM | POA: Diagnosis not present

## 2022-02-24 DIAGNOSIS — M8580 Other specified disorders of bone density and structure, unspecified site: Secondary | ICD-10-CM | POA: Diagnosis not present

## 2022-02-24 DIAGNOSIS — Z9011 Acquired absence of right breast and nipple: Secondary | ICD-10-CM | POA: Diagnosis not present

## 2022-02-24 DIAGNOSIS — D0511 Intraductal carcinoma in situ of right breast: Secondary | ICD-10-CM | POA: Insufficient documentation

## 2022-02-24 DIAGNOSIS — Z79810 Long term (current) use of selective estrogen receptor modulators (SERMs): Secondary | ICD-10-CM | POA: Diagnosis not present

## 2022-02-24 DIAGNOSIS — Z1231 Encounter for screening mammogram for malignant neoplasm of breast: Secondary | ICD-10-CM | POA: Diagnosis not present

## 2022-02-24 LAB — CBC WITH DIFFERENTIAL (CANCER CENTER ONLY)
Abs Immature Granulocytes: 0.01 10*3/uL (ref 0.00–0.07)
Basophils Absolute: 0 10*3/uL (ref 0.0–0.1)
Basophils Relative: 0 %
Eosinophils Absolute: 0.1 10*3/uL (ref 0.0–0.5)
Eosinophils Relative: 2 %
HCT: 41.4 % (ref 36.0–46.0)
Hemoglobin: 14.3 g/dL (ref 12.0–15.0)
Immature Granulocytes: 0 %
Lymphocytes Relative: 35 %
Lymphs Abs: 1.8 10*3/uL (ref 0.7–4.0)
MCH: 34 pg (ref 26.0–34.0)
MCHC: 34.5 g/dL (ref 30.0–36.0)
MCV: 98.6 fL (ref 80.0–100.0)
Monocytes Absolute: 0.3 10*3/uL (ref 0.1–1.0)
Monocytes Relative: 7 %
Neutro Abs: 2.9 10*3/uL (ref 1.7–7.7)
Neutrophils Relative %: 56 %
Platelet Count: 230 10*3/uL (ref 150–400)
RBC: 4.2 MIL/uL (ref 3.87–5.11)
RDW: 12.4 % (ref 11.5–15.5)
WBC Count: 5.1 10*3/uL (ref 4.0–10.5)
nRBC: 0 % (ref 0.0–0.2)

## 2022-02-24 LAB — CMP (CANCER CENTER ONLY)
ALT: 13 U/L (ref 0–44)
AST: 10 U/L — ABNORMAL LOW (ref 15–41)
Albumin: 4.4 g/dL (ref 3.5–5.0)
Alkaline Phosphatase: 59 U/L (ref 38–126)
Anion gap: 5 (ref 5–15)
BUN: 15 mg/dL (ref 8–23)
CO2: 28 mmol/L (ref 22–32)
Calcium: 9.2 mg/dL (ref 8.9–10.3)
Chloride: 105 mmol/L (ref 98–111)
Creatinine: 0.55 mg/dL (ref 0.44–1.00)
GFR, Estimated: >60 mL/min (ref >60)
Glucose, Bld: 89 mg/dL (ref 70–99)
Potassium: 4.2 mmol/L (ref 3.5–5.1)
Sodium: 138 mmol/L (ref 135–145)
Total Bilirubin: 1 mg/dL (ref 0.3–1.2)
Total Protein: 6.9 g/dL (ref 6.5–8.1)

## 2022-02-24 MED ORDER — RALOXIFENE HCL 60 MG PO TABS: 60.0000 mg | ORAL_TABLET | Freq: Every day | ORAL | 3 refills | Status: DC

## 2022-02-24 NOTE — Assessment & Plan Note (Signed)
-  She opted to proceed with right mastectomy on 08/03/20 by Dr. Ninfa Linden. Pathology again showed intermediate grade DCIS, 0.9 cm, margins uninvolved. She does not need postmastectomy radiation therapy. -she started raloxifene on 11/25/20. She is tolerating well. We will plan for 5 years.

## 2022-02-24 NOTE — Progress Notes (Signed)
[place]   [phone] [phone]   Clinic Follow up Note   Patient Care Team: Charlane Ferretti, MD as PCP - General (Internal Medicine) Mauro Kaufmann, RN as Oncology Nurse Navigator Rockwell Germany, RN as Oncology Nurse Navigator Coralie Keens, MD as Consulting Physician (General Surgery) Truitt Merle, MD as Consulting Physician (Hematology) Kyung Rudd, MD as Consulting Physician (Radiation Oncology) Alla Feeling, NP as Nurse Practitioner (Nurse Practitioner)  Date of Service:  02/24/2022  CHIEF COMPLAINT: f/u of right breast DCIS   CURRENT THERAPY: Raloxifene, started 11/25/2020    ASSESSMENT:  Maureen Figueroa is a 72 y.o. female with   Ductal carcinoma in situ (DCIS) of right breast -She opted to proceed with right mastectomy on 08/03/20 by Dr. Ninfa Linden. Pathology again showed intermediate grade DCIS, 0.9 cm, margins uninvolved. She does not need postmastectomy radiation therapy. -she started raloxifene on 11/25/20. She is tolerating well. We will plan for 5 years.  Osteopenia after menopause She has history of osteoporosis and s/p 5 years of Fosamax, she now has osteopenia. -raloxifene can help strengthen her bone.     PLAN: -lab reviewed - Continue Raloxifene tolerating it very well. -Mammogram in April - order MRI to be done in Oct  -f/u in 1 year  SUMMARY OF ONCOLOGIC HISTORY: Oncology History Overview Note  Cancer Staging Ductal carcinoma in situ (DCIS) of right breast Staging form: Breast, AJCC 8th Edition - Clinical stage from 06/03/2020: Stage 0 (cTis (DCIS), cN0, cM0, ER+, PR+, HER2: Not Assessed) - Unsigned Stage prefix: Initial diagnosis Nuclear grade: G2 Laterality: Right Staged by: Pathologist and managing physician Stage used in treatment planning: Yes National guidelines used in treatment planning: Yes Type of national guideline used in treatment planning: NCCN - Pathologic stage from 08/03/2020: Stage  Unknown (pTis (DCIS), pNX, cM0, G2, ER+, PR-, HER2: Not Assessed) - Signed by Alla Feeling, NP on 11/25/2020 Histologic grading system: 3 grade system    Ductal carcinoma in situ (DCIS) of right breast  05/13/2020 Mammogram   Impression: The 1.8cm grouped amophous calcifications in teh right breasr upper outer aspect middle depth 7cmfn are suspicious. A biopsy is recommended.    05/26/2020 Initial Biopsy   Diagnosis Breast, right, nmededle core biopsy, 12:00, 7cmfn -DUCTAL CARCINOMA IN SITU WITH CALCIFICATIONS.  The DCIS has intermediate nuclear grade.     05/29/2020 Initial Diagnosis   Ductal carcinoma in situ (DCIS) of right breast   06/14/2020 Genetic Testing   Negative genetic testing:  No pathogenic variants detected on the [facility] BRCAplus panel and CancerNext-Expanded + RNAinsight panel. The report dates are 06/14/2020 and 07/02/2020, respectively. A variant of uncertain significance (VUS) was detected in the DICER1 gene called p.S299L (c.896C>T).  The BRCAplus panel offered by [employer] and includes sequencing and deletion/duplication analysis for the following 8 genes: ATM, BRCA1, BRCA2, CDH1, CHEK2, PALB2, PTEN, and TP53. The CancerNext-Expanded + RNAinsight gene panel offered by [employer] and includes sequencing and rearrangement analysis for the following 77 genes: AIP, ALK, APC, ATM, AXIN2, BAP1, BARD1, BLM, BMPR1A, BRCA1, BRCA2, BRIP1, CDC73, CDH1, CDK4, CDKN1B, CDKN2A, CHEK2, CTNNA1, DICER1, FANCC, FH, FLCN, GALNT12, KIF1B, LZTR1, MAX, MEN1, MET, MLH1, MSH2, MSH3, MSH6, MUTYH, NBN, NF1, NF2, NTHL1, PALB2, PHOX2B, PMS2, POT1, PRKAR1A, PTCH1, PTEN, RAD51C, RAD51D, RB1, RECQL, RET, SDHA, SDHAF2, SDHB, SDHC, SDHD, SMAD4, SMARCA4, SMARCB1, SMARCE1, STK11, SUFU, TMEM127, TP53, TSC1, TSC2, VHL and XRCC2 (sequencing and deletion/duplication); EGFR, EGLN1, HOXB13, KIT, MITF, PDGFRA, POLD1 and POLE (sequencing only);  EPCAM and GREM1 (deletion/duplication only). RNA data is routinely  analyzed for use in variant interpretation for all genes.   06/15/2020 Imaging   Bilateral Breast MRI  IMPRESSION: 1. There is ill-defined non mass enhancement in the central right breast at 12 o'clock spanning up to 4.5 cm. Review of the outside mammogram demonstrates a group of calcifications anterior to the recently biopsied DCIS and another group slightly inferior and lateral to the recently diagnosed DCIS. Taking these 2 additional groups of calcifications into account, the total span of calcifications is 5.1 cm likely correlating with the non mass enhancement seen today. The non mass enhancement today and the additional groups of calcifications could also represent DCIS. 2. No other abnormalities in either breast.   07/01/2020 Pathology Results   Diagnosis 1. Breast, right, needle core biopsy - INTERMEDIATE TO HIGH-GRADE DUCTAL CARCINOMA IN SITU WITH FOCAL NECROSIS AND CALCIFICATIONS. SEE NOTE 2. Breast, right, needle core biopsy - ATYPICAL LOBULAR HYPERPLASIA. SEE NOTE - FIBROCYSTIC CHANGE WITH CALCIFICATIONS  1. PROGNOSTIC INDICATORS Results: IMMUNOHISTOCHEMICAL AND MORPHOMETRIC ANALYSIS PERFORMED MANUALLY Estrogen Receptor: 95%, POSITIVE, STRONG STAINING INTENSITY Progesterone Receptor: 0%, NEGATIVE   08/03/2020 Pathology Results   FINAL MICROSCOPIC DIAGNOSIS:   A. BREAST, RIGHT, MASTECTOMY:  -  Ductal carcinoma in situ, intermediate grade, 0.9 cm  -  Margins uninvolved by carcinoma (2.0; posterior margin)  -  Lobular neoplasia (atypical lobular hyperplasia)  -  Fibrocystic changes  -  Calcifications associated with carcinoma and benign breast tissue  -  Previous biopsy site changes    08/03/2020 Cancer Staging   Staging form: Breast, AJCC 8th Edition - Pathologic stage from 08/03/2020: Stage Unknown (pTis (DCIS), pNX, cM0, G2, ER+, PR-, HER2: Not Assessed) - Signed by Alla Feeling, NP on 11/25/2020 Histologic grading system: 3 grade system   11/25/2020 Survivorship    SCP delivered by Cira Rue, NP   11/25/2020 -  Anti-estrogen oral therapy   Begin Raloxifene 60 mg po once daily, goal 5 years      INTERVAL HISTORY:  Maureen Figueroa is here for a follow up of right breast DCIS  She was last seen by NP Lacie on 08/24/2021 She presents to the clinic alone. Pt states she is on Orthotics and a heavy anti inflammatory. Pt states her hot flashes are manageable now. Pt states she start physical therapy next week.   All other systems were reviewed with the patient and are negative.  MEDICAL HISTORY:  Past Medical History:  Diagnosis Date   Back injury 1994   back/neck injury    Cancer ([place])    Breast cancer--right   Endometriosis    Family history of breast cancer    Family history of kidney cancer    Family history of lung cancer    Lyme disease, unspecified    followed by PCP   Osteoporosis    Pinched nerve in neck 12/2013   Postmenopausal bleeding 2016   uterine fibroid, on HRT   STD (sexually transmitted disease)    HSV   Urinary incontinence     SURGICAL HISTORY: Past Surgical History:  Procedure Laterality Date   BREAST SURGERY  07-23-08   benign Rt.breast biopsy--benign stromal fibrosis with calcifications:[facility]   CATARACT EXTRACTION, BILATERAL Bilateral 11/2016, 03/2017   EYE SURGERY Right    cyst removed x2    LASIK     OOPHORECTOMY  12/86   PELVIC LAPAROSCOPY  1986   endometriosis   TOTAL MASTECTOMY Right 08/03/2020   Procedure: RIGHT TOTAL  MASTECTOMY;  Surgeon: Coralie Keens, MD;  Location: [place];  Service: General;  Laterality: Right;    I have reviewed the social history and family history with the patient and they are unchanged from previous note.  ALLERGIES:  is allergic to metaxalone, chocolate, cocoa, and penicillins.  MEDICATIONS:  Current Outpatient Medications  Medication Sig Dispense Refill   Ascorbic Acid (VITAMIN C PO) Take 500 mg by mouth daily.      Calcium Carb-Cholecalciferol  (CALCIUM + D3) 600-200 MG-UNIT TABS 1 tablet     carisoprodol (SOMA) 350 MG tablet 1 tablet as needed     Cholecalciferol (D3) 50 MCG (2000 UT) TABS      Multiple Vitamin (MULTI-VITAMIN PO) Take by mouth.     NONFORMULARY OR COMPOUNDED ITEM Vitamin E 200 u/ml vaginal suppositories.  Place one per vaginal at hs twice weekly. 8 each 11   raloxifene (EVISTA) 60 MG tablet Take 1 tablet (60 mg total) by mouth daily. 90 tablet 3   selenium sulfide (SELSUN) 2.5 % shampoo      tretinoin microspheres (RETIN-A MICRO) 0.1 % gel Apply 1 application topically at bedtime.      valACYclovir (VALTREX) 1000 MG tablet TAKE 1 TABLET(1000 MG) BY MOUTH AT BEDTIME.  Take one tablet (1000 mg) by mouth twice a day for 3 days for genital outbreak and take two tablets (2000 mg) by mouth every 12 hours for 24 hours for an oral outbreak. 110 tablet 3   VITAMIN E EX Apply topically. suppository     No current facility-administered medications for this visit.    PHYSICAL EXAMINATION: ECOG PERFORMANCE STATUS: 0 - Asymptomatic  Vitals:   02/24/22 1429  BP: (!) 141/87  Pulse: 79  Resp: 15  Temp: 97.8 F (36.6 C)  SpO2: 98%   Wt Readings from Last 3 Encounters:  02/24/22 161 lb 14.4 oz (73.4 kg)  08/24/21 158 lb 8 oz (71.9 kg)  06/03/21 158 lb (71.7 kg)    GENERAL:alert, no distress and comfortable SKIN: skin color, texture, turgor are normal, no rashes or significant lesions EYES: normal, Conjunctiva are pink and non-injected, sclera clear NECK: (-)supple, thyroid normal size, non-tender, without nodularity LYMPH: (-) no palpable lymphadenopathy in the cervical, axillary  ABDOMEN:abdomen soft, non-tender and normal bowel sounds Musculoskeletal:no cyanosis of digits and no clubbing  NEURO: alert & oriented x 3 with fluent speech, no focal motor/sensory deficits BREAST:Rt Breast mastectomy no palpable mass, left breast exam benign.  No palpable axillary adenopathy.     LABORATORY DATA:  I have reviewed the  data as listed    Latest Ref Rng & Units 02/24/2022    1:56 PM 08/24/2021   10:21 AM 02/24/2021    2:24 PM  CBC  WBC 4.0 - 10.5 K/uL 5.1  5.1  5.6   Hemoglobin 12.0 - 15.0 g/dL 14.3  14.9  13.5   Hematocrit 36.0 - 46.0 % 41.4  43.4  40.3   Platelets 150 - 400 K/uL 230  238  240         Latest Ref Rng & Units 02/24/2022    1:56 PM 10/20/2021    3:52 PM 08/24/2021   10:21 AM  CMP  Glucose 70 - 99 mg/dL 89  96  106   BUN 8 - 23 mg/dL '15  14  17   '$ Creatinine 0.44 - 1.00 mg/dL 0.55  0.46  0.52   Sodium 135 - 145 mmol/L 138  141  139   Potassium  3.5 - 5.1 mmol/L 4.2  4.0  4.0   Chloride 98 - 111 mmol/L 105  105  105   CO2 22 - 32 mmol/L '28  29  27   '$ Calcium 8.9 - 10.3 mg/dL 9.2  9.2  9.1   Total Protein 6.5 - 8.1 g/dL 6.9  7.4  7.0   Total Bilirubin 0.3 - 1.2 mg/dL 1.0  1.2  1.4   Alkaline Phos 38 - 126 U/L 59  60  52   AST 15 - 41 U/L '10  12  12   '$ ALT 0 - 44 U/L '13  16  16       '$ RADIOGRAPHIC STUDIES: I have personally reviewed the radiological images as listed and agreed with the findings in the report. No results found.    Orders Placed This Encounter  Procedures   MM Digital Screening Unilat L    Standing Status:   Future    Standing Expiration Date:   02/24/2023    Scheduling Instructions:     [facility]    Order Specific Question:   Reason for Exam (SYMPTOM  OR DIAGNOSIS REQUIRED)    Answer:   screening    Order Specific Question:   Preferred imaging location?    Answer:   External   MR BREAST BILATERAL W WO CONTRAST INC CAD    Standing Status:   Future    Standing Expiration Date:   02/25/2023    Order Specific Question:   If indicated for the ordered procedure, I authorize the administration of contrast media per Radiology protocol    Answer:   Yes    Order Specific Question:   What is the patient's sedation requirement?    Answer:   No Sedation    Order Specific Question:   Does the patient have a pacemaker or implanted devices?    Answer:   No    Order Specific Question:    Radiology Contrast Protocol - do NOT remove file path    Answer:   [email or web address]    Order Specific Question:   Preferred imaging location?    Answer:   [facility] (table limit-550lbs)   All questions were answered. The patient knows to call the clinic with any problems, questions or concerns. No barriers to learning was detected. The total time spent in the appointment was 30 minutes.     Truitt Merle, MD 02/24/2022   Felicity Coyer, CMA, am acting as scribe for Truitt Merle, MD.   I have reviewed the above documentation for accuracy and completeness, and I agree with the above.

## 2022-02-24 NOTE — Telephone Encounter (Signed)
Receptionist called this nurse at 1420 and 1501 to speak with Theodoro Parma about [facility], [place] paperwork.  Connected with patient before and after today's provider visit.  Obtained [employer].  Advised form staff will complete form.  Provided tip sheet with Patient Accounting and Medical Records phone numbers and form email [email or web address] to scan surgical and [facility] billing information to scan for return with completed and signed employee information   Demonstrated how to obtain [place] from [email or web address].  Expecting second claim form for critical Illness.

## 2022-02-24 NOTE — Assessment & Plan Note (Signed)
She has history of osteoporosis and s/p 5 years of Fosamax, she now has osteopenia. -raloxifene can help strengthen her bone.

## 2022-02-26 ENCOUNTER — Encounter: Payer: Self-pay | Admitting: Nurse Practitioner

## 2022-03-02 DIAGNOSIS — M7061 Trochanteric bursitis, right hip: Secondary | ICD-10-CM | POA: Diagnosis not present

## 2022-03-14 NOTE — Telephone Encounter (Signed)
[employer] and Critical Illness claim paperwork completed by this nurse at this time.  Folder placed in designated mail bin for collaborative pick up for provider review, signature, and return.

## 2022-03-16 NOTE — Telephone Encounter (Signed)
Paperwork returned and received by this nurse today from collaborative signed by provider.   Message left for Maureen Figueroa, [phone] (home) to return call to this nurse.  Spoke last when form delivered on 02/24/2022.  Awaiting Maureen Figueroa complete employee sections, obtain billing information to "return initial claim of two years of cancer claim".  Awaiting return call for possible in person review or return form to Maureen Figueroa to complete.

## 2022-03-16 NOTE — Telephone Encounter (Signed)
Maureen Figueroa returned call.  "Sorry did not get back to you.  Have been trying to find new computer, working half time and unable to download other form or collect information needed.  This week is not good for me.  Will call when able to access my calendar with times next week on 03/22/2022 or 03/23/2022." Aware this nurse out of office 03/17/2022 and 03/21/2022.  No further questions or needs.

## 2022-03-25 NOTE — Telephone Encounter (Signed)
10:28: "PAXTON PAMPLONA, [phone] (home) .  Will try to call next Monday for an appointment.  Still gathering information." 1446: "Trying to gather additional information finding very confusing.  Would like talking with you by phone this afternoon.  Appreciate your help.l" 1747: Connected with Theodoro Parma.  "Still awaiting the Critical Care insurance.  Prefer to deliver not fax or e-mail.  Probably a matter of copying information.  Need to log into [employer] to obtain policy and certificate numbers.  Able to meet 4:30 pm next Tuesday."  Agreed to meet early afternoon 03/28/2021 at 1:00 pm.  Provided direct extension [phone] to call upon arrival.

## 2022-04-06 DIAGNOSIS — M7061 Trochanteric bursitis, right hip: Secondary | ICD-10-CM | POA: Diagnosis not present

## 2022-04-07 ENCOUNTER — Other Ambulatory Visit: Payer: Self-pay | Admitting: Obstetrics and Gynecology

## 2022-04-07 DIAGNOSIS — B009 Herpesviral infection, unspecified: Secondary | ICD-10-CM

## 2022-04-08 NOTE — Telephone Encounter (Signed)
BS pt.  Last AEX 06/03/2021--scheduled for 07/11/2022.

## 2022-05-02 DIAGNOSIS — M7061 Trochanteric bursitis, right hip: Secondary | ICD-10-CM | POA: Diagnosis not present

## 2022-05-16 DIAGNOSIS — D0511 Intraductal carcinoma in situ of right breast: Secondary | ICD-10-CM | POA: Diagnosis not present

## 2022-05-18 DIAGNOSIS — Z853 Personal history of malignant neoplasm of breast: Secondary | ICD-10-CM | POA: Diagnosis not present

## 2022-05-18 DIAGNOSIS — N390 Urinary tract infection, site not specified: Secondary | ICD-10-CM | POA: Diagnosis not present

## 2022-05-18 DIAGNOSIS — Z1231 Encounter for screening mammogram for malignant neoplasm of breast: Secondary | ICD-10-CM | POA: Diagnosis not present

## 2022-05-23 ENCOUNTER — Encounter: Payer: Self-pay | Admitting: Obstetrics and Gynecology

## 2022-05-26 ENCOUNTER — Encounter: Payer: Self-pay | Admitting: Nurse Practitioner

## 2022-05-26 DIAGNOSIS — R300 Dysuria: Secondary | ICD-10-CM | POA: Diagnosis not present

## 2022-06-08 DIAGNOSIS — M7061 Trochanteric bursitis, right hip: Secondary | ICD-10-CM | POA: Diagnosis not present

## 2022-06-16 ENCOUNTER — Other Ambulatory Visit: Payer: Self-pay

## 2022-06-16 MED ORDER — NONFORMULARY OR COMPOUNDED ITEM: 0 refills | Status: DC

## 2022-06-16 NOTE — Telephone Encounter (Signed)
Pt requesting refill of Vitamin E suppository. Refill request for Vitamin E self reported. Please confirm if pt uses this prescription. Last AEX: 06/03/21 Next AEX: 07/20/22 Last Mammo- 05/18/22 BI-RADS CAT 2 benign

## 2022-06-17 NOTE — Telephone Encounter (Signed)
Patient calling to inquire on status of Vit E vaginal suppositories.   Rx approved 06/16/22. Rx not sent.   Call placed to [facility], spoke with Pharmacist ED. No Rx on file.   Verbal order given per Dr. Edward Jolly: Vitamin E 200 u/ml vaginal suppositories. Place one per vaginal at hs twice weekly. #8/0RF  Read back and confirmed.   Call returned to patient, notified of refill. Patient verbalizes understanding and is agreeable.   Routing to [employer]

## 2022-06-20 ENCOUNTER — Other Ambulatory Visit: Payer: Self-pay | Admitting: Obstetrics and Gynecology

## 2022-06-20 DIAGNOSIS — B009 Herpesviral infection, unspecified: Secondary | ICD-10-CM

## 2022-06-21 ENCOUNTER — Other Ambulatory Visit: Payer: Self-pay

## 2022-06-21 ENCOUNTER — Encounter (HOSPITAL_COMMUNITY): Payer: Self-pay | Admitting: Emergency Medicine

## 2022-06-21 ENCOUNTER — Ambulatory Visit (HOSPITAL_COMMUNITY)
Admission: EM | Admit: 2022-06-21 | Discharge: 2022-06-21 | Disposition: A | Discharge status: Home / Self Care | Payer: Medicare PPO

## 2022-06-21 ENCOUNTER — Ambulatory Visit (INDEPENDENT_AMBULATORY_CARE_PROVIDER_SITE_OTHER): Payer: Medicare PPO

## 2022-06-21 DIAGNOSIS — R0789 Other chest pain: Secondary | ICD-10-CM

## 2022-06-21 DIAGNOSIS — W19XXXA Unspecified fall, initial encounter: Secondary | ICD-10-CM

## 2022-06-21 DIAGNOSIS — S20212A Contusion of left front wall of thorax, initial encounter: Secondary | ICD-10-CM

## 2022-06-21 MED ORDER — LIDOCAINE 5 % EX PTCH: 1.0000 | MEDICATED_PATCH | CUTANEOUS | 0 refills | Status: DC

## 2022-06-21 NOTE — Discharge Instructions (Signed)
Your x-ray was normal.  No evidence of fracture or broken bones.  I suspect that you have had soft tissue bruising caused by the fall causing your pain.  Continue your meloxicam daily.  You can use Tylenol/acetaminophen over-the-counter.  Take your Tresa Garter as needed.  You can use lidocaine patches during the day for 12 hours and then remove for 12 hours; use only 1 patch per 24 hours.  Use heat and gentle stretch to help manage the symptoms.  If at any point anything worsens and you have severe pain, shortness of breath, worsening cough, coughing up blood, heart racing you need to be seen immediately.

## 2022-06-21 NOTE — ED Triage Notes (Signed)
Larey Seat out of a computer chair Sunday morning.  Patient fell forward on to computer desk.  Patient touches across her chest and under left arm as area of soreness.  Patient reports hitting back of head.  No LOC.  After fall stood up and felt fine.  Patient does physical therapy exercises as usual.  Takes antiinflammatory and tylenol.

## 2022-06-21 NOTE — Telephone Encounter (Signed)
BS pt calling to request refills on valtrex. States she needs ASAP due to plans to travel very soon.   Last AEX 06/03/2021--scheduled for 07/20/2022.

## 2022-06-21 NOTE — ED Provider Notes (Signed)
[facility]    CSN: [id number] Arrival date & time: 06/21/22  1554      History   Chief Complaint Chief Complaint  Patient presents with   Fall    HPI Maureen Figueroa is a 72 y.o. female.   Patient presents today for evaluation of left-sided anterior chest wall pain following injury.  Reports that she was sitting at her desk when she went to stand up as she had her feet tangled in some wires and fell with the majority of her weight onto her chest wall.  She did hit her head but denies any loss of consciousness, headache, dizziness, nausea, vomiting, confusion.  She reports she has taken Tylenol as well as aspirin without improvement of symptoms.  She is on chronic meloxicam for hip pain and has been taking this but has not improved her chest wall pain.  She does not take any blood thinning medication.  Denies any associated shortness of breath, palpitations.  She is able to move her arm without any difficulty.  She wants to ensure that there is no broken rib as she is scheduled to leave in 97s to drive to [place] but is unsure if she would be up to this if there is a significant injury.  Pain is rated 5 on a 0-10 pain scale, described as aching, worse with palpation or certain movements, no alleviating factors identified.    Past Medical History:  Diagnosis Date   Back injury 1994   back/neck injury    Cancer (HCC)    Breast cancer--right   Endometriosis    Family history of breast cancer    Family history of kidney cancer    Family history of lung cancer    Lyme disease, unspecified    followed by PCP   Osteoporosis    Pinched nerve in neck 12/2013   Postmenopausal bleeding 2016   uterine fibroid, on HRT   STD (sexually transmitted disease)    HSV   Urinary incontinence     Patient Active Problem List   Diagnosis Date Noted   Osteopenia after menopause 02/24/2022   S/P mastectomy, right 08/03/2020   Genetic testing 06/14/2020   Family history of breast  cancer    Family history of kidney cancer    Family history of lung cancer    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   Overactive bladder 11/15/2017   Dizziness 12/31/2013   Numbness 12/31/2013   Hypokalemia 12/31/2013   Numbness and tingling 12/31/2013   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   CAVUS DEFORMITY OF FOOT, ACQUIRED 12/25/2008    Past Surgical History:  Procedure Laterality Date   BREAST SURGERY  07-23-08   benign Rt.breast biopsy--benign stromal fibrosis with calcifications:[facility]   CATARACT EXTRACTION, BILATERAL Bilateral 11/2016, 03/2017   EYE SURGERY Right    cyst removed x2    LASIK     OOPHORECTOMY  12/86   PELVIC LAPAROSCOPY  1986   endometriosis   TOTAL MASTECTOMY Right 08/03/2020   Procedure: RIGHT TOTAL MASTECTOMY;  Surgeon: Abigail Miyamoto, MD;  Location: [facility];  Service: General;  Laterality: Right;    OB History     Gravida  1   Para  1   Term      Preterm      AB      Living  1      SAB      IAB      Ectopic  Multiple      Live Births               Home Medications    Prior to Admission medications   Medication Sig Start Date End Date Taking? Authorizing Provider  lidocaine (LIDODERM) 5 % Place 1 patch onto the skin daily. Remove & Discard patch within 12 hours or as directed by MD 06/21/22  Yes Ravon Mcilhenny, Noberto Retort, PA-C  meloxicam (MOBIC) 15 MG tablet Take 1 tablet by mouth daily. 02/17/22  Yes [provider]  Ascorbic Acid (VITAMIN C PO) Take 500 mg by mouth daily.     [provider]  Calcium Carb-Cholecalciferol (CALCIUM + D3) 600-200 MG-UNIT TABS 1 tablet    [provider]  carisoprodol (SOMA) 350 MG tablet 1 tablet as needed 07/31/20   [provider]  Cholecalciferol (D3) 50 MCG (2000 UT) TABS  01/24/18   [provider]  Multiple Vitamin (MULTI-VITAMIN PO) Take by mouth.    [provider]  NONFORMULARY OR COMPOUNDED ITEM Vitamin  E 200 u/ml vaginal suppositories.  Place one per vaginal at hs twice weekly. 06/16/22   Patton Salles, MD  raloxifene (EVISTA) 60 MG tablet Take 1 tablet (60 mg total) by mouth daily. 02/24/22   Malachy Mood, MD  selenium sulfide (SELSUN) 2.5 % shampoo     [provider]  tretinoin microspheres (RETIN-A MICRO) 0.1 % gel Apply 1 application topically at bedtime.  05/18/12   [provider]  valACYclovir (VALTREX) 1000 MG tablet TAKE 1 TABLET DAILY AT BEDTIME. TAKE 1 TABLET TWICE DAILY X 3 DAYS FOR GENITAL OUTBREAK OR 2 TABLETS EVERY 12 HOURS X 24 HOURS FOR AN ORAL OUTBREAK 04/08/22   Genia Del, MD  VITAMIN E EX Apply topically. suppository    [provider]    Family History Family History  Problem Relation Age of Onset   Hypertension Mother    COPD Mother    Hyperlipidemia Mother    Kidney cancer Father 27       dx late 67s   Breast cancer Sister 57   Pancreatic cancer Maternal Aunt        dx 36s (DDT exposure)   Cancer Maternal Aunt        gastrointestinal dx 28s (DDT exposure)   Prostate cancer Maternal Uncle        (DDT exposure)   Osteoporosis Maternal Grandfather    Osteoporosis Paternal Grandmother    Breast cancer Paternal Grandmother 4       mastectomy   Lung cancer Paternal Grandfather        dx late 17s, smoker    Social History Social History   Tobacco Use   Smoking status: Never   Smokeless tobacco: Never  Vaping Use   Vaping Use: Never used  Substance Use Topics   Alcohol use: Yes    Alcohol/week: 14.0 standard drinks of alcohol    Types: 14 Standard drinks or equivalent per week    Comment: daily wine or alcohol since 2020   Drug use: No     Allergies   Metaxalone, Chocolate, Cocoa, and Penicillins   Review of Systems Review of Systems  Constitutional:  Positive for activity change. Negative for appetite change, fatigue and fever.  Eyes:  Negative for photophobia and visual disturbance.  Respiratory:   Negative for cough and shortness of breath.   Cardiovascular:  Positive for chest pain (chest wall pain).  Gastrointestinal:  Negative for abdominal pain,  diarrhea, nausea and vomiting.  Musculoskeletal:  Negative for arthralgias, back pain and myalgias.  Neurological:  Negative for dizziness, syncope, weakness, light-headedness, numbness and headaches.     Physical Exam Triage Vital Signs ED Triage Vitals  Enc Vitals Group     BP 06/21/22 1625 (!) 148/93     Pulse Rate 06/21/22 1625 76     Resp 06/21/22 1625 18     Temp 06/21/22 1625 99.6 F (37.6 C)     Temp Source 06/21/22 1625 Oral     SpO2 06/21/22 1625 95 %     Weight --      Height --      Head Circumference --      Peak Flow --      Pain Score 06/21/22 1623 5     Pain Loc --      Pain Edu? --      Excl. in GC? --    No data found.  Updated Vital Signs BP (!) 148/93 (BP Location: Right Arm)   Pulse 76   Temp 99.6 F (37.6 C) (Oral)   Resp 18   LMP 09/24/2005   SpO2 95%   Visual Acuity Right Eye Distance:   Left Eye Distance:   Bilateral Distance:    Right Eye Near:   Left Eye Near:    Bilateral Near:     Physical Exam Vitals reviewed.  Constitutional:      General: She is awake. She is not in acute distress.    Appearance: Normal appearance. She is well-developed. She is not ill-appearing.     Comments: Very pleasant female appears stated age in no acute distress sitting comfortably in exam room  HENT:     Head: Normocephalic and atraumatic.  Cardiovascular:     Rate and Rhythm: Normal rate and regular rhythm.     Heart sounds: Normal heart sounds, S1 normal and S2 normal. No murmur heard. Pulmonary:     Effort: Pulmonary effort is normal.     Breath sounds: Normal breath sounds. No wheezing, rhonchi or rales.     Comments: Clear to auscultation bilaterally Chest:     Chest wall: Tenderness present. No deformity or swelling.     Comments: Tender palpation over anterior and lateral left chest  wall from midclavicular line to mid axillary line. Abdominal:     General: Bowel sounds are normal.     Palpations: Abdomen is soft.     Tenderness: There is no abdominal tenderness.  Musculoskeletal:     Cervical back: No tenderness or bony tenderness.     Thoracic back: No tenderness or bony tenderness.     Lumbar back: No tenderness or bony tenderness.  Skin:    Findings: No bruising or ecchymosis.  Neurological:     General: No focal deficit present.     Mental Status: She is alert and oriented to person, place, and time.     Cranial Nerves: Cranial nerves 2-12 are intact.     Motor: Motor function is intact.     Coordination: Coordination is intact.  Psychiatric:        Behavior: Behavior is cooperative.      UC Treatments / Results  Labs (all labs ordered are listed, but only abnormal results are displayed) Labs Reviewed - No data to display  EKG   Radiology DG Ribs Unilateral W/Chest Left  Result Date: 06/21/2022 CLINICAL DATA:  Fall, left chest pain EXAM: LEFT RIBS AND CHEST - 3+ VIEW COMPARISON:  None Available. FINDINGS: No fracture or other bone lesions are seen involving the ribs. There is no evidence of pneumothorax or pleural effusion. Both lungs are clear. Heart size and mediastinal contours are within normal limits. IMPRESSION: Negative. Electronically Signed   By: Helyn Numbers M.D.   On: 06/21/2022 17:20    Procedures Procedures (including critical care time)  Medications Ordered in UC Medications - No data to display  Initial Impression / Assessment and Plan / UC Course  I have reviewed the triage vital signs and the nursing notes.  Pertinent labs & imaging results that were available during my care of the patient were reviewed by me and considered in my medical decision making (see chart for details).     We discussed that given she is over 65 and had a recent head injury it is reasonable to get a CT, however, patient has no significant symptoms  and does not take anything medication.  Her fall occurred several days ago and she is not having any symptoms so we discussed that we will monitor her but if she develops any headache, dizziness, nausea, vomiting, confusion she needs to go to the ER to which she expressed understanding.  X-ray of chest wall was obtained that showed no acute osseous abnormality.  Suspect contusion as etiology of symptoms.  Patient is already prescribed Soma and is taking meloxicam so was encouraged to continue these medications.  Will add Tylenol as well as lidocaine patches to help manage pain.  Discussed that she should use heat and gentle stretch for symptom relief.  She asked whether or not it was safe to drive and discussed that if her symptoms are improving and she feels she has full range of motion of her neck and chest to look for cars in her blind spot it would be safe but if she is continuing to have any pain or stiffness I would recommend postponing her trip.  If she has any worsening or changing symptoms including increasing pain, shortness of breath, palpitations, cough, hemoptysis she needs to be seen immediately.  Strict return precautions given.  All questions answered to patient satisfaction.  Final Clinical Impressions(s) / UC Diagnoses   Final diagnoses:  Contusion of left chest wall, initial encounter  Chest wall pain  Fall, initial encounter     Discharge Instructions      Your x-ray was normal.  No evidence of fracture or broken bones.  I suspect that you have had soft tissue bruising caused by the fall causing your pain.  Continue your meloxicam daily.  You can use Tylenol/acetaminophen over-the-counter.  Take your Tresa Garter as needed.  You can use lidocaine patches during the day for 12 hours and then remove for 12 hours; use only 1 patch per 24 hours.  Use heat and gentle stretch to help manage the symptoms.  If at any point anything worsens and you have severe pain, shortness of breath, worsening  cough, coughing up blood, heart racing you need to be seen immediately.     ED Prescriptions     Medication Sig Dispense Auth. Provider   lidocaine (LIDODERM) 5 % Place 1 patch onto the skin daily. Remove & Discard patch within 12 hours or as directed by MD 30 patch Rigby Swamy K, PA-C      I have reviewed the PDMP during this encounter.   Jeani Hawking, PA-C 06/21/22 1744

## 2022-06-22 NOTE — Telephone Encounter (Signed)
LDVM on machine per DPR.  

## 2022-07-06 NOTE — Progress Notes (Deleted)
72 y.o. G1P1 Married Caucasian female here for annual exam.    PCP:     Patient's last menstrual period was 09/24/2005.           Sexually active: {yes no:314532}  The current method of family planning is post menopausal status.    Exercising: {yes no:314532}  {types:19826} Smoker:  no  Health Maintenance: Pap:  11/15/17 neg: HR HPV neg History of abnormal Pap:  no MMG:  05/18/22 Breast Density Cat C, BI-RADS CAT 2 benign Colonoscopy:  01/25/04 BMD:   12/10/20  Result  osteopenia TDaP:  06/05/15 Gardasil:   no HIV: 06/04/12 NR Hep C: PCP Screening Labs:  Hb today: ***, Urine today: ***   reports that she has never smoked. She has never used smokeless tobacco. She reports current alcohol use of about 14.0 standard drinks of alcohol per week. She reports that she does not use drugs.  Past Medical History:  Diagnosis Date   Back injury 1994   back/neck injury    Cancer (HCC)    Breast cancer--right   Endometriosis    Family history of breast cancer    Family history of kidney cancer    Family history of lung cancer    Lyme disease, unspecified    followed by PCP   Osteoporosis    Pinched nerve in neck 12/2013   Postmenopausal bleeding 2016   uterine fibroid, on HRT   STD (sexually transmitted disease)    HSV   Urinary incontinence     Past Surgical History:  Procedure Laterality Date   BREAST SURGERY  07-23-08   benign Rt.breast biopsy--benign stromal fibrosis with calcifications:[facility]   CATARACT EXTRACTION, BILATERAL Bilateral 11/2016, 03/2017   EYE SURGERY Right    cyst removed x2    LASIK     OOPHORECTOMY  12/86   PELVIC LAPAROSCOPY  1986   endometriosis   TOTAL MASTECTOMY Right 08/03/2020   Procedure: RIGHT TOTAL MASTECTOMY;  Surgeon: Abigail Miyamoto, MD;  Location: [facility];  Service: General;  Laterality: Right;    Current Outpatient Medications  Medication Sig Dispense Refill   Ascorbic Acid (VITAMIN C PO) Take 500 mg by mouth daily.       Calcium Carb-Cholecalciferol (CALCIUM + D3) 600-200 MG-UNIT TABS 1 tablet     carisoprodol (SOMA) 350 MG tablet 1 tablet as needed     Cholecalciferol (D3) 50 MCG (2000 UT) TABS      lidocaine (LIDODERM) 5 % Place 1 patch onto the skin daily. Remove & Discard patch within 12 hours or as directed by MD 30 patch 0   meloxicam (MOBIC) 15 MG tablet Take 1 tablet by mouth daily.     Multiple Vitamin (MULTI-VITAMIN PO) Take by mouth.     NONFORMULARY OR COMPOUNDED ITEM Vitamin E 200 u/ml vaginal suppositories.  Place one per vaginal at hs twice weekly. 8 each 0   raloxifene (EVISTA) 60 MG tablet Take 1 tablet (60 mg total) by mouth daily. 90 tablet 3   selenium sulfide (SELSUN) 2.5 % shampoo      tretinoin microspheres (RETIN-A MICRO) 0.1 % gel Apply 1 application topically at bedtime.      valACYclovir (VALTREX) 1000 MG tablet TAKE 1 TABLET BY MOUTH AT BEDTIME. TAKE 1 TABLET TWICE DAILY X 3 DAYS FOR GENITAL OUTBREAK AND 2 TABS EVERY 12 HOURS X 1 DAY FOR ORAL OUTBREAK 110 tablet 0   VITAMIN E EX Apply topically. suppository     No current facility-administered medications  for this visit.    Family History  Problem Relation Age of Onset   Hypertension Mother    COPD Mother    Hyperlipidemia Mother    Kidney cancer Father 68       dx late 44s   Breast cancer Sister 29   Pancreatic cancer Maternal Aunt        dx 58s (DDT exposure)   Cancer Maternal Aunt        gastrointestinal dx 22s (DDT exposure)   Prostate cancer Maternal Uncle        (DDT exposure)   Osteoporosis Maternal Grandfather    Osteoporosis Paternal Grandmother    Breast cancer Paternal Grandmother 21       mastectomy   Lung cancer Paternal Grandfather        dx late 11s, smoker    Review of Systems  Exam:   LMP 09/24/2005     General appearance: alert, cooperative and appears stated age Head: normocephalic, without obvious abnormality, atraumatic Neck: no adenopathy, supple, symmetrical, trachea midline and thyroid  normal to inspection and palpation Lungs: clear to auscultation bilaterally Breasts: normal appearance, no masses or tenderness, No nipple retraction or dimpling, No nipple discharge or bleeding, No axillary adenopathy Heart: regular rate and rhythm Abdomen: soft, non-tender; no masses, no organomegaly Extremities: extremities normal, atraumatic, no cyanosis or edema Skin: skin color, texture, turgor normal. No rashes or lesions Lymph nodes: cervical, supraclavicular, and axillary nodes normal. Neurologic: grossly normal  Pelvic: External genitalia:  no lesions              No abnormal inguinal nodes palpated.              Urethra:  normal appearing urethra with no masses, tenderness or lesions              Bartholins and Skenes: normal                 Vagina: normal appearing vagina with normal color and discharge, no lesions              Cervix: no lesions              Pap taken: {yes no:314532} Bimanual Exam:  Uterus:  normal size, contour, position, consistency, mobility, non-tender              Adnexa: no mass, fullness, tenderness              Rectal exam: {yes no:314532}.  Confirms.              Anus:  normal sphincter tone, no lesions  Chaperone was present for exam:  ***  Assessment:   Well woman visit with gynecologic exam.   Plan: Mammogram screening discussed. Self breast awareness reviewed. Pap and HR HPV as above. Guidelines for Calcium, Vitamin D, regular exercise program including cardiovascular and weight bearing exercise.   Follow up annually and prn.   Additional counseling given.  {yes [id number]. _______ minutes face to face time of which over 50% was spent in counseling.    After visit summary provided.

## 2022-07-07 DIAGNOSIS — R202 Paresthesia of skin: Secondary | ICD-10-CM | POA: Diagnosis not present

## 2022-07-07 DIAGNOSIS — Z0000 Encounter for general adult medical examination without abnormal findings: Secondary | ICD-10-CM | POA: Diagnosis not present

## 2022-07-07 DIAGNOSIS — M8589 Other specified disorders of bone density and structure, multiple sites: Secondary | ICD-10-CM | POA: Diagnosis not present

## 2022-07-07 DIAGNOSIS — E785 Hyperlipidemia, unspecified: Secondary | ICD-10-CM | POA: Diagnosis not present

## 2022-07-07 DIAGNOSIS — T50905A Adverse effect of unspecified drugs, medicaments and biological substances, initial encounter: Secondary | ICD-10-CM | POA: Diagnosis not present

## 2022-07-07 DIAGNOSIS — M7061 Trochanteric bursitis, right hip: Secondary | ICD-10-CM | POA: Diagnosis not present

## 2022-07-07 DIAGNOSIS — Z79899 Other long term (current) drug therapy: Secondary | ICD-10-CM | POA: Diagnosis not present

## 2022-07-07 DIAGNOSIS — N3281 Overactive bladder: Secondary | ICD-10-CM | POA: Diagnosis not present

## 2022-07-07 DIAGNOSIS — M1611 Unilateral primary osteoarthritis, right hip: Secondary | ICD-10-CM | POA: Diagnosis not present

## 2022-07-11 ENCOUNTER — Ambulatory Visit: Payer: BC Managed Care – PPO | Admitting: Obstetrics and Gynecology

## 2022-07-20 ENCOUNTER — Ambulatory Visit: Payer: BC Managed Care – PPO | Admitting: Obstetrics and Gynecology

## 2022-07-20 ENCOUNTER — Other Ambulatory Visit: Payer: Self-pay

## 2022-07-20 NOTE — Telephone Encounter (Signed)
You can call in the vit E script. Thank you.

## 2022-07-20 NOTE — Telephone Encounter (Signed)
Vitamin e 200 u/ml vaginal suppositories, place 1 into vagina at hs twice weekly #8 with 2 refills [facility]. Patient is aware.

## 2022-07-20 NOTE — Telephone Encounter (Signed)
Patient called & left message on triage voicemail. Patient states her appt with Dr Edward Jolly for her aex had to be cancelled & next available would be 8/24. She needs refill of vitamin e to [facility] for [facility]. She thought she may need a refill of valtrex but I checked with her pharmacy & the refill she just picked up will last her until 8/24 & she also has a rx that was sent 3/24 that she can get after that.  Last AEX:  06-03-21 Next AEX: pt told to go ahead & schedule it Last MMG (if hormonal medication request): 05-18-22 Refill authorized: Please approve rx for vitamin e if appropriate.  Routing to Dr Oscar La, covering for Dr Edward Jolly.

## 2022-07-26 DIAGNOSIS — M7061 Trochanteric bursitis, right hip: Secondary | ICD-10-CM | POA: Diagnosis not present

## 2022-07-27 ENCOUNTER — Ambulatory Visit: Payer: Medicare PPO | Admitting: Family

## 2022-07-27 ENCOUNTER — Telehealth: Payer: Self-pay | Admitting: Family

## 2022-07-27 ENCOUNTER — Other Ambulatory Visit (INDEPENDENT_AMBULATORY_CARE_PROVIDER_SITE_OTHER): Payer: Medicare PPO

## 2022-07-27 DIAGNOSIS — M79671 Pain in right foot: Secondary | ICD-10-CM

## 2022-07-27 DIAGNOSIS — M7741 Metatarsalgia, right foot: Secondary | ICD-10-CM

## 2022-07-27 NOTE — Telephone Encounter (Signed)
Patient would like a Rx for her Orthotics for insoles for her shoes can we fax it over to [facility] on [address] please advise call after complete or message me and ill call patient

## 2022-07-29 ENCOUNTER — Encounter: Payer: Self-pay | Admitting: Family

## 2022-07-29 NOTE — Progress Notes (Signed)
Office Visit Note   Patient: Maureen Figueroa           Date of Birth: 1950-06-21           MRN: [id number] Visit Date: 07/27/2022              Requested by: Thana Ates, MD [address] [place],  [place] [address] PCP: Thana Ates, MD  Chief Complaint  Patient presents with   Right Foot - Pain      HPI: The patient is a 72 year old woman seen today for initial evaluation of right foot pain and numbness this is primarily in the forefoot in the area of her metatarsal heads.  She has a custom orthotic which she has gotten from [employer] which did initially provide some relief of her pain she is concerned that she has developed a bunion with some outward movement of her great toe she has been using his toe separator as well  Complains of increased walking and prolonged standing increase her pain it feels that she has a bruise or a bump beneath the second third metatarsal head area occasionally develops numbness and tingling in both feet in the same area  Has used Mobic without relief  Assessment & Plan: Visit Diagnoses:  1. Right foot pain     Plan: Discussed supportive shoewear heel cord stretching for her heel cord tightness and arch supports to offload the metatarsal heads.  Discussed her elongated second metatarsal  Follow-Up Instructions: No follow-ups on file.   Ortho Exam  Patient is alert, oriented, no adenopathy, well-dressed, normal affect, normal respiratory effort. On examination bilateral feet the feet are plantigrade without erythema she does have superficial callus buildup beneath the second metatarsal head this area is tender to palpation there is no corn she has heel cord tightness with dorsiflexion just shy of neutral.  Imaging: No results found. No images are attached to the encounter.  Labs: Lab Results  Component Value Date   HGBA1C 5.4 01/01/2014   ESRSEDRATE 1 12/31/2013     Lab Results  Component Value Date   ALBUMIN 4.4  02/24/2022   ALBUMIN 4.5 10/20/2021   ALBUMIN 4.6 08/24/2021    No results found for: "MG" Lab Results  Component Value Date   VD25OH 56 06/21/2013   VD25OH 43 06/04/2012    No results found for: "PREALBUMIN"    Latest Ref Rng & Units 02/24/2022    1:56 PM 08/24/2021   10:21 AM 02/24/2021    2:24 PM  CBC EXTENDED  WBC 4.0 - 10.5 K/uL 5.1  5.1  5.6   RBC 3.87 - 5.11 MIL/uL 4.20  4.38  4.08   Hemoglobin 12.0 - 15.0 g/dL 19.1  47.8  29.5   HCT 36.0 - 46.0 % 41.4  43.4  40.3   Platelets 150 - 400 K/uL 230  238  240   NEUT# 1.7 - 7.7 K/uL 2.9  3.0  3.1   Lymph# 0.7 - 4.0 K/uL 1.8  1.5  1.8      There is no height or weight on file to calculate BMI.  Orders:  Orders Placed This Encounter  Procedures   XR Foot 2 Views Right   No orders of the defined types were placed in this encounter.    Procedures: No procedures performed  Clinical Data: No additional findings.  ROS:  All other systems negative, except as noted in the HPI. Review of Systems  Objective: Vital Signs: LMP  09/24/2005   Specialty Comments:  No specialty comments available.  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   CAVUS DEFORMITY OF FOOT, ACQUIRED 12/25/2008   Past Medical History:  Diagnosis Date   Back injury 1994   back/neck injury    Cancer (HCC)    Breast cancer--right   Endometriosis    Family history of breast cancer    Family history of kidney cancer    Family history of lung cancer    Lyme disease, unspecified     followed by PCP   Osteoporosis    Pinched nerve in neck 12/2013   Postmenopausal bleeding 2016   uterine fibroid, on HRT   STD (sexually transmitted disease)    HSV   Urinary incontinence     Family History  Problem Relation Age of Onset   Hypertension Mother    COPD Mother    Hyperlipidemia Mother    Kidney cancer Father 87       dx late 37s   Breast cancer Sister 20   Pancreatic cancer Maternal Aunt        dx 20s (DDT exposure)   Cancer Maternal Aunt        gastrointestinal dx 23s (DDT exposure)   Prostate cancer Maternal Uncle        (DDT exposure)   Osteoporosis Maternal Grandfather    Osteoporosis Paternal Grandmother    Breast cancer Paternal Grandmother 51       mastectomy   Lung cancer Paternal Grandfather        dx late 29s, smoker    Past Surgical History:  Procedure Laterality Date   BREAST SURGERY  07-23-08   benign Rt.breast biopsy--benign stromal fibrosis with calcifications:[facility]   CATARACT EXTRACTION, BILATERAL Bilateral 11/2016, 03/2017   EYE SURGERY Right    cyst removed x2    LASIK     OOPHORECTOMY  12/86   PELVIC LAPAROSCOPY  1986   endometriosis   TOTAL MASTECTOMY Right 08/03/2020   Procedure: RIGHT TOTAL MASTECTOMY;  Surgeon: Abigail Miyamoto, MD;  Location:  SUR[facility]ERY CENTER;  Service: General;  Laterality: Right;   Social History   Occupational History   Occupation: Runner, broadcasting/film/video   Tobacco Use   Smoking status: Never   Smokeless tobacco: Never  Vaping Use   Vaping Use: Never used  Substance and Sexual Activity   Alcohol use: Yes    Alcohol/week: 14.0 standard drinks of alcohol    Types: 14 Standard drinks or equivalent per week    Comment: daily wine or alcohol since 2020   Drug use: No   Sexual activity: Yes    Partners: Male    Birth control/protection: Post-menopausal    Comment: HSV 2+

## 2022-08-03 NOTE — Telephone Encounter (Signed)
Aex is 11-14-22 with Dr Edward Jolly

## 2022-08-13 ENCOUNTER — Encounter: Payer: Self-pay | Admitting: Nurse Practitioner

## 2022-08-15 NOTE — Progress Notes (Signed)
72 y.o. G1P1 Married Caucasian female here for annual exam.    Wants her urine checked due to recent UTI, 1.5 months ago. No current symptoms.   Some urinary incontinence.  Can leak with cough and some urgency.  Using pads.  Has nausea and vomiting with taking her MVI.   Doing PT for her hip at [facility].  Working 1/2 time at [employer].   PCP:   Dr. Margaretann Loveless  Patient's last menstrual period was 09/24/2005.           Sexually active: Yes.    The current method of family planning is post menopausal status.    Exercising: Yes.     PT Smoker:  no  Health Maintenance: Pap:  11/15/17 neg: HR HPV neg, 10-03-14 Neg:Neg HR HPV,  06-04-12 Neg:Neg HR HPV  History of abnormal Pap:  no MMG:  05/18/22 Breast Density Cat C, BI-RADS CAT 2 benign.  Will do breast MRI in October.  Colonoscopy:  01/25/04 BMD:   12/10/20  Result  osteopenia TDaP:  06/05/15 Gardasil:   no HIV: 11/15/17 neg Hep C: PCP Screening Labs:  PCP   reports that she has never smoked. She has never used smokeless tobacco. She reports current alcohol use of about 14.0 standard drinks of alcohol per week. She reports that she does not use drugs.  Past Medical History:  Diagnosis Date   Back injury 1994   back/neck injury    Cancer (HCC)    Breast cancer--right   Endometriosis    Family history of breast cancer    Family history of kidney cancer    Family history of lung cancer    Lyme disease, unspecified    followed by PCP   Osteoporosis    Pinched nerve in neck 12/2013   Postmenopausal bleeding 2016   uterine fibroid, on HRT   STD (sexually transmitted disease)    HSV   Urinary incontinence     Past Surgical History:  Procedure Laterality Date   BREAST SURGERY  07-23-08   benign Rt.breast biopsy--benign stromal fibrosis with calcifications:[facility]   CATARACT EXTRACTION, BILATERAL Bilateral 11/2016, 03/2017   EYE SURGERY Right    cyst removed x2    LASIK     OOPHORECTOMY  12/86   PELVIC LAPAROSCOPY  1986    endometriosis   TOTAL MASTECTOMY Right 08/03/2020   Procedure: RIGHT TOTAL MASTECTOMY;  Surgeon: Abigail Miyamoto, MD;  Location: [facility];  Service: General;  Laterality: Right;    Current Outpatient Medications  Medication Sig Dispense Refill   Ascorbic Acid (VITAMIN C PO) Take 500 mg by mouth daily.      Calcium Carb-Cholecalciferol (CALCIUM + D3) 600-200 MG-UNIT TABS 1 tablet     carisoprodol (SOMA) 350 MG tablet 1 tablet as needed     Cholecalciferol (D3) 50 MCG (2000 UT) TABS      lidocaine (LIDODERM) 5 % Place 1 patch onto the skin daily. Remove & Discard patch within 12 hours or as directed by MD 30 patch 0   meloxicam (MOBIC) 15 MG tablet Take 1 tablet by mouth daily.     Multiple Vitamin (MULTI-VITAMIN PO) Take by mouth.     raloxifene (EVISTA) 60 MG tablet Take 1 tablet (60 mg total) by mouth daily. 90 tablet 3   selenium sulfide (SELSUN) 2.5 % shampoo      terbinafine (LAMISIL) 250 MG tablet Take 1 tablet by mouth daily.     tretinoin microspheres (RETIN-A MICRO) 0.1 % gel  Apply 1 application topically at bedtime.      VITAMIN E EX Apply topically. suppository     NONFORMULARY OR COMPOUNDED ITEM Vitamin E 200 u/ml vaginal suppositories.  Place one per vaginal at hs twice weekly. 24 each 3   valACYclovir (VALTREX) 1000 MG tablet TAKE 1 TABLET BY MOUTH AT BEDTIME. TAKE 1 TABLET TWICE DAILY X 3 DAYS FOR GENITAL OUTBREAK AND 2 TABS EVERY 12 HOURS X 1 DAY FOR ORAL OUTBREAK 110 tablet 3   No current facility-administered medications for this visit.    Family History  Problem Relation Age of Onset   Hypertension Mother    COPD Mother    Hyperlipidemia Mother    Kidney cancer Father 51       dx late 57s   Breast cancer Sister 21   Pancreatic cancer Maternal Aunt        dx 86s (DDT exposure)   Cancer Maternal Aunt        gastrointestinal dx 52s (DDT exposure)   Prostate cancer Maternal Uncle        (DDT exposure)   Osteoporosis Maternal Grandfather     Osteoporosis Paternal Grandmother    Breast cancer Paternal Grandmother 3       mastectomy   Lung cancer Paternal Grandfather        dx late 55s, smoker    Review of Systems  All other systems reviewed and are negative.   Exam:   BP 130/84 (BP Location: Right Arm, Patient Position: Sitting, Cuff Size: Normal)   Pulse 68   Ht 5' 5.5" (1.664 m)   Wt 156 lb (70.8 kg)   LMP 09/24/2005   SpO2 98%   BMI 25.56 kg/m     General appearance: alert, cooperative and appears stated age Head: normocephalic, without obvious abnormality, atraumatic Neck: no adenopathy, supple, symmetrical, trachea midline and thyroid normal to inspection and palpation Lungs: clear to auscultation bilaterally Breasts: right breast absent.  Left breast normal appearance, no masses or tenderness, No nipple retraction or dimpling, No nipple discharge or bleeding, No axillary adenopathy Heart: regular rate and rhythm Abdomen: soft, non-tender; no masses, no organomegaly Extremities: extremities normal, atraumatic, no cyanosis or edema Skin: skin color, texture, turgor normal. No rashes or lesions Lymph nodes: cervical, supraclavicular, and axillary nodes normal. Neurologic: grossly normal  Pelvic: External genitalia:  no lesions              No abnormal inguinal nodes palpated.              Urethra:  normal appearing urethra with no masses, tenderness or lesions              Bartholins and Skenes: normal                 Vagina: normal appearing vagina with normal color and discharge, no lesions              Cervix: no lesions              Pap taken: yes Bimanual Exam:  Uterus:  normal size, contour, position, consistency, mobility, non-tender              Adnexa: no mass, fullness, tenderness              Rectal exam: yes.  Confirms.              Anus:  normal sphincter tone, no lesions  Chaperone was present for exam:  Warren Lacy,  CMA  Assessment:   GYN exam for high risk Medicare patient. Vaginal  atrophy. Recent UTI. Status post oophorectomy.  Mixed incontinence.  Hx HSV II.   Osteopenia. PCP following. On [facility].  Personal and FH breast cancer. MGM and sister.  Patient is status post right mastectomy.  No radiation and no chemotherapy.  Plan: Mammogram screening discussed. Self breast awareness reviewed. Pap and HR HPV collected. Urinalysis and reflex culture.  Guidelines for Calcium, Vitamin D, regular exercise program including cardiovascular and weight bearing exercise. She will consider pelvic floor therapy at [facility] and let me know.  Refill of Valtrex.  Refill of Vit E vaginal suppository.  Bone density this fall.  Follow up annually and prn.   35 min  total time was spent for this patient encounter, including preparation, face-to-face counseling with the patient, coordination of care, and documentation of the encounter in addition to doing breast and pelvic and pap.

## 2022-08-29 ENCOUNTER — Ambulatory Visit: Payer: Medicare PPO | Admitting: Obstetrics and Gynecology

## 2022-08-29 ENCOUNTER — Other Ambulatory Visit (HOSPITAL_COMMUNITY)
Admission: RE | Admit: 2022-08-29 | Discharge: 2022-08-29 | Disposition: A | Discharge status: Home / Self Care | Payer: Medicare PPO | Source: Ambulatory Visit | Attending: Obstetrics and Gynecology | Admitting: Obstetrics and Gynecology

## 2022-08-29 ENCOUNTER — Encounter: Payer: Self-pay | Admitting: Obstetrics and Gynecology

## 2022-08-29 VITALS — BP 130/84 | HR 68 | Ht 65.5 in | Wt 156.0 lb

## 2022-08-29 DIAGNOSIS — Z5181 Encounter for therapeutic drug level monitoring: Secondary | ICD-10-CM

## 2022-08-29 DIAGNOSIS — Z01419 Encounter for gynecological examination (general) (routine) without abnormal findings: Secondary | ICD-10-CM | POA: Insufficient documentation

## 2022-08-29 DIAGNOSIS — Z1151 Encounter for screening for human papillomavirus (HPV): Secondary | ICD-10-CM | POA: Insufficient documentation

## 2022-08-29 DIAGNOSIS — Z9189 Other specified personal risk factors, not elsewhere classified: Secondary | ICD-10-CM | POA: Diagnosis not present

## 2022-08-29 DIAGNOSIS — B009 Herpesviral infection, unspecified: Secondary | ICD-10-CM | POA: Diagnosis not present

## 2022-08-29 DIAGNOSIS — Z124 Encounter for screening for malignant neoplasm of cervix: Secondary | ICD-10-CM

## 2022-08-29 DIAGNOSIS — M8580 Other specified disorders of bone density and structure, unspecified site: Secondary | ICD-10-CM

## 2022-08-29 DIAGNOSIS — Z853 Personal history of malignant neoplasm of breast: Secondary | ICD-10-CM | POA: Diagnosis not present

## 2022-08-29 DIAGNOSIS — N390 Urinary tract infection, site not specified: Secondary | ICD-10-CM

## 2022-08-29 LAB — URINALYSIS, COMPLETE W/RFL CULTURE
Bacteria, UA: NONE SEEN /HPF
Bilirubin Urine: NEGATIVE
Glucose, UA: NEGATIVE
Hgb urine dipstick: NEGATIVE
Hyaline Cast: NONE SEEN /LPF
Ketones, ur: NEGATIVE
Leukocyte Esterase: NEGATIVE
Nitrites, Initial: NEGATIVE
Protein, ur: NEGATIVE
RBC / HPF: NONE SEEN /HPF (ref 0–2)
Specific Gravity, Urine: 1.02 (ref 1.001–1.035)
WBC, UA: NONE SEEN /HPF (ref 0–5)
pH: 7 (ref 5.0–8.0)

## 2022-08-29 LAB — NO CULTURE INDICATED

## 2022-08-29 MED ORDER — NONFORMULARY OR COMPOUNDED ITEM: 3 refills | Status: DC

## 2022-08-29 MED ORDER — VALACYCLOVIR HCL 1 G PO TABS: ORAL_TABLET | ORAL | 3 refills | Status: DC

## 2022-08-29 NOTE — Patient Instructions (Signed)

## 2022-09-01 ENCOUNTER — Encounter: Payer: Self-pay | Admitting: Nurse Practitioner

## 2022-09-06 DIAGNOSIS — M7061 Trochanteric bursitis, right hip: Secondary | ICD-10-CM | POA: Diagnosis not present

## 2022-09-07 ENCOUNTER — Encounter: Payer: Self-pay | Admitting: Family

## 2022-09-07 ENCOUNTER — Ambulatory Visit: Payer: Medicare PPO | Admitting: Family

## 2022-09-07 DIAGNOSIS — M7741 Metatarsalgia, right foot: Secondary | ICD-10-CM

## 2022-09-07 NOTE — Progress Notes (Signed)
Office Visit Note   Patient: Maureen Figueroa           Date of Birth: 04-19-1950           MRN: [id number] Visit Date: 09/07/2022              Requested by: Thana Ates, MD [address] [place],  [place] [address] PCP: Thana Ates, MD  Chief Complaint  Patient presents with   Right Foot - Follow-up      HPI: The patient is a 72 year old woman who presents today in follow-up for right foot pain.  She describes the pain as moderate to severe she also has some associated numbness this is brought on by weightbearing  She has recently had new modifications to her orthotics which have provided some relief.  However she does 4 she would like to eventually consider osteotomy of the second metatarsal has questions about the surgery today.  Assessment & Plan: Visit Diagnoses: No diagnosis found.  Plan: All questions encouraged and answered.  She reports that she would like to consider surgery in November or December of the coming year.  She will follow-up with Dr. Lajoyce Corners as needed for this.  Encouraged her to continue with her supportive orthotics which she has had modified by [facility]  Follow-Up Instructions: Return if symptoms worsen or fail to improve.   Ortho Exam  Patient is alert, oriented, no adenopathy, well-dressed, normal affect, normal respiratory effort. On examination of the right foot she has superficial callus buildup beneath the second metatarsal head this is moderately tender there is no corn or open ulcer.  She does have heel cord tightness as well with dorsiflexion just shy of neutral.  There is no erythema or edema.  Imaging: No results found. No images are attached to the encounter.  Labs: Lab Results  Component Value Date   HGBA1C 5.4 01/01/2014   ESRSEDRATE 1 12/31/2013     Lab Results  Component Value Date   ALBUMIN 4.4 02/24/2022   ALBUMIN 4.5 10/20/2021   ALBUMIN 4.6 08/24/2021    No results found for: "MG" Lab Results   Component Value Date   VD25OH 56 06/21/2013   VD25OH 43 06/04/2012    No results found for: "PREALBUMIN"    Latest Ref Rng & Units 02/24/2022    1:56 PM 08/24/2021   10:21 AM 02/24/2021    2:24 PM  CBC EXTENDED  WBC 4.0 - 10.5 K/uL 5.1  5.1  5.6   RBC 3.87 - 5.11 MIL/uL 4.20  4.38  4.08   Hemoglobin 12.0 - 15.0 g/dL 19.1  47.8  29.5   HCT 36.0 - 46.0 % 41.4  43.4  40.3   Platelets 150 - 400 K/uL 230  238  240   NEUT# 1.7 - 7.7 K/uL 2.9  3.0  3.1   Lymph# 0.7 - 4.0 K/uL 1.8  1.5  1.8      There is no height or weight on file to calculate BMI.  Orders:  No orders of the defined types were placed in this encounter.  No orders of the defined types were placed in this encounter.    Procedures: No procedures performed  Clinical Data: No additional findings.  ROS:  All other systems negative, except as noted in the HPI. Review of Systems  Objective: Vital Signs: LMP 09/24/2005   Specialty Comments:  No specialty comments available.  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   Overactive bladder 11/15/2017   Dizziness 12/31/2013   Numbness 12/31/2013   Hypokalemia 12/31/2013   Numbness and tingling 12/31/2013   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   CAVUS DEFORMITY OF FOOT, ACQUIRED 12/25/2008   Past Medical History:  Diagnosis Date   Back injury 1994   back/neck injury    Cancer (HCC)    Breast cancer--right   Endometriosis    Family history of breast cancer    Family history of kidney cancer    Family history of lung cancer    Lyme disease, unspecified    followed by PCP   Osteoporosis    Pinched nerve in neck 12/2013   Postmenopausal bleeding 2016   uterine  fibroid, on HRT   STD (sexually transmitted disease)    HSV   Urinary incontinence     Family History  Problem Relation Age of Onset   Hypertension Mother    COPD Mother    Hyperlipidemia Mother    Kidney cancer Father 37       dx late 66s   Breast cancer Sister 19   Pancreatic cancer Maternal Aunt        dx 26s (DDT exposure)   Cancer Maternal Aunt        gastrointestinal dx 67s (DDT exposure)   Prostate cancer Maternal Uncle        (DDT exposure)   Osteoporosis Maternal Grandfather    Osteoporosis Paternal Grandmother    Breast cancer Paternal Grandmother 42       mastectomy   Lung cancer Paternal Grandfather        dx late 53s, smoker    Past Surgical History:  Procedure Laterality Date   BREAST SURGERY  07-23-08   benign Rt.breast biopsy--benign stromal fibrosis with calcifications:[facility]   CATARACT EXTRACTION, BILATERAL Bilateral 11/2016, 03/2017   EYE SURGERY Right    cyst removed x2    LASIK     OOPHORECTOMY  12/86   PELVIC LAPAROSCOPY  1986   endometriosis   TOTAL MASTECTOMY Right 08/03/2020   Procedure: RIGHT TOTAL MASTECTOMY;  Surgeon: Abigail Miyamoto, MD;  Location: [facility];  Service: General;  Laterality: Right;   Social History   Occupational History   Occupation: Runner, broadcasting/film/video   Tobacco Use   Smoking status: Never   Smokeless tobacco: Never  Vaping Use   Vaping status: Never Used  Substance and Sexual Activity   Alcohol use: Yes    Alcohol/week: 14.0 standard drinks of alcohol    Types: 14 Standard drinks or equivalent per week    Comment: daily wine or alcohol since 2020   Drug use: No   Sexual activity: Yes    Partners: Male    Birth control/protection: Post-menopausal    Comment: HSV 2+

## 2022-10-02 ENCOUNTER — Other Ambulatory Visit: Payer: Self-pay | Admitting: Nurse Practitioner

## 2022-10-07 DIAGNOSIS — E785 Hyperlipidemia, unspecified: Secondary | ICD-10-CM | POA: Diagnosis not present

## 2022-10-07 DIAGNOSIS — Z79899 Other long term (current) drug therapy: Secondary | ICD-10-CM | POA: Diagnosis not present

## 2022-10-07 DIAGNOSIS — M8589 Other specified disorders of bone density and structure, multiple sites: Secondary | ICD-10-CM | POA: Diagnosis not present

## 2022-10-11 ENCOUNTER — Encounter: Payer: Self-pay | Admitting: Plastic Surgery

## 2022-10-11 ENCOUNTER — Ambulatory Visit: Payer: Medicare PPO | Admitting: Plastic Surgery

## 2022-10-11 VITALS — BP 137/79 | HR 77 | Ht 65.0 in | Wt 160.0 lb

## 2022-10-11 DIAGNOSIS — L989 Disorder of the skin and subcutaneous tissue, unspecified: Secondary | ICD-10-CM

## 2022-10-11 DIAGNOSIS — M7061 Trochanteric bursitis, right hip: Secondary | ICD-10-CM | POA: Diagnosis not present

## 2022-10-11 NOTE — Progress Notes (Signed)
Patient ID: Maureen Figueroa, female    DOB: 04/12/1950, 72 y.o.   MRN: [id number]   Chief Complaint  Patient presents with   Consult    The patient is a 72 year old female here for evaluation of a skin lesion on her abdomen.  About 5 years ago she had a lesion there that was removed by the dermatologist.  She described it as a blackhead.  Now I cannot quite tell if it is hypertrophic scar or if there is something more going on.  It is flesh-colored and firm.  It is in the midline of the upper middle area.  It is about 1.5 cm in size.  It is not movable.    Review of Systems  Constitutional: Negative.   HENT: Negative.    Eyes: Negative.   Respiratory: Negative.    Cardiovascular: Negative.   Gastrointestinal: Negative.   Endocrine: Negative.   Genitourinary: Negative.   Musculoskeletal: Negative.     Past Medical History:  Diagnosis Date   Back injury 1994   back/neck injury    Cancer (HCC)    Breast cancer--right   Endometriosis    Family history of breast cancer    Family history of kidney cancer    Family history of lung cancer    Lyme disease, unspecified    followed by PCP   Osteoporosis    Pinched nerve in neck 12/2013   Postmenopausal bleeding 2016   uterine fibroid, on HRT   STD (sexually transmitted disease)    HSV   Urinary incontinence     Past Surgical History:  Procedure Laterality Date   BREAST SURGERY  07-23-08   benign Rt.breast biopsy--benign stromal fibrosis with calcifications:[facility]   CATARACT EXTRACTION, BILATERAL Bilateral 11/2016, 03/2017   EYE SURGERY Right    cyst removed x2    LASIK     OOPHORECTOMY  12/86   PELVIC LAPAROSCOPY  1986   endometriosis   TOTAL MASTECTOMY Right 08/03/2020   Procedure: RIGHT TOTAL MASTECTOMY;  Surgeon: Abigail Miyamoto, MD;  Location: [facility];  Service: General;  Laterality: Right;      Current Outpatient Medications:    Ascorbic Acid (VITAMIN C PO), Take 500 mg by mouth daily. ,  Disp: , Rfl:    Calcium Carb-Cholecalciferol (CALCIUM + D3) 600-200 MG-UNIT TABS, 1 tablet, Disp: , Rfl:    carisoprodol (SOMA) 350 MG tablet, 1 tablet as needed, Disp: , Rfl:    Cholecalciferol (D3) 50 MCG (2000 UT) TABS, , Disp: , Rfl:    meloxicam (MOBIC) 15 MG tablet, Take 1 tablet by mouth daily., Disp: , Rfl:    Multiple Vitamin (MULTI-VITAMIN PO), Take by mouth., Disp: , Rfl:    NONFORMULARY OR COMPOUNDED ITEM, Vitamin E 200 u/ml vaginal suppositories.  Place one per vaginal at hs twice weekly., Disp: 24 each, Rfl: 3   raloxifene (EVISTA) 60 MG tablet, TAKE 1 TABLET(60 MG) BY MOUTH DAILY, Disp: 90 tablet, Rfl: 3   selenium sulfide (SELSUN) 2.5 % shampoo, , Disp: , Rfl:    terbinafine (LAMISIL) 250 MG tablet, Take 1 tablet by mouth daily., Disp: , Rfl:    tretinoin microspheres (RETIN-A MICRO) 0.1 % gel, Apply 1 application topically at bedtime. , Disp: , Rfl:    valACYclovir (VALTREX) 1000 MG tablet, TAKE 1 TABLET BY MOUTH AT BEDTIME. TAKE 1 TABLET TWICE DAILY X 3 DAYS FOR GENITAL OUTBREAK AND 2 TABS EVERY 12 HOURS X 1 DAY FOR ORAL OUTBREAK, Disp: 110  tablet, Rfl: 3   VITAMIN E EX, Apply topically. suppository, Disp: , Rfl:    lidocaine (LIDODERM) 5 %, Place 1 patch onto the skin daily. Remove & Discard patch within 12 hours or as directed by MD, Disp: 30 patch, Rfl: 0   Objective:   Vitals:   10/11/22 1302  BP: 137/79  Pulse: 77  SpO2: 96%    Physical Exam Constitutional:      Appearance: Normal appearance.  Cardiovascular:     Rate and Rhythm: Normal rate.     Pulses: Normal pulses.  Pulmonary:     Effort: Pulmonary effort is normal.  Abdominal:     General: There is no distension.     Palpations: Abdomen is soft.     Tenderness: There is no abdominal tenderness.  Skin:    General: Skin is warm.     Capillary Refill: Capillary refill takes less than 2 seconds.     Coloration: Skin is not jaundiced.     Findings: Lesion present. No bruising.  Neurological:     Mental  Status: She is alert and oriented to person, place, and time.  Psychiatric:        Mood and Affect: Mood normal.        Behavior: Behavior normal.        Thought Content: Thought content normal.        Judgment: Judgment normal.     Assessment & Plan:  Changing skin lesion  The patient would like to go ahead and have it excised and see if we can start over.  We may need to do some Kenalog injection if this is a hypertrophic scar to try and prevent this from happening again.  We can do this in the office.  Pictures were obtained of the patient and placed in the chart with the patient's or guardian's permission.   Alena Bills Cornelio Parkerson, DO

## 2022-11-14 ENCOUNTER — Encounter: Payer: Self-pay | Admitting: Obstetrics and Gynecology

## 2022-11-15 ENCOUNTER — Ambulatory Visit
Admission: RE | Admit: 2022-11-15 | Discharge: 2022-11-15 | Disposition: A | Discharge status: Home / Self Care | Payer: Medicare PPO | Source: Ambulatory Visit | Attending: Hematology | Admitting: Hematology

## 2022-11-15 DIAGNOSIS — Z9011 Acquired absence of right breast and nipple: Secondary | ICD-10-CM | POA: Diagnosis not present

## 2022-11-15 DIAGNOSIS — R92333 Mammographic heterogeneous density, bilateral breasts: Secondary | ICD-10-CM | POA: Diagnosis not present

## 2022-11-15 DIAGNOSIS — I719 Aortic aneurysm of unspecified site, without rupture: Secondary | ICD-10-CM | POA: Diagnosis not present

## 2022-11-15 DIAGNOSIS — Z803 Family history of malignant neoplasm of breast: Secondary | ICD-10-CM

## 2022-11-15 MED ORDER — GADOPICLENOL 0.5 MMOL/ML IV SOLN
7.0000 mL | Freq: Once | INTRAVENOUS | Status: AC | PRN
  Administered 2022-11-15: 7 mL via INTRAVENOUS

## 2022-11-28 DIAGNOSIS — M7061 Trochanteric bursitis, right hip: Secondary | ICD-10-CM | POA: Diagnosis not present

## 2022-11-29 ENCOUNTER — Ambulatory Visit: Payer: Medicare PPO | Admitting: Orthopedic Surgery

## 2022-11-29 DIAGNOSIS — M7741 Metatarsalgia, right foot: Secondary | ICD-10-CM | POA: Diagnosis not present

## 2022-11-30 ENCOUNTER — Encounter: Payer: Self-pay | Admitting: Orthopedic Surgery

## 2022-11-30 NOTE — Progress Notes (Signed)
Office Visit Note   Patient: Maureen Figueroa           Date of Birth: 06/20/1950           MRN: [id number] Visit Date: 11/29/2022              Requested by: Thana Ates, MD [address] [place],  [place] [address] PCP: Thana Ates, MD  Chief Complaint  Patient presents with   Right Foot - Follow-up      HPI: Patient is a 72 year old woman who presents in follow-up for metatarsalgia second and third toes.  Patient has obtained custom orthotics and she states that symptomatically she is better.  Assessment & Plan: Visit Diagnoses:  1. Metatarsalgia, right foot     Plan: Patient will continue her physical therapy for her right hip arthritis.  She will follow-up with Tenny Craw for any modifications to the orthotic.  She will use Voltaren gel if symptomatic.  Follow-Up Instructions: No follow-ups on file.   Ortho Exam  Patient is alert, oriented, no adenopathy, well-dressed, normal affect, normal respiratory effort. Examination patient has a good dorsalis pedis pulse good ankle good subtalar motion she has a prominent second and third metatarsal but there are no ulcers or calluses.  She has good dorsiflexion of the ankle.  There is flexible clawing of the second and third toes.  Imaging: No results found. No images are attached to the encounter.  Labs: Lab Results  Component Value Date   HGBA1C 5.4 01/01/2014   ESRSEDRATE 1 12/31/2013     Lab Results  Component Value Date   ALBUMIN 4.4 02/24/2022   ALBUMIN 4.5 10/20/2021   ALBUMIN 4.6 08/24/2021    No results found for: "MG" Lab Results  Component Value Date   VD25OH 56 06/21/2013   VD25OH 43 06/04/2012    No results found for: "PREALBUMIN"    Latest Ref Rng & Units 02/24/2022    1:56 PM 08/24/2021   10:21 AM 02/24/2021    2:24 PM  CBC EXTENDED  WBC 4.0 - 10.5 K/uL 5.1  5.1  5.6   RBC 3.87 - 5.11 MIL/uL 4.20  4.38  4.08   Hemoglobin 12.0 - 15.0 g/dL 19.1  47.8  29.5   HCT 36.0 - 46.0 % 41.4   43.4  40.3   Platelets 150 - 400 K/uL 230  238  240   NEUT# 1.7 - 7.7 K/uL 2.9  3.0  3.1   Lymph# 0.7 - 4.0 K/uL 1.8  1.5  1.8      There is no height or weight on file to calculate BMI.  Orders:  No orders of the defined types were placed in this encounter.  No orders of the defined types were placed in this encounter.    Procedures: No procedures performed  Clinical Data: No additional findings.  ROS:  All other systems negative, except as noted in the HPI. Review of Systems  Objective: Vital Signs: LMP 09/24/2005   Specialty Comments:  No specialty comments available.  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   CAVUS DEFORMITY OF FOOT, ACQUIRED 12/25/2008   Past Medical History:  Diagnosis Date   Back injury 1994   back/neck injury    Cancer (HCC)    Breast cancer--right   Endometriosis    Family history of breast cancer    Family history of kidney cancer    Family history of lung cancer    Lyme disease, unspecified    followed by PCP   Osteoporosis    Pinched nerve in neck 12/2013   Postmenopausal bleeding 2016   uterine fibroid, on HRT   STD (sexually transmitted disease)    HSV   Urinary incontinence     Family History  Problem Relation Age of Onset   Hypertension Mother    COPD Mother    Hyperlipidemia Mother    Kidney cancer Father 48       dx late 53s   Breast cancer Sister 29   Pancreatic cancer Maternal Aunt        dx 52s (DDT  exposure)   Cancer Maternal Aunt        gastrointestinal dx 38s (DDT exposure)   Prostate cancer Maternal Uncle        (DDT exposure)   Osteoporosis Maternal Grandfather    Osteoporosis Paternal Grandmother    Breast cancer Paternal Grandmother 1       mastectomy   Lung cancer Paternal Grandfather        dx late 36s, smoker    Past Surgical History:  Procedure Laterality Date   BREAST SURGERY  07-23-08   benign Rt.breast biopsy--benign stromal fibrosis with calcifications:[facility]   CATARACT EXTRACTION, BILATERAL Bilateral 11/2016, 03/2017   EYE SURGERY Right    cyst removed x2    LASIK     OOPHORECTOMY  12/86   PELVIC LAPAROSCOPY  1986   endometriosis   TOTAL MASTECTOMY Right 08/03/2020   Procedure: RIGHT TOTAL MASTECTOMY;  Surgeon: Abigail Miyamoto, MD;  Location: [facility];  Service: General;  Laterality: Right;   Social History   Occupational History   Occupation: Runner, broadcasting/film/video   Tobacco Use   Smoking status: Never   Smokeless tobacco: Never  Vaping Use   Vaping status: Never Used  Substance and Sexual Activity   Alcohol use: Yes    Alcohol/week: 14.0 standard drinks of alcohol    Types: 14 Standard drinks or equivalent per week    Comment: daily wine or alcohol since 2020   Drug use: No   Sexual activity: Yes    Partners: Male    Birth control/protection: Post-menopausal    Comment: HSV 2+

## 2022-12-05 DIAGNOSIS — R69 Illness, unspecified: Secondary | ICD-10-CM | POA: Diagnosis not present

## 2022-12-13 ENCOUNTER — Ambulatory Visit: Payer: Medicare PPO | Admitting: Plastic Surgery

## 2022-12-13 DIAGNOSIS — Z853 Personal history of malignant neoplasm of breast: Secondary | ICD-10-CM | POA: Diagnosis not present

## 2022-12-13 DIAGNOSIS — M8588 Other specified disorders of bone density and structure, other site: Secondary | ICD-10-CM | POA: Diagnosis not present

## 2022-12-14 ENCOUNTER — Encounter: Payer: Self-pay | Admitting: Obstetrics and Gynecology

## 2022-12-19 ENCOUNTER — Telehealth: Payer: Self-pay

## 2022-12-19 NOTE — Telephone Encounter (Addendum)
  Called patient and relayed message below as per Dr. Mosetta Putt. Patient voiced full understanding.   ----- Message from Malachy Mood sent at 12/19/2022  7:24 AM EST ----- Please let pt know her MRI results, and follow up with her PCP for aortic aneurysm to see if she needs to see vascular surgeon. Thanks   Malachy Mood

## 2022-12-29 ENCOUNTER — Other Ambulatory Visit: Payer: Self-pay | Admitting: Internal Medicine

## 2022-12-29 DIAGNOSIS — K006 Disturbances in tooth eruption: Secondary | ICD-10-CM | POA: Diagnosis not present

## 2022-12-29 DIAGNOSIS — I7121 Aneurysm of the ascending aorta, without rupture: Secondary | ICD-10-CM

## 2023-01-03 ENCOUNTER — Ambulatory Visit: Payer: Medicare PPO | Admitting: Plastic Surgery

## 2023-01-12 ENCOUNTER — Ambulatory Visit
Admission: RE | Admit: 2023-01-12 | Discharge: 2023-01-12 | Disposition: A | Discharge status: Home / Self Care | Payer: Medicare PPO | Source: Ambulatory Visit | Attending: Internal Medicine | Admitting: Internal Medicine

## 2023-01-12 DIAGNOSIS — R911 Solitary pulmonary nodule: Secondary | ICD-10-CM | POA: Diagnosis not present

## 2023-01-12 DIAGNOSIS — I77810 Thoracic aortic ectasia: Secondary | ICD-10-CM | POA: Diagnosis not present

## 2023-01-12 DIAGNOSIS — I7121 Aneurysm of the ascending aorta, without rupture: Secondary | ICD-10-CM

## 2023-01-12 MED ORDER — IOPAMIDOL (ISOVUE-370) INJECTION 76%
100.0000 mL | Freq: Once | INTRAVENOUS | Status: AC | PRN
  Administered 2023-01-12: 75 mL via INTRAVENOUS

## 2023-01-23 IMAGING — MR MR BREAST BILAT WO/W CM
8 of 12 series · 31 of 48 positions shown · IV contrast (6ml Gadavist)
Comparison: Previous exam(s).

CLINICAL DATA: Recently diagnosed DCIS. Evaluate extent of disease.

LABS:  None
EXAM:
BILATERAL BREAST MRI WITH AND WITHOUT CONTRAST
TECHNIQUE: Multiplanar, multisequence MR images of both breasts were obtained
prior to and following the intravenous administration of 6 ml of
Gadavist

[Series 2: t2_tirm_tra ipat (a-p) · axial · 3.0mm · 0.66mm/px · 1 of 60 slices shown]
[im 1/60]
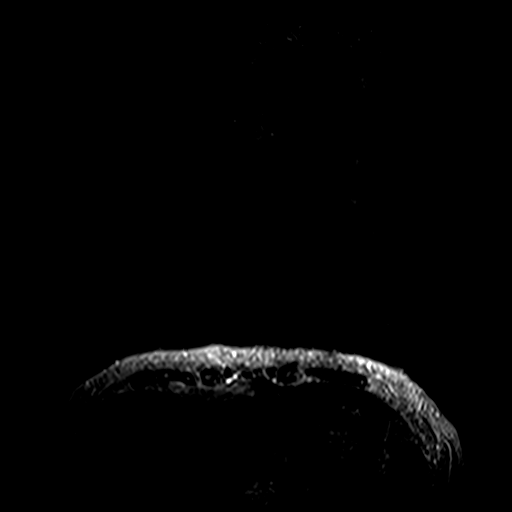

[Series 3: fl3d pre-cm no · axial · non-contrast · 1.2mm · 0.86mm/px · z∈[-79,+111]mm · 5 of 160 slices shown]
[im 1/160]
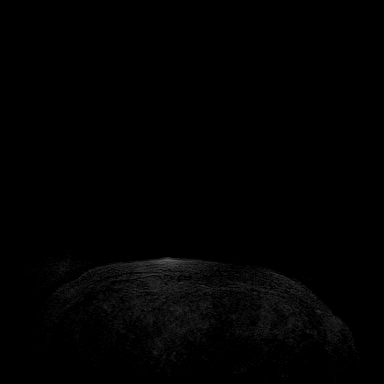
[im 40/160]
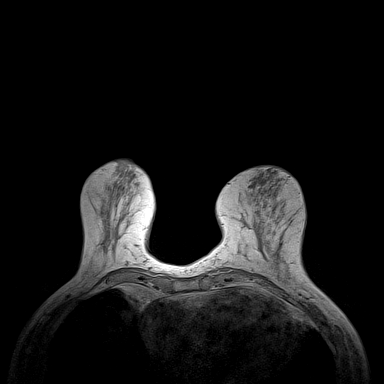
[im 80/160]
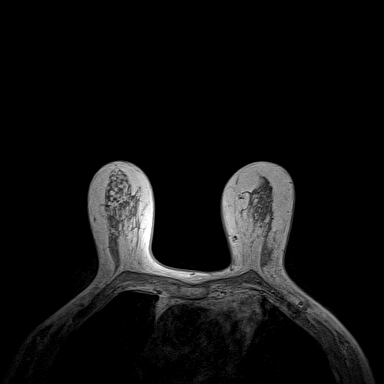
[im 120/160]
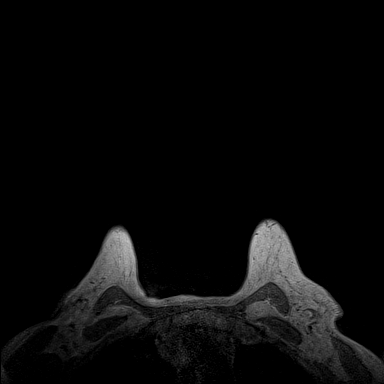
[im 160/160]
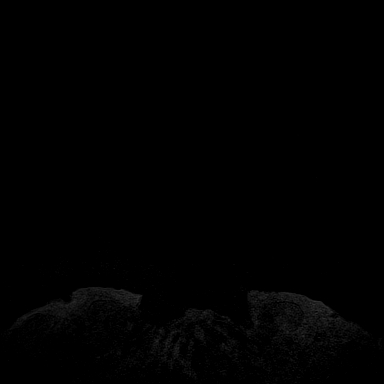

[Series 4: fl3d pre-cm · axial · non-contrast · 1.2mm · 0.86mm/px · z∈[-79,+111]mm · 5 of 160 slices shown]
[im 1/160]
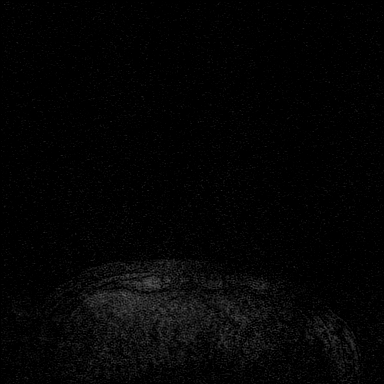
[im 40/160]
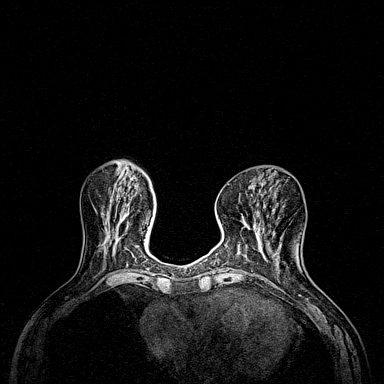
[im 80/160]
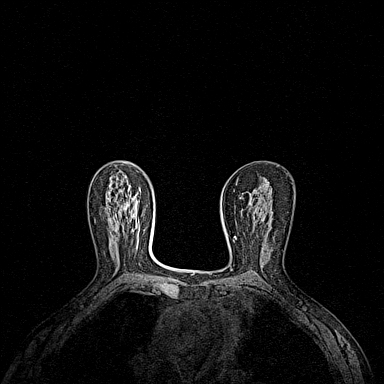
[im 120/160]
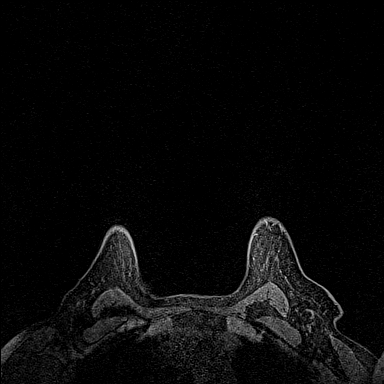
[im 160/160]
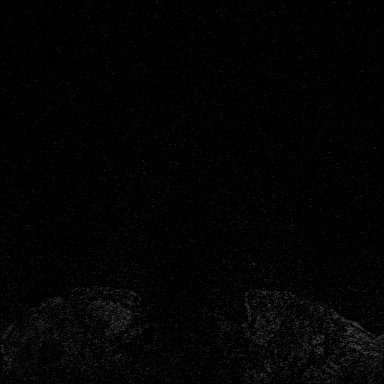

[Series 5: fl3d post-cm 20 · axial · 1.2mm · 0.86mm/px · z∈[-79,+111]mm · 5 of 160 slices shown (1 of 3)]
[im 1/160]
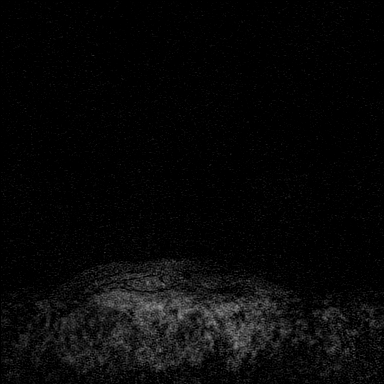
[im 40/160]
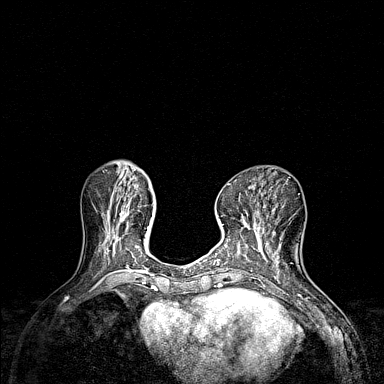
[im 80/160]
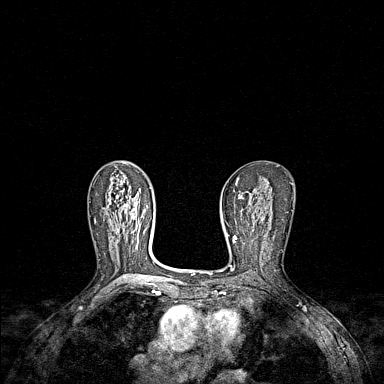
[im 120/160]
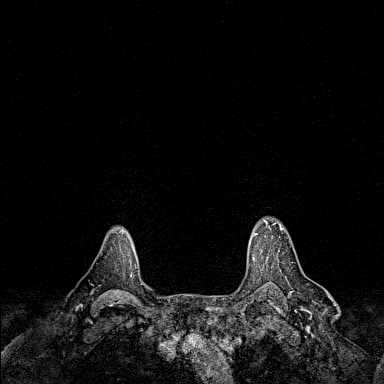
[im 160/160]
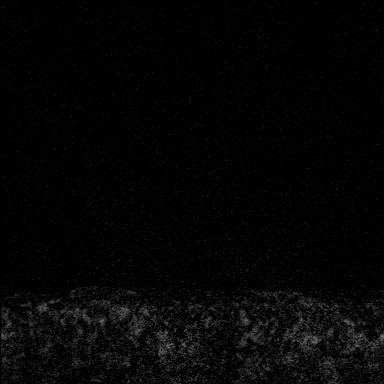

[Series 6: fl3d post-cm 20 · axial · 1.2mm · 0.86mm/px · z∈[-79,+111]mm · 5 of 160 slices shown (2 of 3)]
[im 1/160]
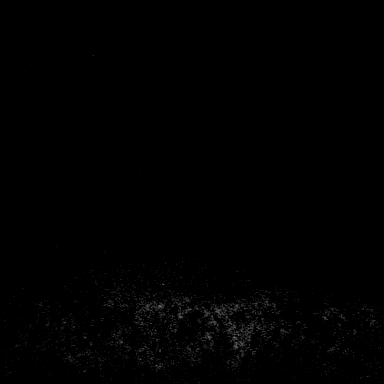
[im 40/160]
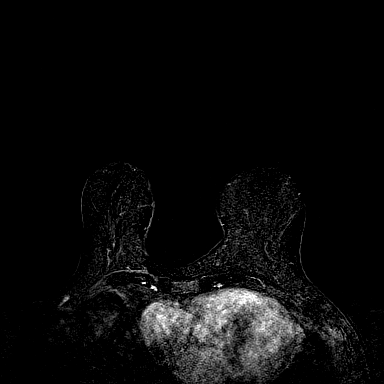
[im 80/160]
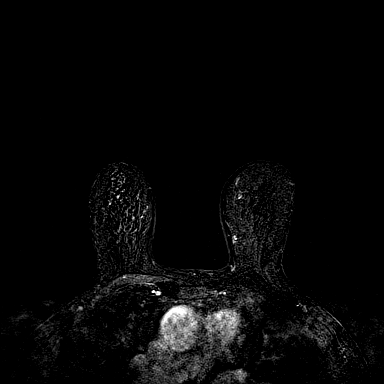
[im 120/160]
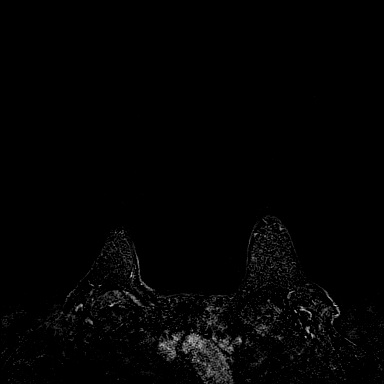
[im 160/160]
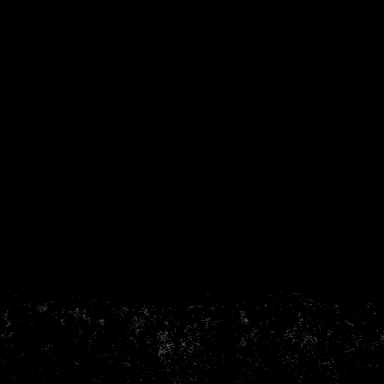

[Series 7: fl3d post-cm 20 · axial · 192.0mm · 0.86mm/px · 1 of 1 slices shown (3 of 3)]
[im 1/1]
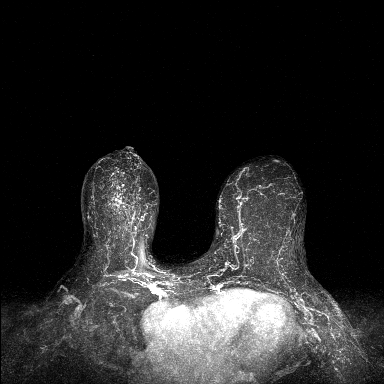

[Series 8: fl3d post-cm 3min · axial · 1.2mm · 0.86mm/px · z∈[-79,+111]mm · 6 of 160 slices shown]
[im 1/160]
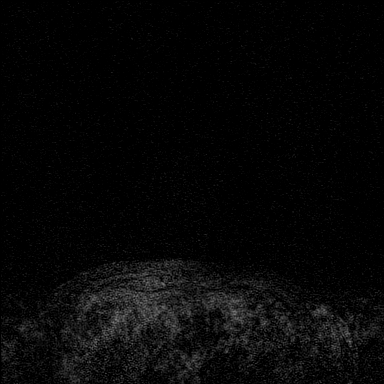
[im 32/160]
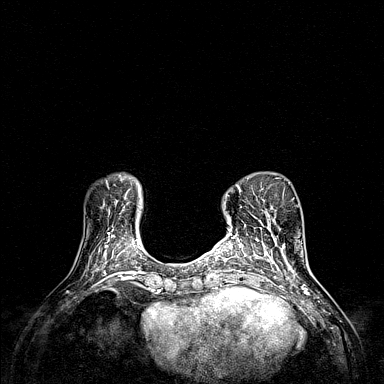
[im 64/160]
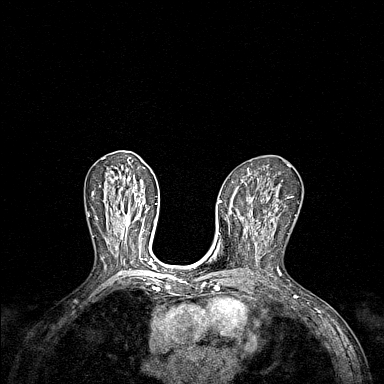
[im 96/160]
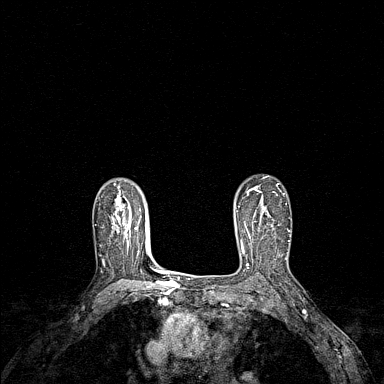
[im 128/160]
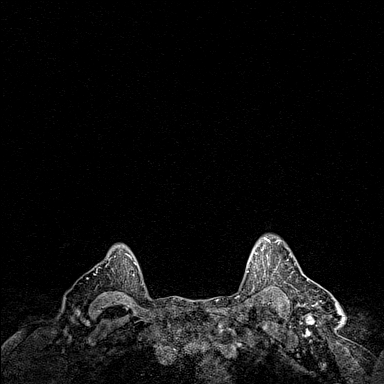
[im 160/160]
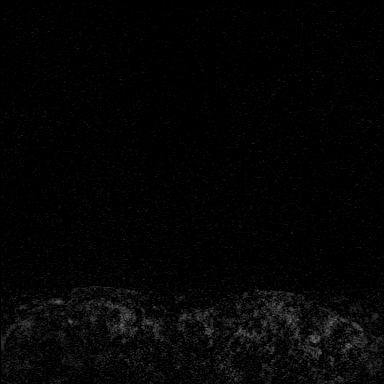

[Series 9: fl3d post-cm 3min_sub · axial · 1.2mm · 0.86mm/px · z∈[-79,-4]mm · 3 of 160 slices shown]
[im 1/160]
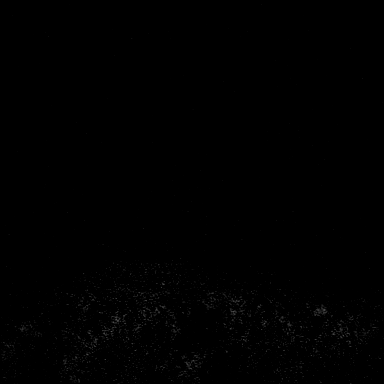
[im 32/160]
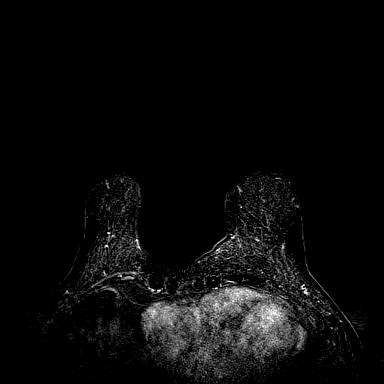
[im 64/160]
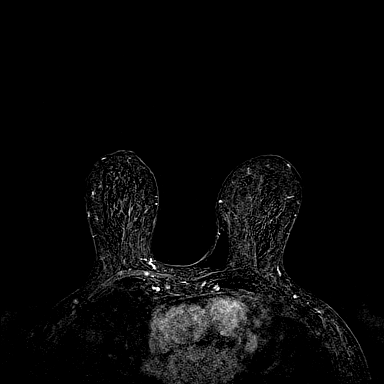

[31 of 48 positions shown; findings below may reference images not displayed]

Three-dimensional MR images were rendered by post-processing of the
original MR data on an independent workstation. The
three-dimensional MR images were interpreted, and findings are
reported in the following complete MRI report for this study. Three
dimensional images were evaluated at the independent interpreting
workstation using the DynaCAD thin client.
FINDINGS: Breast composition: c. Heterogeneous fibroglandular tissue.

Background parenchymal enhancement: Mild

Right breast: The biopsy clip at 12 o'clock in the right breast is
at the site of recently diagnosed DCIS. There is increased non mass
enhancement in the right breast centrally at 12 o'clock which is
ill-defined but asymmetric to the left. The total span of
enhancement is difficult to accurately measure given the ill-defined
nature. However, the non mass enhancement measures approximately
cm in AP dimension as seen on series 6, image 84. Review of the
outside diagnostic mammogram demonstrates calcifications anterior to
the biopsied calcifications. There is also a separate group of
calcifications which is slightly inferior and lateral to the
biopsied calcifications. When taking these calcifications into
account, the total span of abnormal calcifications is up to 5.1 cm.
These additional calcifications likely correlate with the
ill-defined non mass enhancement seen on MRI. No other abnormalities
in the right breast.

Left breast: No mass or abnormal enhancement.

Lymph nodes: No abnormal appearing lymph nodes.

Ancillary findings:  None.
IMPRESSION: 1. There is ill-defined non mass enhancement in the central right
breast at 12 o'clock spanning up to 4.5 cm. Review of the outside
mammogram demonstrates a group of calcifications anterior to the
recently biopsied DCIS and another group slightly inferior and
lateral to the recently diagnosed DCIS. Taking these 2 additional
groups of calcifications into account, the total span of
calcifications is 5.1 cm likely correlating with the non mass
enhancement seen today. The non mass enhancement today and the
additional groups of calcifications could also represent DCIS.
2. No other abnormalities in either breast.

RECOMMENDATION:
Recommend stereotactic biopsy of the more anteriorly located group
of calcifications seen on diagnostic mammography described above.
Recommend stereotactic biopsy of the other group of calcifications
located lateral and inferior to the previously diagnosed DCIS, also
described above.

BI-RADS CATEGORY  4: Suspicious.

## 2023-01-27 DIAGNOSIS — M7061 Trochanteric bursitis, right hip: Secondary | ICD-10-CM | POA: Diagnosis not present

## 2023-01-30 ENCOUNTER — Institutional Professional Consult (permissible substitution): Payer: Medicare PPO | Admitting: Surgical

## 2023-01-30 VITALS — BP 148/86 | HR 78 | Resp 20 | Ht 65.0 in | Wt 165.0 lb

## 2023-01-30 DIAGNOSIS — I7121 Aneurysm of the ascending aorta, without rupture: Secondary | ICD-10-CM

## 2023-01-30 NOTE — Patient Instructions (Signed)
 Blood pressure diary and healthy lifestyle as discussed

## 2023-01-30 NOTE — Progress Notes (Signed)
 [address]       [place] [address]             [phone]        NEELAH MANNINGS [id number] 30-Mar-1950   History of Present Illness: Patient is a 73 year old female seen in the office on today's date consultation for 3.9 cm ascending aortic enlargement.  He recently had a breast MRI done for follow-up of her breast cancer diagnosis which showed a 4.1 cm ascending aorta.  The CTA scan as described below revealed it to be in the 3.9 cm range.  She has no specific history of hypertension but it is noted that her blood pressure was elevated on today's date.  There is a healthy lifestyle and teaches gerontology at [employer].  Cardiac review of symptoms is remarkable for some shortness of breath with exertion as well as occasional palpitations which have occurred over most of her life and more recently some ankle edema.  He does not get chest pain.  She is a non-smoker without previous history.   Past Medical History:  Diagnosis Date   Back injury 1994   back/neck injury    Cancer (HCC)    Breast cancer--right   Endometriosis    Family history of breast cancer    Family history of kidney cancer    Family history of lung cancer    Lyme disease, unspecified    followed by PCP   Osteoporosis    Pinched nerve in neck 12/2013   Postmenopausal bleeding 2016   uterine fibroid, on HRT   STD (sexually transmitted disease)    HSV   Urinary incontinence      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   Overactive bladder 11/15/2017   Dizziness 12/31/2013   Numbness 12/31/2013   Hypokalemia 12/31/2013   Numbness and tingling 12/31/2013   PATELLO-FEMORAL  SYNDROME 12/25/2008   CAVUS DEFORMITY OF FOOT, ACQUIRED 12/25/2008    Allergies  Allergen Reactions   Metaxalone Other (See Comments)   Chocolate Swelling   Cocoa Swelling   Penicillins Other (See Comments) and Swelling    swelling swelling     Social History   Occupational History   Occupation: runner, broadcasting/film/video   Tobacco Use   Smoking status: Never   Smokeless tobacco: Never  Vaping Use   Vaping status: Never Used  Substance and Sexual Activity   Alcohol  use: Yes    Alcohol /week: 14.0 standard drinks of alcohol     Types: 14 Standard drinks or equivalent per week    Comment: daily wine or alcohol  since 2020   Drug use: No   Sexual activity: Yes    Partners: Male    Birth control/protection: Post-menopausal    Comment: HSV 2+       Current Outpatient Medications on File Prior to Visit  Medication Sig Dispense Refill   Ascorbic Acid (VITAMIN C  PO) Take 500 mg by mouth daily.      Calcium Carb-Cholecalciferol (CALCIUM + D3) 600-200 MG-UNIT TABS 1 tablet     carisoprodol  (SOMA ) 350 MG tablet 1 tablet as needed     Cholecalciferol (D3)  50 MCG (2000 UT) TABS      meloxicam (MOBIC) 15 MG tablet Take 1 tablet by mouth daily.     Multiple Vitamin (MULTI-VITAMIN PO) Take by mouth.     NONFORMULARY OR COMPOUNDED ITEM Vitamin E 200 u/ml vaginal suppositories.  Place one per vaginal at hs twice weekly. 24 each 3   raloxifene  (EVISTA ) 60 MG tablet TAKE 1 TABLET(60 MG) BY MOUTH DAILY 90 tablet 3   selenium sulfide (SELSUN) 2.5 % shampoo      tretinoin  microspheres (RETIN-A  MICRO) 0.1 % gel Apply 1 application topically at bedtime.      valACYclovir  (VALTREX ) 1000 MG tablet TAKE 1 TABLET BY MOUTH AT BEDTIME. TAKE 1 TABLET TWICE DAILY X 3 DAYS FOR GENITAL OUTBREAK AND 2 TABS EVERY 12 HOURS X 1 DAY FOR ORAL OUTBREAK 110 tablet 3   VITAMIN E EX Apply topically. suppository     lidocaine  (LIDODERM ) 5 % Place 1 patch onto the skin daily. Remove & Discard patch within 12 hours or as directed by MD  30 patch 0   terbinafine (LAMISIL) 250 MG tablet Take 1 tablet by mouth daily.     No current facility-administered medications on file prior to visit.     BP (!) 148/86   Pulse 78   Resp 20   Ht 5' 5 (1.651 m)   Wt 165 lb (74.8 kg)   LMP 09/24/2005   SpO2 97% Comment: RA  BMI 27.46 kg/m   Physical Exam Vitals reviewed.  Constitutional:      General: She is not in acute distress.    Appearance: Normal appearance. She is normal weight. She is not ill-appearing.  HENT:     Head: Atraumatic.  Eyes:     General: No scleral icterus. Neck:     Vascular: No carotid bruit.  Cardiovascular:     Rate and Rhythm: Normal rate and regular rhythm.     Pulses: Normal pulses.     Heart sounds: No murmur heard.    No gallop.  Pulmonary:     Effort: Pulmonary effort is normal.     Breath sounds: Normal breath sounds.  Abdominal:     General: Abdomen is flat.     Palpations: Abdomen is soft.  Musculoskeletal:        General: No swelling.     Right lower leg: No edema.     Left lower leg: No edema.  Skin:    General: Skin is warm and dry.  Neurological:     General: No focal deficit present.     Mental Status: She is alert.  Psychiatric:        Thought Content: Thought content normal.     CTA Results:     Narrative & Impression  CLINICAL DATA:  Thoracic aortic aneurysm   EXAM: CT ANGIOGRAPHY CHEST WITH CONTRAST   TECHNIQUE: Multidetector CT imaging of the chest was performed using the standard protocol during bolus administration of intravenous contrast. Multiplanar CT image reconstructions and MIPs were obtained to evaluate the vascular anatomy.   RADIATION DOSE REDUCTION: This exam was performed according to the departmental dose-optimization program which includes automated exposure control, adjustment of the mA and/or kV according to patient size and/or use of iterative reconstruction technique.   CONTRAST:  75mL ISOVUE -370 IOPAMIDOL  (ISOVUE -370) INJECTION  76%   COMPARISON:  None Available.   FINDINGS: Cardiovascular: Conventional 3 vessel aortic arch. Elongation of the aortic isthmus and proximal descending thoracic aorta consistent with a type 2  arch. The aortic root is normal in caliber. The ascending thoracic aorta is ectatic measuring up to 3.9 cm but not definitively aneurysmal. Trace atherosclerotic plaque. No evidence of dissection. The transverse and descending thoracic aorta are normal in caliber. Unremarkable main pulmonary artery. The heart is normal in size. No pericardial effusion.   Mediastinum/Nodes: Incidental note is made of a small subcentimeter cyst or nodule in the left thyroid  gland. This lesion does not meet size criteria to warrant dedicated imaging evaluation and is therefore considered incidental. No imaging follow-up is recommended. No suspicious lymphadenopathy or mass. Unremarkable thoracic esophagus.   Lungs/Pleura: 5.5 mm pulmonary nodule in the central right lower lobe (image 104 series 6). The lungs are otherwise clear.   Upper Abdomen: No acute abnormality. Simple cyst exophytic from the anterior interpolar left kidney. No imaging follow-up is recommended.   Musculoskeletal: No acute fracture or aggressive appearing lytic or blastic osseous lesion. Surgical changes of right mastectomy.   Review of the MIP images confirms the above findings.   IMPRESSION: 1. Ectatic but nonaneurysmal ascending thoracic aorta with a maximal diameter of 3.9 cm. 2. Trace atherosclerotic vascular calcifications. 3. Nonspecific 5.5 mm pulmonary nodule in the central right lower lobe. While likely a benign granuloma, given surgical changes of prior mastectomy further evaluate is recommended to exclude the unlikely possibility of a metastatic pulmonary nodule. Recommend follow-up noncontrast enhanced CT scan of the chest in 3 months.   Aortic Atherosclerosis (ICD10-I70.0).     Electronically Signed   By: Wilkie Lent M.D.   On: 01/15/2023 16:09   A/P: CTA findings of 3.9 cm ectatic aorta discussed with patient as very borderline for even requiring further monitoring at this point.  Nodule noted in the right lower lobe is fairly unremarkable but with her history of breast cancer we will repeat scan in 3 months.  At that visit it can be determined whether she needs ongoing follow-up in CT surgery office.  Due to her blood pressure being elevated I have recommended and she is agreeable to having a blood pressure diary and contacting her primary care if noted that her blood pressures are consistently elevated.  We also talked about routine lifestyle modifications in terms of overall general health including such things as exercise and the importance of good nutrition.    Risk Modification:  Statin:  none  Smoking cessation instruction/counseling given:  non smoker  Patient was counseled on importance of Blood Pressure Control.  Despite Medical intervention if the patient notices persistently elevated blood pressure readings.  They are instructed to contact their Primary Care Physician  Please avoid use of Fluoroquinolones as this can potentially increase your risk of Aortic Rupture and/or Dissection  Patient educated on signs and symptoms of Aortic Dissection, handout also provided in AVS  Lemond FORBES Cera, PA-C 01/30/23

## 2023-01-31 DIAGNOSIS — L738 Other specified follicular disorders: Secondary | ICD-10-CM | POA: Diagnosis not present

## 2023-01-31 DIAGNOSIS — L910 Hypertrophic scar: Secondary | ICD-10-CM | POA: Diagnosis not present

## 2023-01-31 DIAGNOSIS — I788 Other diseases of capillaries: Secondary | ICD-10-CM | POA: Diagnosis not present

## 2023-01-31 DIAGNOSIS — D1801 Hemangioma of skin and subcutaneous tissue: Secondary | ICD-10-CM | POA: Diagnosis not present

## 2023-01-31 DIAGNOSIS — D235 Other benign neoplasm of skin of trunk: Secondary | ICD-10-CM | POA: Diagnosis not present

## 2023-01-31 DIAGNOSIS — B360 Pityriasis versicolor: Secondary | ICD-10-CM | POA: Diagnosis not present

## 2023-02-27 ENCOUNTER — Other Ambulatory Visit: Payer: Self-pay

## 2023-02-27 DIAGNOSIS — D0511 Intraductal carcinoma in situ of right breast: Secondary | ICD-10-CM

## 2023-02-28 ENCOUNTER — Encounter: Payer: Self-pay | Admitting: Nurse Practitioner

## 2023-02-28 ENCOUNTER — Inpatient Hospital Stay: Payer: Medicare PPO | Attending: Hematology

## 2023-02-28 ENCOUNTER — Inpatient Hospital Stay: Payer: Medicare PPO | Admitting: Nurse Practitioner

## 2023-02-28 VITALS — BP 137/80 | HR 65 | Temp 97.9°F | Resp 17 | Ht 65.0 in | Wt 164.3 lb

## 2023-02-28 DIAGNOSIS — R911 Solitary pulmonary nodule: Secondary | ICD-10-CM | POA: Diagnosis not present

## 2023-02-28 DIAGNOSIS — Z79810 Long term (current) use of selective estrogen receptor modulators (SERMs): Secondary | ICD-10-CM | POA: Insufficient documentation

## 2023-02-28 DIAGNOSIS — D0511 Intraductal carcinoma in situ of right breast: Secondary | ICD-10-CM | POA: Diagnosis not present

## 2023-02-28 DIAGNOSIS — Z9011 Acquired absence of right breast and nipple: Secondary | ICD-10-CM | POA: Insufficient documentation

## 2023-02-28 LAB — CBC WITH DIFFERENTIAL (CANCER CENTER ONLY)
Abs Immature Granulocytes: 0 10*3/uL (ref 0.00–0.07)
Basophils Absolute: 0 10*3/uL (ref 0.0–0.1)
Basophils Relative: 1 %
Eosinophils Absolute: 0.3 10*3/uL (ref 0.0–0.5)
Eosinophils Relative: 5 %
HCT: 42.8 % (ref 36.0–46.0)
Hemoglobin: 14.3 g/dL (ref 12.0–15.0)
Immature Granulocytes: 0 %
Lymphocytes Relative: 34 %
Lymphs Abs: 1.8 10*3/uL (ref 0.7–4.0)
MCH: 33.4 pg (ref 26.0–34.0)
MCHC: 33.4 g/dL (ref 30.0–36.0)
MCV: 100 fL (ref 80.0–100.0)
Monocytes Absolute: 0.4 10*3/uL (ref 0.1–1.0)
Monocytes Relative: 8 %
Neutro Abs: 2.8 10*3/uL (ref 1.7–7.7)
Neutrophils Relative %: 52 %
Platelet Count: 217 10*3/uL (ref 150–400)
RBC: 4.28 MIL/uL (ref 3.87–5.11)
RDW: 12.8 % (ref 11.5–15.5)
WBC Count: 5.3 10*3/uL (ref 4.0–10.5)
nRBC: 0 % (ref 0.0–0.2)

## 2023-02-28 LAB — CMP (CANCER CENTER ONLY)
ALT: 16 U/L (ref 0–44)
AST: 11 U/L — ABNORMAL LOW (ref 15–41)
Albumin: 4.3 g/dL (ref 3.5–5.0)
Alkaline Phosphatase: 53 U/L (ref 38–126)
Anion gap: 4 — ABNORMAL LOW (ref 5–15)
BUN: 17 mg/dL (ref 8–23)
CO2: 29 mmol/L (ref 22–32)
Calcium: 9 mg/dL (ref 8.9–10.3)
Chloride: 105 mmol/L (ref 98–111)
Creatinine: 0.56 mg/dL (ref 0.44–1.00)
GFR, Estimated: >60 mL/min (ref >60)
Glucose, Bld: 98 mg/dL (ref 70–99)
Potassium: 4.1 mmol/L (ref 3.5–5.1)
Sodium: 138 mmol/L (ref 135–145)
Total Bilirubin: 1 mg/dL (ref 0.0–1.2)
Total Protein: 6.3 g/dL — ABNORMAL LOW (ref 6.5–8.1)

## 2023-02-28 NOTE — Progress Notes (Signed)
 Patient Care Team: Dwight Trula SQUIBB, MD as PCP - General (Internal Medicine) Glean Stephane BROCKS, RN as Oncology Nurse Navigator Tyree Nanetta SAILOR, RN as Oncology Nurse Navigator Vernetta Berg, MD as Consulting Physician (General Surgery) Lanny Callander, MD as Consulting Physician (Hematology) Dewey Rush, MD as Consulting Physician (Radiation Oncology) Ann Mayme POUR, NP as Nurse Practitioner (Nurse Practitioner) Cathlyn BROCKS Nikki Bobie FORBES, MD as Consulting Physician (Obstetrics and Gynecology)   CHIEF COMPLAINT: Follow-up right breast DCIS  Oncology History Overview Note  Cancer Staging Ductal carcinoma in situ (DCIS) of right breast Staging form: Breast, AJCC 8th Edition - Clinical stage from 06/03/2020: Stage 0 (cTis (DCIS), cN0, cM0, ER+, PR+, HER2: Not Assessed) - Unsigned Stage prefix: Initial diagnosis Nuclear grade: G2 Laterality: Right Staged by: Pathologist and managing physician Stage used in treatment planning: Yes National guidelines used in treatment planning: Yes Type of national guideline used in treatment planning: NCCN - Pathologic stage from 08/03/2020: Stage Unknown (pTis (DCIS), pNX, cM0, G2, ER+, PR-, HER2: Not Assessed) - Signed by Ann Mayme POUR, NP on 11/25/2020 Histologic grading system: 3 grade system    Ductal carcinoma in situ (DCIS) of right breast  05/13/2020 Mammogram   Impression: The 1.8cm grouped amophous calcifications in teh right breasr upper outer aspect middle depth 7cmfn are suspicious. A biopsy is recommended.    05/26/2020 Initial Biopsy   Diagnosis Breast, right, nmededle core biopsy, 12:00, 7cmfn -DUCTAL CARCINOMA IN SITU WITH CALCIFICATIONS.  The DCIS has intermediate nuclear grade.     05/29/2020 Initial Diagnosis   Ductal carcinoma in situ (DCIS) of right breast   06/14/2020 Genetic Testing   Negative genetic testing:  No pathogenic variants detected on the [employer] BRCAplus panel and CancerNext-Expanded + RNAinsight panel. The report  dates are 06/14/2020 and 07/02/2020, respectively. A variant of uncertain significance (VUS) was detected in the DICER1 gene called p.S299L (c.896C>T).  The BRCAplus panel offered by [employer] and includes sequencing and deletion/duplication analysis for the following 8 genes: ATM, BRCA1, BRCA2, CDH1, CHEK2, PALB2, PTEN, and TP53. The CancerNext-Expanded + RNAinsight gene panel offered by [employer] and includes sequencing and rearrangement analysis for the following 77 genes: AIP, ALK, APC, ATM, AXIN2, BAP1, BARD1, BLM, BMPR1A, BRCA1, BRCA2, BRIP1, CDC73, CDH1, CDK4, CDKN1B, CDKN2A, CHEK2, CTNNA1, DICER1, FANCC, FH, FLCN, GALNT12, KIF1B, LZTR1, MAX, MEN1, MET, MLH1, MSH2, MSH3, MSH6, MUTYH, NBN, NF1, NF2, NTHL1, PALB2, PHOX2B, PMS2, POT1, PRKAR1A, PTCH1, PTEN, RAD51C, RAD51D, RB1, RECQL, RET, SDHA, SDHAF2, SDHB, SDHC, SDHD, SMAD4, SMARCA4, SMARCB1, SMARCE1, STK11, SUFU, TMEM127, TP53, TSC1, TSC2, VHL and XRCC2 (sequencing and deletion/duplication); EGFR, EGLN1, HOXB13, KIT, MITF, PDGFRA, POLD1 and POLE (sequencing only); EPCAM and GREM1 (deletion/duplication only). RNA data is routinely analyzed for use in variant interpretation for all genes.   06/15/2020 Imaging   Bilateral Breast MRI  IMPRESSION: 1. There is ill-defined non mass enhancement in the central right breast at 12 o'clock spanning up to 4.5 cm. Review of the outside mammogram demonstrates a group of calcifications anterior to the recently biopsied DCIS and another group slightly inferior and lateral to the recently diagnosed DCIS. Taking these 2 additional groups of calcifications into account, the total span of calcifications is 5.1 cm likely correlating with the non mass enhancement seen today. The non mass enhancement today and the additional groups of calcifications could also represent DCIS. 2. No other abnormalities in either breast.   07/01/2020 Pathology Results   Diagnosis 1. Breast, right, needle core biopsy -  INTERMEDIATE TO HIGH-GRADE  DUCTAL CARCINOMA IN SITU WITH FOCAL NECROSIS AND CALCIFICATIONS. SEE NOTE 2. Breast, right, needle core biopsy - ATYPICAL LOBULAR HYPERPLASIA. SEE NOTE - FIBROCYSTIC CHANGE WITH CALCIFICATIONS  1. PROGNOSTIC INDICATORS Results: IMMUNOHISTOCHEMICAL AND MORPHOMETRIC ANALYSIS PERFORMED MANUALLY Estrogen Receptor: 95%, POSITIVE, STRONG STAINING INTENSITY Progesterone  Receptor: 0%, NEGATIVE   08/03/2020 Pathology Results   FINAL MICROSCOPIC DIAGNOSIS:   A. BREAST, RIGHT, MASTECTOMY:  -  Ductal carcinoma in situ, intermediate grade, 0.9 cm  -  Margins uninvolved by carcinoma (2.0; posterior margin)  -  Lobular neoplasia (atypical lobular hyperplasia)  -  Fibrocystic changes  -  Calcifications associated with carcinoma and benign breast tissue  -  Previous biopsy site changes    08/03/2020 Cancer Staging   Staging form: Breast, AJCC 8th Edition - Pathologic stage from 08/03/2020: Stage Unknown (pTis (DCIS), pNX, cM0, G2, ER+, PR-, HER2: Not Assessed) - Signed by Jernie Schutt K, NP on 11/25/2020 Histologic grading system: 3 grade system   11/25/2020 Survivorship   SCP delivered by Gavina Dildine, NP   11/25/2020 -  Anti-estrogen oral therapy   Begin Raloxifene  60 mg po once daily, goal 5 years      CURRENT THERAPY: Raloxifene , started 11/25/2020   INTERVAL HISTORY Maureen Figueroa returns for follow-up as scheduled, last seen by Dr. Lanny 02/2022. Screening breast MRI 10/2022 detected a thoracic aneurysm so she had a CT which found an incidental small R lung nodule.  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  She is trying to decipher whether the pain is from her raloxifene  or if she needs a hip replacement.  She has hot flashes and annoying weight gain.  Denies breast concerns such as new lump/mass, nipple discharge or inversion, or skin change.  ROS  All other systems reviewed and negative  Past Medical History:   Diagnosis Date   Back injury 1994   back/neck injury    Cancer (HCC)    Breast cancer--right   Endometriosis    Family history of breast cancer    Family history of kidney cancer    Family history of lung cancer    Lyme disease, unspecified    followed by PCP   Osteoporosis    Pinched nerve in neck 12/2013   Postmenopausal bleeding 2016   uterine fibroid, on HRT   STD (sexually transmitted disease)    HSV   Urinary incontinence      Past Surgical History:  Procedure Laterality Date   BREAST SURGERY  07-23-08   benign Rt.breast biopsy--benign stromal fibrosis with calcifications:[facility]   CATARACT EXTRACTION, BILATERAL Bilateral 11/2016, 03/2017   EYE SURGERY Right    cyst removed x2    LASIK     OOPHORECTOMY  12/86   PELVIC LAPAROSCOPY  1986   endometriosis   TOTAL MASTECTOMY Right 08/03/2020   Procedure: RIGHT TOTAL MASTECTOMY;  Surgeon: Vernetta Berg, MD;  Location: [facility];  Service: General;  Laterality: Right;     Outpatient Encounter Medications as of 02/28/2023  Medication Sig   Ascorbic Acid (VITAMIN C  PO) Take 500 mg by mouth daily.    Calcium Carb-Cholecalciferol (CALCIUM + D3) 600-200 MG-UNIT TABS 1 tablet   carisoprodol  (SOMA ) 350 MG tablet 1 tablet as needed   Cholecalciferol (D3) 50 MCG (2000 UT) TABS    meloxicam (MOBIC) 15 MG tablet Take 1 tablet by mouth daily.   Multiple Vitamin (MULTI-VITAMIN PO) Take by mouth.   NONFORMULARY OR COMPOUNDED ITEM  Vitamin E 200 u/ml vaginal suppositories.  Place one per vaginal at hs twice weekly.   raloxifene  (EVISTA ) 60 MG tablet TAKE 1 TABLET(60 MG) BY MOUTH DAILY   selenium sulfide (SELSUN) 2.5 % shampoo    tretinoin  microspheres (RETIN-A  MICRO) 0.1 % gel Apply 1 application topically at bedtime.    valACYclovir  (VALTREX ) 1000 MG tablet TAKE 1 TABLET BY MOUTH AT BEDTIME. TAKE 1 TABLET TWICE DAILY X 3 DAYS FOR GENITAL OUTBREAK AND 2 TABS EVERY 12 HOURS X 1 DAY FOR ORAL OUTBREAK   VITAMIN E EX Apply  topically. suppository   [DISCONTINUED] lidocaine  (LIDODERM ) 5 % Place 1 patch onto the skin daily. Remove & Discard patch within 12 hours or as directed by MD   [DISCONTINUED] terbinafine (LAMISIL) 250 MG tablet Take 1 tablet by mouth daily.   No facility-administered encounter medications on file as of 02/28/2023.     Today's Vitals   02/28/23 1133 02/28/23 1134 02/28/23 1150  BP: (!) 158/92 137/80   Pulse: 65    Resp: 17    Temp: 97.9 F (36.6 C)    TempSrc: Oral    SpO2: 99%    Weight: 164 lb 5 oz (74.5 kg)    Height: 5' 5 (1.651 m)    PainSc:   0-No pain   Body mass index is 27.34 kg/m.   PHYSICAL EXAM GENERAL:alert, no distress and comfortable SKIN: no rash  EYES: sclera clear NECK: without mass LYMPH:  no palpable cervical or supraclavicular lymphadenopathy  LUNGS:  normal breathing effort HEART: no lower extremity edema ABDOMEN: abdomen soft, non-tender and normal bowel sounds NEURO: alert & oriented x 3 with fluent speech, no focal motor/sensory deficits Breast exam: S/p right mastectomy, incisions completely healed.  No palpable mass or nodularity at the right chest wall, left breast, or either axilla that I could appreciate   CBC    Component Value Date/Time   WBC 5.3 02/28/2023 1106   WBC 5.1 08/24/2021 1021   RBC 4.28 02/28/2023 1106   HGB 14.3 02/28/2023 1106   HGB 13.0 06/21/2013 1612   HCT 42.8 02/28/2023 1106   PLT 217 02/28/2023 1106   MCV 100.0 02/28/2023 1106   MCH 33.4 02/28/2023 1106   MCHC 33.4 02/28/2023 1106   RDW 12.8 02/28/2023 1106   LYMPHSABS 1.8 02/28/2023 1106   MONOABS 0.4 02/28/2023 1106   EOSABS 0.3 02/28/2023 1106   BASOSABS 0.0 02/28/2023 1106     CMP     Component Value Date/Time   NA 138 02/28/2023 1106   K 4.1 02/28/2023 1106   CL 105 02/28/2023 1106   CO2 29 02/28/2023 1106   GLUCOSE 98 02/28/2023 1106   BUN 17 02/28/2023 1106   CREATININE 0.56 02/28/2023 1106   CALCIUM 9.0 02/28/2023 1106   PROT 6.3 (L)  02/28/2023 1106   ALBUMIN 4.3 02/28/2023 1106   AST 11 (L) 02/28/2023 1106   ALT 16 02/28/2023 1106   ALKPHOS 53 02/28/2023 1106   BILITOT 1.0 02/28/2023 1106   GFRNONAA >60 02/28/2023 1106   GFRAA >90 01/01/2014 0521     ASSESSMENT & PLAN: Maureen Figueroa is a 73 y.o. female with    Right breast DCIS, intermediate grade, ER+/PR- and ALH -S/p right mastectomy on 08/03/20 by Dr. Vernetta.  She did not require postmastectomy radiation -Genetics were negative -Postmastectomy radiation was not recommended  -She began raloxifene  on 11/25/2020, Plan for total of 5 years -L mammo 04/2022 and MRI 10/2022 were benign. Incidental aneurysm  is being followed by vascular -Maureen Figueroa is clinically doing well.  Tolerating raloxifene  with hot flashes, weight gain, and possible right hip pain.  We discussed she can trial holding raloxifene  for 2-3 weeks to see if the hip pain improves.  If it does not, this is likely arthritis.  Will message me if she would like to try this, otherwise we will continue -Labs reviewed.  Exam is benign.  Overall no clinical concern for recurrence -Discussed the use of contrast-enhanced mammography to replace MRIs, she is interested.  Will proceed with CEM in 04/2023 -Follow-up in 1 year, or sooner if needed  R lung nodule -Found incidentally on CTA for thoracic aneurysm  -short term CT chest recommended, will discuss with vascular who is owning this  Osteopenia -She has history of osteoporosis and s/p 5 years of Fosamax, she now has osteopenia. -raloxifene  can help strengthen her bone.      PLAN: -Recent breast imaging, CTA, and today's labs reviewed -Continue breast cancer surveillance and Raloxifine -CT chest ~March/April to follow up R lung nodule, will discuss with vascular to determine who will order -Contrast enhanced L mammo in April -F/up in 1 year, or sooner if needed   All questions were answered. The patient knows to call the clinic with any problems,  questions or concerns. No barriers to learning were detected. I spent 20 minutes counseling the patient face to face. The total time spent in the appointment was 30 minutes and more than 50% was on counseling, review of test results, and coordination of care.   Andreal Vultaggio, NP-C 02/28/2023

## 2023-03-03 ENCOUNTER — Telehealth: Payer: Self-pay

## 2023-03-03 ENCOUNTER — Other Ambulatory Visit: Payer: Self-pay

## 2023-03-03 ENCOUNTER — Ambulatory Visit: Payer: Medicare PPO | Admitting: Plastic Surgery

## 2023-03-03 DIAGNOSIS — D0511 Intraductal carcinoma in situ of right breast: Secondary | ICD-10-CM

## 2023-03-03 NOTE — Telephone Encounter (Addendum)
 Called [facility] to find out which order to put in they told me to place an order for 3D mammogram bilateral and place in the notes that we need Contrast Einhanced Mammogram. I placed the order and faxed it to [facility]. Received fax confirmation of receipt.   ----- Message from Lacie K Burton sent at 02/28/2023 12:50 PM EST ----- Norleen could you please ask Horatio what the specific order for contrast-enhanced mammogram (CEM) is? This patient needs left side CEM in April.   Thanks Lacie

## 2023-03-03 NOTE — Telephone Encounter (Signed)
-----   Message from Lacie K Burton sent at 02/28/2023 12:50 PM EST ----- Autry Legions could you please ask Jodi Munroe what the specific order for contrast-enhanced mammogram (CEM) is? This patient needs left side CEM in April.   Thanks Lacie

## 2023-03-14 DIAGNOSIS — M7061 Trochanteric bursitis, right hip: Secondary | ICD-10-CM | POA: Diagnosis not present

## 2023-03-30 ENCOUNTER — Other Ambulatory Visit: Payer: Self-pay | Admitting: Surgery

## 2023-03-30 DIAGNOSIS — I7121 Aneurysm of the ascending aorta, without rupture: Secondary | ICD-10-CM

## 2023-04-14 ENCOUNTER — Ambulatory Visit: Payer: Medicare PPO | Admitting: Plastic Surgery

## 2023-05-08 ENCOUNTER — Ambulatory Visit
Admission: RE | Admit: 2023-05-08 | Discharge: 2023-05-08 | Disposition: A | Discharge status: Home / Self Care | Source: Ambulatory Visit | Attending: Surgery | Admitting: Surgery

## 2023-05-08 DIAGNOSIS — I7121 Aneurysm of the ascending aorta, without rupture: Secondary | ICD-10-CM

## 2023-05-08 DIAGNOSIS — I700 Atherosclerosis of aorta: Secondary | ICD-10-CM | POA: Diagnosis not present

## 2023-05-15 ENCOUNTER — Ambulatory Visit: Admitting: Surgical

## 2023-05-15 ENCOUNTER — Other Ambulatory Visit: Payer: Self-pay | Admitting: Internal Medicine

## 2023-05-15 VITALS — BP 155/80 | HR 80 | Resp 20 | Ht 65.0 in | Wt 164.0 lb

## 2023-05-15 DIAGNOSIS — I7121 Aneurysm of the ascending aorta, without rupture: Secondary | ICD-10-CM | POA: Diagnosis not present

## 2023-05-15 DIAGNOSIS — N281 Cyst of kidney, acquired: Secondary | ICD-10-CM

## 2023-05-15 NOTE — Progress Notes (Signed)
 [address]       [place] [address]             [phone]        Maureen Figueroa [id number] [birth year]      History of Present Illness: Patient is a 73 year old female surveillance for a ascending aortic aneurysm which was initially diagnosed for a follow-up of her breast cancer diagnosis measuring 4.1 cm.  She is asymptomatic in this regard.  She continues to have elevated blood pressure and is seeking further management with her primary in this regard.  She has kept a pressure diary which shows consistent elevation as it is on today's visit.  The scan done today shows a measurement of 3.9 cm and also a finding on the left kidney that will need further diagnostic evaluation.  She is pending hip replacement which she is hoping to have soon so she can live a more active lifestyle including exercise.       Current Outpatient Medications on File Prior to Visit  Medication Sig Dispense Refill   Ascorbic Acid (VITAMIN C  PO) Take 500 mg by mouth daily.      Calcium Carb-Cholecalciferol (CALCIUM + D3) 600-200 MG-UNIT TABS 1 tablet     carisoprodol  (SOMA ) 350 MG tablet 1 tablet as needed     Cholecalciferol (D3) 50 MCG (2000 UT) TABS      meloxicam (MOBIC) 15 MG tablet Take 1 tablet by mouth daily.     Multiple Vitamin (MULTI-VITAMIN PO) Take by mouth.     NONFORMULARY OR COMPOUNDED ITEM Vitamin E 200 u/ml vaginal suppositories.  Place one per vaginal at hs twice weekly. 24 each 3   raloxifene  (EVISTA ) 60 MG tablet TAKE 1 TABLET(60 MG) BY MOUTH DAILY 90 tablet 3   selenium sulfide (SELSUN) 2.5 % shampoo      tretinoin  microspheres (RETIN-A  MICRO) 0.1 % gel Apply 1 application topically at bedtime.      valACYclovir  (VALTREX ) 1000 MG tablet TAKE 1 TABLET BY MOUTH AT BEDTIME. TAKE 1 TABLET TWICE DAILY X 3 DAYS FOR GENITAL OUTBREAK AND 2 TABS EVERY 12 HOURS X 1 DAY FOR ORAL OUTBREAK 110 tablet 3   VITAMIN E EX Apply topically. suppository     No current  facility-administered medications on file prior to visit.   Past Medical History:  Diagnosis Date   Back injury 1994   back/neck injury    Cancer (HCC)    Breast cancer--right   Endometriosis    Family history of breast cancer    Family history of kidney cancer    Family history of lung cancer    Lyme disease, unspecified    followed by PCP   Osteoporosis    Pinched nerve in neck 12/2013   Postmenopausal bleeding 2016   uterine fibroid, on HRT   STD (sexually transmitted disease)    HSV   Urinary incontinence      Past Surgical History:  Procedure Laterality Date   BREAST SURGERY  07-23-08   benign Rt.breast biopsy--benign stromal fibrosis with calcifications:[facility]   CATARACT EXTRACTION, BILATERAL Bilateral 11/2016, 03/2017   EYE SURGERY Right    cyst removed x2    LASIK     OOPHORECTOMY  12/86   PELVIC LAPAROSCOPY  1986   endometriosis   TOTAL MASTECTOMY Right 08/03/2020   Procedure: RIGHT TOTAL MASTECTOMY;  Surgeon: Oza Blumenthal, MD;  Location: [facility];  Service: General;  Laterality: Right;  Social History   Occupational History   Occupation: Runner, broadcasting/film/video   Tobacco Use   Smoking status: Never   Smokeless tobacco: Never  Vaping Use   Vaping status: Never Used  Substance and Sexual Activity   Alcohol use: Yes    Alcohol/week: 14.0 standard drinks of alcohol    Types: 14 Standard drinks or equivalent per week    Comment: daily wine or alcohol since 2020   Drug use: No   Sexual activity: Yes    Partners: Male    Birth control/protection: Post-menopausal    Comment: HSV 2+     Allergies  Allergen Reactions   Metaxalone Other (See Comments)   Chocolate Swelling   Cocoa Swelling   Penicillins Other (See Comments) and Swelling    swelling swelling      BP (!) 155/80   Pulse 80   Resp 20   Ht 5\' 5"  (1.651 m)   Wt 164 lb (74.4 kg)   LMP 09/24/2005   SpO2 98% Comment: RA  BMI 27.29 kg/m   Physical Exam Constitutional:       Appearance: Normal appearance. She is not ill-appearing.  HENT:     Head: Normocephalic.  Cardiovascular:     Rate and Rhythm: Normal rate and regular rhythm.     Pulses: Normal pulses.     Heart sounds: No murmur heard. Pulmonary:     Effort: Pulmonary effort is normal.     Breath sounds: Normal breath sounds.  Abdominal:     Palpations: Abdomen is soft.     Tenderness: There is no abdominal tenderness.  Musculoskeletal:     Right lower leg: Edema present.     Left lower leg: Edema present.     Comments: Minor bilateral lower extremity edema  Skin:    Capillary Refill: Capillary refill takes less than 2 seconds.     Coloration: Skin is not jaundiced or pale.  Neurological:     General: No focal deficit present.     Mental Status: She is alert.  Psychiatric:        Mood and Affect: Mood normal.        Thought Content: Thought content normal.     CTA Results:  Narrative & Impression  CLINICAL DATA:  Ascending thoracic aortic aneurysm.   EXAM: CT CHEST WITHOUT CONTRAST   TECHNIQUE: Multidetector CT imaging of the chest was performed following the standard protocol without IV contrast.   RADIATION DOSE REDUCTION: This exam was performed according to the departmental dose-optimization program which includes automated exposure control, adjustment of the mA and/or kV according to patient size and/or use of iterative reconstruction technique.   COMPARISON:  01/12/2023   FINDINGS: Cardiovascular: The heart size is normal. No substantial pericardial effusion. Mild atherosclerotic calcification is noted in the wall of the thoracic aorta. Ascending thoracic aorta measures up to 3.9 cm diameter on this non gated study, stable.   Mediastinum/Nodes: No mediastinal lymphadenopathy. No evidence for gross hilar lymphadenopathy although assessment is limited by the lack of intravenous contrast on the current study. The esophagus has normal imaging features. There is no axillary  lymphadenopathy.   Lungs/Pleura: The lungs are clear without focal pneumonia, edema, pneumothorax or pleural effusion. 6 mm right perifissural nodule seen on 75/5. Left-sided perifissural nodule measures 3 mm on 68/5. No overtly suspicious pulmonary nodule or mass.   Upper Abdomen: 3.8 cm low-density lesion upper interpolar left kidney approaches water density but has internal calcification (images 153 in 154 series 2)  raising concern for internal architecture. Stable small hypodensity in the central liver suggesting a cyst. The visualized portion of the upper abdomen is otherwise unremarkable.   Musculoskeletal: No worrisome lytic or sclerotic osseous abnormality.   IMPRESSION: 1. Stable ascending thoracic aorta measuring up to 3.9 cm diameter on this non gated study. 2. 3.8 cm low-density lesion upper interpolar left kidney approaches water density but has internal calcification raising concern for internal architecture. This may be a complex cyst. Renal protocol CT or MRI with and without contrast recommended to further evaluate. 3. Small perifissural nodules measuring up to 6 mm. Given perifissural locations, these are almost assuredly benign. 4. Aortic atherosclerosis.     Electronically Signed   By: Donnal Fusi M.D.   On: 05/08/2023 11:06        A/P: Borderline ascending thoracic aortic this most recent readings between 1 and 4.1.  We will obtain a repeat scan in 2 years.  She knows to seek further medical management of her blood pressure with her primary.  She has also been in contact with her primary to seek further evaluation of the left kidney abnormality which may be a complex cyst but definitely needs further radiological evaluation.      Risk Modification: Needs better blood pressure control  Statin: Defer to primary  Smoking cessation instruction/counseling given: Non-smoker  Patient was counseled on importance of Blood Pressure Control.  Despite  Medical intervention if the patient notices persistently elevated blood pressure readings.  They are instructed to contact their Primary Care Physician  Please avoid use of Fluoroquinolones as this can potentially increase your risk of Aortic Rupture and/or Dissection  Patient educated on signs and symptoms of Aortic Dissection, handout also provided in AVS  Lindi Revering, PA-C 05/15/23

## 2023-05-15 NOTE — Patient Instructions (Signed)
 Seek further evaluation of the kidney finding on scan as you are planning to do through your primary care doctor.  Also seek further medical management of your blood pressure.

## 2023-05-19 DIAGNOSIS — D0511 Intraductal carcinoma in situ of right breast: Secondary | ICD-10-CM | POA: Diagnosis not present

## 2023-05-24 DIAGNOSIS — Z1231 Encounter for screening mammogram for malignant neoplasm of breast: Secondary | ICD-10-CM | POA: Diagnosis not present

## 2023-05-29 ENCOUNTER — Encounter: Payer: Self-pay | Admitting: Hematology

## 2023-06-03 ENCOUNTER — Ambulatory Visit: Payer: Self-pay | Admitting: Student

## 2023-06-06 ENCOUNTER — Ambulatory Visit
Admission: RE | Admit: 2023-06-06 | Discharge: 2023-06-06 | Discharge status: Home / Self Care | Source: Ambulatory Visit | Attending: Internal Medicine | Admitting: Internal Medicine

## 2023-06-06 DIAGNOSIS — N281 Cyst of kidney, acquired: Secondary | ICD-10-CM | POA: Diagnosis not present

## 2023-06-06 MED ORDER — GADOPICLENOL 0.5 MMOL/ML IV SOLN
8.0000 mL | Freq: Once | INTRAVENOUS | Status: AC | PRN
  Administered 2023-06-06: 8 mL via INTRAVENOUS

## 2023-06-06 NOTE — Patient Instructions (Signed)
 SURGICAL WAITING ROOM VISITATION  Patients having surgery or a procedure may have no more than 2 support people in the waiting area - these visitors may rotate.    Children under the age of 33 must have an adult with them who is not the patient.  Due to an increase in RSV and influenza rates and associated hospitalizations, children ages 71 and under may not visit patients in [facility] hospitals.  Visitors with respiratory illnesses are discouraged from visiting and should remain at home.  If the patient needs to stay at the hospital during part of their recovery, the visitor guidelines for inpatient rooms apply. Pre-op nurse will coordinate an appropriate time for 1 support person to accompany patient in pre-op.  This support person may not rotate.    Please refer to the [facility] website for the visitor guidelines for Inpatients (after your surgery is over and you are in a regular room).       Your procedure is scheduled on:  06/21/23    Report to [facility] Main Entrance    Report to admitting at   [phone]   Call this number if you have problems the morning of surgery [phone]   Do not eat food :After Midnight.   After Midnight you may have the following liquids until __ 0430____ AM DAY OF SURGERY  Water Non-Citrus Juices (without pulp, NO RED-Apple, White grape, White cranberry) Black Coffee (NO MILK/CREAM OR CREAMERS, sugar ok)  Clear Tea (NO MILK/CREAM OR CREAMERS, sugar ok) regular and decaf                             Plain Jell-O (NO RED)                                           Fruit ices (not with fruit pulp, NO RED)                                     Popsicles (NO RED)                                                               Sports drinks like Gatorade (NO RED)                    The day of surgery:  Drink ONE (1) Pre-Surgery Clear Ensure or G2 at 0430 AM ( have completed by )  the morning of surgery. Drink in one sitting. Do not sip.  This  drink was given to you during your hospital  pre-op appointment visit. Nothing else to drink after completing the  Pre-Surgery Clear Ensure or G2.          If you have questions, please contact your surgeon's office.      Oral Hygiene is also important to reduce your risk of infection.  Remember - BRUSH YOUR TEETH THE MORNING OF SURGERY WITH YOUR REGULAR TOOTHPASTE  DENTURES WILL BE REMOVED PRIOR TO SURGERY PLEASE DO NOT APPLY "Poly grip" OR ADHESIVES!!!   Do NOT smoke after Midnight   Stop all vitamins and herbal supplements 7 days before surgery.   Take these medicines the morning of surgery with A SIP OF WATER:  none  DO NOT TAKE ANY ORAL DIABETIC MEDICATIONS DAY OF YOUR SURGERY  Bring CPAP mask and tubing day of surgery.                              You may not have any metal on your body including hair pins, jewelry, and body piercing             Do not wear make-up, lotions, powders, perfumes/cologne, or deodorant  Do not wear nail polish including gel and S&S, artificial/acrylic nails, or any other type of covering on natural nails including finger and toenails. If you have artificial nails, gel coating, etc. that needs to be removed by a nail salon please have this removed prior to surgery or surgery may need to be canceled/ delayed if the surgeon/ anesthesia feels like they are unable to be safely monitored.   Do not shave  48 hours prior to surgery.               Men may shave face and neck.   Do not bring valuables to the hospital. [facility] IS NOT             RESPONSIBLE   FOR VALUABLES.   Contacts, glasses, dentures or bridgework may not be worn into surgery.   Bring small overnight bag day of surgery.   DO NOT BRING YOUR HOME MEDICATIONS TO THE HOSPITAL. PHARMACY WILL DISPENSE MEDICATIONS LISTED ON YOUR MEDICATION LIST TO YOU DURING YOUR ADMISSION IN THE HOSPITAL!    Patients discharged on the day of surgery will not be  allowed to drive home.  Someone NEEDS to stay with you for the first 24 hours after anesthesia.   Special Instructions: Bring a copy of your healthcare power of attorney and living will documents the day of surgery if you haven't scanned them before.              Please read over the following fact sheets you were given: IF YOU HAVE QUESTIONS ABOUT YOUR PRE-OP INSTRUCTIONS PLEASE CALL [phone]   If you received a COVID test during your pre-op visit  it is requested that you wear a mask when out in public, stay away from anyone that may not be feeling well and notify your surgeon if you develop symptoms. If you test positive for Covid or have been in contact with anyone that has tested positive in the last 10 days please notify you surgeon.      Pre-operative 5 CHG Bath Instructions   You can play a key role in reducing the risk of infection after surgery. Your skin needs to be as free of germs as possible. You can reduce the number of germs on your skin by washing with CHG (chlorhexidine  gluconate) soap before surgery. CHG is an antiseptic soap that kills germs and continues to kill germs even after washing.   DO NOT use if you have an allergy to chlorhexidine /CHG or antibacterial soaps. If your skin becomes reddened or irritated, stop using the CHG and notify one of our RNs at [phone].  Please shower with the CHG soap starting 4 days before surgery using the following schedule:     Please keep in mind the following:  DO NOT shave, including legs and underarms, starting the day of your first shower.   You may shave your face at any point before/day of surgery.  Place clean sheets on your bed the day you start using CHG soap. Use a clean washcloth (not used since being washed) for each shower. DO NOT sleep with pets once you start using the CHG.   CHG Shower Instructions:  If you choose to wash your hair and private area, wash first with your normal shampoo/soap.  After you use  shampoo/soap, rinse your hair and body thoroughly to remove shampoo/soap residue.  Turn the water OFF and apply about 3 tablespoons (45 ml) of CHG soap to a CLEAN washcloth.  Apply CHG soap ONLY FROM YOUR NECK DOWN TO YOUR TOES (washing for 3-5 minutes)  DO NOT use CHG soap on face, private areas, open wounds, or sores.  Pay special attention to the area where your surgery is being performed.  If you are having back surgery, having someone wash your back for you may be helpful. Wait 2 minutes after CHG soap is applied, then you may rinse off the CHG soap.  Pat dry with a clean towel  Put on clean clothes/pajamas   If you choose to wear lotion, please use ONLY the CHG-compatible lotions on the back of this paper.     Additional instructions for the day of surgery: DO NOT APPLY any lotions, deodorants, cologne, or perfumes.   Put on clean/comfortable clothes.  Brush your teeth.  Ask your nurse before applying any prescription medications to the skin.      CHG Compatible Lotions   Aveeno Moisturizing lotion  Cetaphil Moisturizing Cream  Cetaphil Moisturizing Lotion  Clairol Herbal Essence Moisturizing Lotion, Dry Skin  Clairol Herbal Essence Moisturizing Lotion, Extra Dry Skin  Clairol Herbal Essence Moisturizing Lotion, Normal Skin  Curel Age Defying Therapeutic Moisturizing Lotion with Alpha Hydroxy  Curel Extreme Care Body Lotion  Curel Soothing Hands Moisturizing Hand Lotion  Curel Therapeutic Moisturizing Cream, Fragrance-Free  Curel Therapeutic Moisturizing Lotion, Fragrance-Free  Curel Therapeutic Moisturizing Lotion, Original Formula  Eucerin Daily Replenishing Lotion  Eucerin Dry Skin Therapy Plus Alpha Hydroxy Crme  Eucerin Dry Skin Therapy Plus Alpha Hydroxy Lotion  Eucerin Original Crme  Eucerin Original Lotion  Eucerin Plus Crme Eucerin Plus Lotion  Eucerin TriLipid Replenishing Lotion  Keri Anti-Bacterial Hand Lotion  Keri Deep Conditioning Original Lotion Dry  Skin Formula Softly Scented  Keri Deep Conditioning Original Lotion, Fragrance Free Sensitive Skin Formula  Keri Lotion Fast Absorbing Fragrance Free Sensitive Skin Formula  Keri Lotion Fast Absorbing Softly Scented Dry Skin Formula  Keri Original Lotion  Keri Skin Renewal Lotion Keri Silky Smooth Lotion  Keri Silky Smooth Sensitive Skin Lotion  Nivea Body Creamy Conditioning Oil  Nivea Body Extra Enriched Teacher, adult education Moisturizing Lotion Nivea Crme  Nivea Skin Firming Lotion  NutraDerm 30 Skin Lotion  NutraDerm Skin Lotion  NutraDerm Therapeutic Skin Cream  NutraDerm Therapeutic Skin Lotion  ProShield Protective Hand Cream  Provon moisturizing lotion

## 2023-06-06 NOTE — Progress Notes (Addendum)
 Anesthesia Review:  PCP: Tressia Fry  Cardiologist :  none  Wayne Gold,PAC LOV 05/15/23 - aoritc aneurysm   PPM/ ICD: Device Orders: Rep Notified:  Chest x-ray : CT chest- 05/08/23  EKG : 06/12/2023  Echo : 2018  Stress test: Cardiac Cath :   Activity level: can do a flight of stairs without difficulty  Sleep Study/ CPAP : none  Fasting Blood Sugar :      / Checks Blood Sugar -- times a day:    Blood Thinner/ Instructions /Last Dose: ASA / Instructions/ Last Dose :    REcent increase in blood pressure med due to elevated blood pressure.  PT to call preop nurse back in regards to current dosage.

## 2023-06-12 ENCOUNTER — Encounter (HOSPITAL_COMMUNITY)
Admission: RE | Admit: 2023-06-12 | Discharge: 2023-06-12 | Disposition: A | Discharge status: Home / Self Care | Source: Ambulatory Visit | Attending: Orthopedic Surgery | Admitting: Orthopedic Surgery

## 2023-06-12 ENCOUNTER — Other Ambulatory Visit: Payer: Self-pay

## 2023-06-12 ENCOUNTER — Encounter (HOSPITAL_COMMUNITY): Payer: Self-pay

## 2023-06-12 VITALS — BP 133/77 | HR 62 | Temp 99.0°F | Resp 16 | Ht 65.5 in | Wt 160.0 lb

## 2023-06-12 DIAGNOSIS — R9431 Abnormal electrocardiogram [ECG] [EKG]: Secondary | ICD-10-CM | POA: Insufficient documentation

## 2023-06-12 DIAGNOSIS — M1611 Unilateral primary osteoarthritis, right hip: Secondary | ICD-10-CM | POA: Diagnosis not present

## 2023-06-12 DIAGNOSIS — Z853 Personal history of malignant neoplasm of breast: Secondary | ICD-10-CM | POA: Diagnosis not present

## 2023-06-12 DIAGNOSIS — Z01818 Encounter for other preprocedural examination: Secondary | ICD-10-CM | POA: Insufficient documentation

## 2023-06-12 DIAGNOSIS — I119 Hypertensive heart disease without heart failure: Secondary | ICD-10-CM | POA: Insufficient documentation

## 2023-06-12 HISTORY — DX: Pneumonia, unspecified organism: J18.9

## 2023-06-12 HISTORY — DX: Aortic aneurysm of unspecified site, without rupture: I71.9

## 2023-06-12 HISTORY — DX: Essential (primary) hypertension: I10

## 2023-06-12 HISTORY — DX: Unspecified osteoarthritis, unspecified site: M19.90

## 2023-06-12 LAB — BASIC METABOLIC PANEL WITH GFR
Anion gap: 8 (ref 5–15)
BUN: 19 mg/dL (ref 8–23)
CO2: 25 mmol/L (ref 22–32)
Calcium: 9.1 mg/dL (ref 8.9–10.3)
Chloride: 103 mmol/L (ref 98–111)
Creatinine, Ser: 0.42 mg/dL — ABNORMAL LOW (ref 0.44–1.00)
GFR, Estimated: >60 mL/min (ref >60)
Glucose, Bld: 96 mg/dL (ref 70–99)
Potassium: 4 mmol/L (ref 3.5–5.1)
Sodium: 136 mmol/L (ref 135–145)

## 2023-06-12 LAB — SURGICAL PCR SCREEN
MRSA, PCR: NEGATIVE
Staphylococcus aureus: NEGATIVE

## 2023-06-12 LAB — CBC
HCT: 41.7 % (ref 36.0–46.0)
Hemoglobin: 14 g/dL (ref 12.0–15.0)
MCH: 34.1 pg — ABNORMAL HIGH (ref 26.0–34.0)
MCHC: 33.6 g/dL (ref 30.0–36.0)
MCV: 101.7 fL — ABNORMAL HIGH (ref 80.0–100.0)
Platelets: 224 10*3/uL (ref 150–400)
RBC: 4.1 MIL/uL (ref 3.87–5.11)
RDW: 12.4 % (ref 11.5–15.5)
WBC: 4.8 10*3/uL (ref 4.0–10.5)
nRBC: 0 % (ref 0.0–0.2)

## 2023-06-13 NOTE — Progress Notes (Signed)
 Anesthesia Chart Review   Case: [id number] Date/Time: 06/21/23 0715   Procedure: ARTHROPLASTY, HIP, TOTAL, ANTERIOR APPROACH (Right: Hip)   Anesthesia type: Spinal   Pre-op diagnosis: Right hip osteoarthritis   Location: [facility] ROOM 07 / [facility]   Surgeons: Adonica Hoose, MD       DISCUSSION:73 y.o. never smoker with h/o HTN, aortic aneurysm, breast cancer, right hip OA scheduled for above procedure 06/21/23 with Dr. Adonica Hoose.   Pt follows with thoracic surgeon for surveillance of AAA.  Pt last seen 05/15/23.  Pt asymptomatic.  Recent imaging stable, ascending thoracic aorta measuring 3.9cm.  she will follow up with thoracic surgery in 2 years with repeat imaging.   VS: BP 133/77   Pulse 62   Temp 37.2 C (Oral)   Resp 16   Ht 5' 5.5" (1.664 m)   Wt 72.6 kg   LMP 09/24/2005   SpO2 98%   BMI 26.22 kg/m   PROVIDERS: Tena Feeling, MD is PCP    LABS: Labs reviewed: Acceptable for surgery. (all labs ordered are listed, but only abnormal results are displayed)  Labs Reviewed  BASIC METABOLIC PANEL WITH GFR - Abnormal; Notable for the following components:      Result Value   Creatinine, Ser 0.42 (*)    All other components within normal limits  CBC - Abnormal; Notable for the following components:   MCV 101.7 (*)    MCH 34.1 (*)    All other components within normal limits  SURGICAL PCR SCREEN  TYPE AND SCREEN     IMAGES: CT Chest 05/08/2023 IMPRESSION: 1. Stable ascending thoracic aorta measuring up to 3.9 cm diameter on this non gated study. 2. 3.8 cm low-density lesion upper interpolar left kidney approaches water density but has internal calcification raising concern for internal architecture. This may be a complex cyst. Renal protocol CT or MRI with and without contrast recommended to further evaluate. 3. Small perifissural nodules measuring up to 6 mm. Given perifissural locations, these are almost assuredly benign. 4. Aortic  atherosclerosis.  EKG:   CV:  Past Medical History:  Diagnosis Date   Aortic aneurysm (HCC)    Arthritis    Back injury 1994   back/neck injury    Cancer (HCC)    Breast cancer--right   Endometriosis    Family history of breast cancer    Family history of kidney cancer    Family history of lung cancer    Hypertension    Lyme disease, unspecified    followed by PCP   Osteoporosis    Pinched nerve in neck 12/2013   Pneumonia    Postmenopausal bleeding 2016   uterine fibroid, on HRT   STD (sexually transmitted disease)    HSV   Urinary incontinence     Past Surgical History:  Procedure Laterality Date   BREAST SURGERY  07-23-08   benign Rt.breast biopsy--benign stromal fibrosis with calcifications:[facility]   CATARACT EXTRACTION, BILATERAL Bilateral 11/2016, 03/2017   EYE SURGERY Right    cyst removed x2    LASIK     OOPHORECTOMY  12/86   PELVIC LAPAROSCOPY  1986   endometriosis   TOTAL MASTECTOMY Right 08/03/2020   Procedure: RIGHT TOTAL MASTECTOMY;  Surgeon: Oza Blumenthal, MD;  Location: [facility];  Service: General;  Laterality: Right;    MEDICATIONS:  acetaminophen  (TYLENOL ) 500 MG tablet   Ascorbic Acid (VITAMIN C ) 1000 MG tablet   Calcium Carb-Cholecalciferol (CALCIUM 600 + D PO)  carisoprodol  (SOMA ) 350 MG tablet   cholecalciferol (VITAMIN D3) 25 MCG (1000 UNIT) tablet   meloxicam (MOBIC) 15 MG tablet   Multiple Vitamin (MULTI-VITAMIN PO)   NONFORMULARY OR COMPOUNDED ITEM   raloxifene  (EVISTA ) 60 MG tablet   selenium sulfide (SELSUN) 2.5 % shampoo   SUPER B COMPLEX/C PO   tretinoin  microspheres (RETIN-A  MICRO) 0.1 % gel   valACYclovir  (VALTREX ) 1000 MG tablet   valsartan (DIOVAN) 80 MG tablet   No current facility-administered medications for this encounter.     Chick Cotton Ward, PA-C [facility]-Surgical Testing [phone]

## 2023-06-15 ENCOUNTER — Ambulatory Visit: Payer: Self-pay | Admitting: Student

## 2023-06-15 NOTE — H&P (Signed)
 TOTAL HIP ADMISSION H&P  Patient is admitted for right total hip arthroplasty.  Subjective:  Chief Complaint: right hip pain  HPI: Maureen Figueroa, 73 y.o. female, has a history of pain and functional disability in the right hip(s) due to arthritis and patient has failed non-surgical conservative treatments for greater than 12 weeks to include NSAID's and/or analgesics, flexibility and strengthening excercises, supervised PT with diminished ADL's post treatment, use of assistive devices, and activity modification.  Onset of symptoms was gradual starting 6 years ago with rapidlly worsening course since that time.The patient noted no past surgery on the right hip(s).  Patient currently rates pain in the right hip at 10 out of 10 with activity. Patient has night pain, worsening of pain with activity and weight bearing, trendelenberg gait, pain that interfers with activities of daily living, and pain with passive range of motion. Patient has evidence of subchondral cysts, subchondral sclerosis, periarticular osteophytes, and joint space narrowing by imaging studies. This condition presents safety issues increasing the risk of falls.  There is no current active infection.  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   Past Medical History:  Diagnosis Date   Aortic aneurysm (HCC)    Arthritis    Back injury 1994   back/neck injury    Cancer (HCC)    Breast  cancer--right   Endometriosis    Family history of breast cancer    Family history of kidney cancer    Family history of lung cancer    Hypertension    Lyme disease, unspecified    followed by PCP   Osteoporosis    Pinched nerve in neck 12/2013   Pneumonia    Postmenopausal bleeding 2016   uterine fibroid, on HRT   STD (sexually transmitted disease)    HSV   Urinary incontinence     Past Surgical History:  Procedure Laterality Date   BREAST SURGERY  07-23-08   benign Rt.breast biopsy--benign stromal fibrosis with calcifications:[facility]   CATARACT EXTRACTION, BILATERAL Bilateral 11/2016, 03/2017   EYE SURGERY Right    cyst removed x2    LASIK     OOPHORECTOMY  12/86   PELVIC LAPAROSCOPY  1986   endometriosis   TOTAL MASTECTOMY Right 08/03/2020   Procedure: RIGHT TOTAL MASTECTOMY;  Surgeon: Oza Blumenthal, MD;  Location:  SURGERY CENTER;  Service: General;  Laterality: Right;    Current Outpatient Medications  Medication Sig Dispense Refill Last Dose/Taking   acetaminophen  (TYLENOL ) 500 MG tablet Take 1,000 mg by mouth every 6 (six) hours as needed for moderate pain (pain score 4-6) or headache.      Ascorbic Acid (VITAMIN C ) 1000 MG tablet Take 2,000 mg by mouth once a week.      Calcium Carb-Cholecalciferol (CALCIUM 600 + D PO) Take 1 tablet by mouth once a week.      carisoprodol  (SOMA ) 350 MG tablet Take 350 mg by mouth daily as needed  for muscle spasms.      cholecalciferol (VITAMIN D3) 25 MCG (1000 UNIT) tablet Take 2,000-3,000 Units by mouth once a week.      meloxicam (MOBIC) 15 MG tablet Take 1 tablet by mouth daily.      Multiple Vitamin (MULTI-VITAMIN PO) Take 1 tablet by mouth once a week.      NONFORMULARY OR COMPOUNDED ITEM Vitamin E 200 u/ml vaginal suppositories.  Place one per vaginal at hs twice weekly. 24 each 3    raloxifene  (EVISTA ) 60 MG tablet TAKE 1 TABLET(60 MG) BY MOUTH DAILY 90 tablet 3    selenium sulfide (SELSUN) 2.5 % shampoo Apply 1  Application topically daily as needed for itching.      SUPER B COMPLEX/C PO Take 1 tablet by mouth once a week.      tretinoin  microspheres (RETIN-A  MICRO) 0.1 % gel Apply 1 application  topically at bedtime as needed (acne).      valACYclovir  (VALTREX ) 1000 MG tablet TAKE 1 TABLET BY MOUTH AT BEDTIME. TAKE 1 TABLET TWICE DAILY X 3 DAYS FOR GENITAL OUTBREAK AND 2 TABS EVERY 12 HOURS X 1 DAY FOR ORAL OUTBREAK 110 tablet 3    valsartan (DIOVAN) 80 MG tablet Take 160 mg by mouth daily. Pt takes 80 mg in am and 80 mg in pm      No current facility-administered medications for this visit.   Allergies  Allergen Reactions   Metaxalone Other (See Comments)    Dizziness / disoriented    Chocolate Swelling   Cocoa Swelling   Penicillins Swelling    Social History   Tobacco Use   Smoking status: Never   Smokeless tobacco: Never  Substance Use Topics   Alcohol use: Yes    Alcohol/week: 14.0 standard drinks of alcohol    Types: 14 Standard drinks or equivalent per week    Comment: 2 glasses per day    Family History  Problem Relation Age of Onset   Hypertension Mother    COPD Mother    Hyperlipidemia Mother    Kidney cancer Father 58       dx late 25s   Breast cancer Sister 33   Pancreatic cancer Maternal Aunt        dx 32s (DDT exposure)   Cancer Maternal Aunt        gastrointestinal dx 88s (DDT exposure)   Prostate cancer Maternal Uncle        (DDT exposure)   Osteoporosis Maternal Grandfather    Osteoporosis Paternal Grandmother    Breast cancer Paternal Grandmother 57       mastectomy   Lung cancer Paternal Grandfather        dx late 31s, smoker     Review of Systems  Musculoskeletal:  Positive for arthralgias and gait problem.  All other systems reviewed and are negative.   Objective:  Physical Exam Constitutional:      Appearance: Normal appearance.  HENT:     Head: Normocephalic and atraumatic.     Nose: Nose normal.     Mouth/Throat:     Mouth: Mucous  membranes are moist.     Pharynx: Oropharynx is clear.  Eyes:     Conjunctiva/sclera: Conjunctivae normal.  Cardiovascular:     Rate and Rhythm: Normal rate and regular rhythm.     Pulses: Normal pulses.     Heart sounds: Normal heart sounds.  Pulmonary:     Effort: Pulmonary effort is normal.     Breath  sounds: Normal breath sounds.  Abdominal:     General: Abdomen is flat.     Palpations: Abdomen is soft.  Genitourinary:    Comments: deferred Musculoskeletal:     Cervical back: Normal range of motion and neck supple.     Comments: Examination of the right hip reveals no skin wounds or lesions. She has trochanteric tenderness to palpation. She has restricted range of motion of the right hip. Pain with terminal flexion and rotation. Pain in the position of impingement. Positive Stinchfield. Positive Ober. Abductor strength is 4/5.  Distally, there is no focal motor or sensory deficit. She is palpable pedal pulses.  Skin:    General: Skin is warm and dry.     Capillary Refill: Capillary refill takes less than 2 seconds.  Neurological:     General: No focal deficit present.     Mental Status: She is alert and oriented to person, place, and time.  Psychiatric:        Mood and Affect: Mood normal.        Behavior: Behavior normal.        Thought Content: Thought content normal.        Judgment: Judgment normal.     Vital signs in last 24 hours: @VSRANGES @  Labs:   Estimated body mass index is 26.22 kg/m as calculated from the following:   Height as of 06/12/23: 5' 5.5" (1.664 m).   Weight as of 06/12/23: 72.6 kg.   Imaging Review Plain radiographs demonstrate severe degenerative joint disease of the right hip(s). The bone quality appears to be adequate for age and reported activity level.      Assessment/Plan:  End stage arthritis, right hip(s)  The patient history, physical examination, clinical judgement of the provider and imaging studies are consistent with end  stage degenerative joint disease of the right hip(s) and total hip arthroplasty is deemed medically necessary. The treatment options including medical management, injection therapy, arthroscopy and arthroplasty were discussed at length. The risks and benefits of total hip arthroplasty were presented and reviewed. The risks due to aseptic loosening, infection, stiffness, dislocation/subluxation,  thromboembolic complications and other imponderables were discussed.  The patient acknowledged the explanation, agreed to proceed with the plan and consent was signed. Patient is being admitted for inpatient treatment for surgery, pain control, PT, OT, prophylactic antibiotics, VTE prophylaxis, progressive ambulation and ADL's and discharge planning.The patient is planning to be discharged home with HEP.  Therapy Plans: HEP. Disposition: Home with daughter. Might not be able to stay with her until day after surgery.  Planned DVT Prophylaxis: aspirin  81mg  BID DME needed: walker.  PCP: Cleared.  TXA: IV Allergies:  - Cocoa - swelling hands and feet.  - Metaxalone - Dizziness - Penicillin - swelling? Childhood.  Anesthesia Concerns: None.  BMI: 26.9 Last HgbA1c: Not diabetic.  Other: - Enlarged Aorta, stable. - MRI abdomen tomorrow, small lesion seen on her kidney.  - Hydrocodone, zofran .  - 06/12/23: K+ 4.0, Cr. 0.42, Hgb 14.0.       Patient's anticipated LOS is less than 2 midnights, meeting these requirements: - Younger than 67 - Lives within 1 hour of care - Has a competent adult at home to recover with post-op recover - NO history of  - Chronic pain requiring opiods  - Diabetes  - Coronary Artery Disease  - Heart failure  - Heart attack  - Stroke  - DVT/VTE  - Cardiac arrhythmia  - Respiratory Failure/COPD  - Renal failure  -  Anemia  - Advanced Liver disease

## 2023-06-15 NOTE — H&P (View-Only) (Signed)
 TOTAL HIP ADMISSION H&P  Patient is admitted for right total hip arthroplasty.  Subjective:  Chief Complaint: right hip pain  HPI: Maureen Figueroa, 73 y.o. female, has a history of pain and functional disability in the right hip(s) due to arthritis and patient has failed non-surgical conservative treatments for greater than 12 weeks to include NSAID's and/or analgesics, flexibility and strengthening excercises, supervised PT with diminished ADL's post treatment, use of assistive devices, and activity modification.  Onset of symptoms was gradual starting 6 years ago with rapidlly worsening course since that time.The patient noted no past surgery on the right hip(s).  Patient currently rates pain in the right hip at 10 out of 10 with activity. Patient has night pain, worsening of pain with activity and weight bearing, trendelenberg gait, pain that interfers with activities of daily living, and pain with passive range of motion. Patient has evidence of subchondral cysts, subchondral sclerosis, periarticular osteophytes, and joint space narrowing by imaging studies. This condition presents safety issues increasing the risk of falls.  There is no current active infection.  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   Past Medical History:  Diagnosis Date   Aortic aneurysm (HCC)    Arthritis    Back injury 1994   back/neck injury    Cancer (HCC)    Breast  cancer--right   Endometriosis    Family history of breast cancer    Family history of kidney cancer    Family history of lung cancer    Hypertension    Lyme disease, unspecified    followed by PCP   Osteoporosis    Pinched nerve in neck 12/2013   Pneumonia    Postmenopausal bleeding 2016   uterine fibroid, on HRT   STD (sexually transmitted disease)    HSV   Urinary incontinence     Past Surgical History:  Procedure Laterality Date   BREAST SURGERY  07-23-08   benign Rt.breast biopsy--benign stromal fibrosis with calcifications:[facility]   CATARACT EXTRACTION, BILATERAL Bilateral 11/2016, 03/2017   EYE SURGERY Right    cyst removed x2    LASIK     OOPHORECTOMY  12/86   PELVIC LAPAROSCOPY  1986   endometriosis   TOTAL MASTECTOMY Right 08/03/2020   Procedure: RIGHT TOTAL MASTECTOMY;  Surgeon: Oza Blumenthal, MD;  Location:  [facility];  Service: General;  Laterality: Right;    Current Outpatient Medications  Medication Sig Dispense Refill Last Dose/Taking   acetaminophen  (TYLENOL ) 500 MG tablet Take 1,000 mg by mouth every 6 (six) hours as needed for moderate pain (pain score 4-6) or headache.      Ascorbic Acid (VITAMIN C ) 1000 MG tablet Take 2,000 mg by mouth once a week.      Calcium Carb-Cholecalciferol (CALCIUM 600 + D PO) Take 1 tablet by mouth once a week.      carisoprodol  (SOMA ) 350 MG tablet Take 350 mg by mouth daily as needed  for muscle spasms.      cholecalciferol (VITAMIN D3) 25 MCG (1000 UNIT) tablet Take 2,000-3,000 Units by mouth once a week.      meloxicam (MOBIC) 15 MG tablet Take 1 tablet by mouth daily.      Multiple Vitamin (MULTI-VITAMIN PO) Take 1 tablet by mouth once a week.      NONFORMULARY OR COMPOUNDED ITEM Vitamin E 200 u/ml vaginal suppositories.  Place one per vaginal at hs twice weekly. 24 each 3    raloxifene  (EVISTA ) 60 MG tablet TAKE 1 TABLET(60 MG) BY MOUTH DAILY 90 tablet 3    selenium sulfide (SELSUN) 2.5 % shampoo Apply 1  Application topically daily as needed for itching.      SUPER B COMPLEX/C PO Take 1 tablet by mouth once a week.      tretinoin  microspheres (RETIN-A  MICRO) 0.1 % gel Apply 1 application  topically at bedtime as needed (acne).      valACYclovir  (VALTREX ) 1000 MG tablet TAKE 1 TABLET BY MOUTH AT BEDTIME. TAKE 1 TABLET TWICE DAILY X 3 DAYS FOR GENITAL OUTBREAK AND 2 TABS EVERY 12 HOURS X 1 DAY FOR ORAL OUTBREAK 110 tablet 3    valsartan (DIOVAN) 80 MG tablet Take 160 mg by mouth daily. Pt takes 80 mg in am and 80 mg in pm      No current facility-administered medications for this visit.   Allergies  Allergen Reactions   Metaxalone Other (See Comments)    Dizziness / disoriented    Chocolate Swelling   Cocoa Swelling   Penicillins Swelling    Social History   Tobacco Use   Smoking status: Never   Smokeless tobacco: Never  Substance Use Topics   Alcohol use: Yes    Alcohol/week: 14.0 standard drinks of alcohol    Types: 14 Standard drinks or equivalent per week    Comment: 2 glasses per day    Family History  Problem Relation Age of Onset   Hypertension Mother    COPD Mother    Hyperlipidemia Mother    Kidney cancer Father 58       dx late 25s   Breast cancer Sister 33   Pancreatic cancer Maternal Aunt        dx 32s (DDT exposure)   Cancer Maternal Aunt        gastrointestinal dx 88s (DDT exposure)   Prostate cancer Maternal Uncle        (DDT exposure)   Osteoporosis Maternal Grandfather    Osteoporosis Paternal Grandmother    Breast cancer Paternal Grandmother 57       mastectomy   Lung cancer Paternal Grandfather        dx late 31s, smoker     Review of Systems  Musculoskeletal:  Positive for arthralgias and gait problem.  All other systems reviewed and are negative.   Objective:  Physical Exam Constitutional:      Appearance: Normal appearance.  HENT:     Head: Normocephalic and atraumatic.     Nose: Nose normal.     Mouth/Throat:     Mouth: Mucous  membranes are moist.     Pharynx: Oropharynx is clear.  Eyes:     Conjunctiva/sclera: Conjunctivae normal.  Cardiovascular:     Rate and Rhythm: Normal rate and regular rhythm.     Pulses: Normal pulses.     Heart sounds: Normal heart sounds.  Pulmonary:     Effort: Pulmonary effort is normal.     Breath  sounds: Normal breath sounds.  Abdominal:     General: Abdomen is flat.     Palpations: Abdomen is soft.  Genitourinary:    Comments: deferred Musculoskeletal:     Cervical back: Normal range of motion and neck supple.     Comments: Examination of the right hip reveals no skin wounds or lesions. She has trochanteric tenderness to palpation. She has restricted range of motion of the right hip. Pain with terminal flexion and rotation. Pain in the position of impingement. Positive Stinchfield. Positive Ober. Abductor strength is 4/5.  Distally, there is no focal motor or sensory deficit. She is palpable pedal pulses.  Skin:    General: Skin is warm and dry.     Capillary Refill: Capillary refill takes less than 2 seconds.  Neurological:     General: No focal deficit present.     Mental Status: She is alert and oriented to person, place, and time.  Psychiatric:        Mood and Affect: Mood normal.        Behavior: Behavior normal.        Thought Content: Thought content normal.        Judgment: Judgment normal.     Vital signs in last 24 hours: @VSRANGES @  Labs:   Estimated body mass index is 26.22 kg/m as calculated from the following:   Height as of 06/12/23: 5' 5.5" (1.664 m).   Weight as of 06/12/23: 72.6 kg.   Imaging Review Plain radiographs demonstrate severe degenerative joint disease of the right hip(s). The bone quality appears to be adequate for age and reported activity level.      Assessment/Plan:  End stage arthritis, right hip(s)  The patient history, physical examination, clinical judgement of the provider and imaging studies are consistent with end  stage degenerative joint disease of the right hip(s) and total hip arthroplasty is deemed medically necessary. The treatment options including medical management, injection therapy, arthroscopy and arthroplasty were discussed at length. The risks and benefits of total hip arthroplasty were presented and reviewed. The risks due to aseptic loosening, infection, stiffness, dislocation/subluxation,  thromboembolic complications and other imponderables were discussed.  The patient acknowledged the explanation, agreed to proceed with the plan and consent was signed. Patient is being admitted for inpatient treatment for surgery, pain control, PT, OT, prophylactic antibiotics, VTE prophylaxis, progressive ambulation and ADL's and discharge planning.The patient is planning to be discharged home with HEP.  Therapy Plans: HEP. Disposition: Home with daughter. Might not be able to stay with her until day after surgery.  Planned DVT Prophylaxis: aspirin  81mg  BID DME needed: walker.  PCP: Cleared.  TXA: IV Allergies:  - Cocoa - swelling hands and feet.  - Metaxalone - Dizziness - Penicillin - swelling? Childhood.  Anesthesia Concerns: None.  BMI: 26.9 Last HgbA1c: Not diabetic.  Other: - Enlarged Aorta, stable. - MRI abdomen tomorrow, small lesion seen on her kidney.  - Hydrocodone, zofran .  - 06/12/23: K+ 4.0, Cr. 0.42, Hgb 14.0.       Patient's anticipated LOS is less than 2 midnights, meeting these requirements: - Younger than 67 - Lives within 1 hour of care - Has a competent adult at home to recover with post-op recover - NO history of  - Chronic pain requiring opiods  - Diabetes  - Coronary Artery Disease  - Heart failure  - Heart attack  - Stroke  - DVT/VTE  - Cardiac arrhythmia  - Respiratory Failure/COPD  - Renal failure  -  Anemia  - Advanced Liver disease

## 2023-06-16 DIAGNOSIS — I10 Essential (primary) hypertension: Secondary | ICD-10-CM | POA: Diagnosis not present

## 2023-06-20 NOTE — Anesthesia Preprocedure Evaluation (Signed)
 Anesthesia Evaluation  Patient identified by MRN, date of birth, ID band Patient awake    Reviewed: Allergy & Precautions, NPO status , Patient's Chart, lab work & pertinent test results  History of Anesthesia Complications Negative for: history of anesthetic complications  Airway Mallampati: I  TM Distance: >3 FB Neck ROM: Full    Dental  (+) Implants,    Pulmonary neg pulmonary ROS   Pulmonary exam normal breath sounds clear to auscultation       Cardiovascular hypertension, (-) angina (-) Past MI, (-) Cardiac Stents and (-) CABG (-) dysrhythmias  Rhythm:Regular Rate:Normal  3.9 cm ascending aortic aneurysm  TTE 07/11/2016: Study Conclusions   - Left ventricle: The cavity size was normal. Systolic function was    normal. The estimated ejection fraction was in the range of 55%    to 60%. Wall motion was normal; there were no regional wall    motion abnormalities. Left ventricular diastolic function    parameters were normal.  - Aortic valve: Trileaflet; normal thickness, mildly calcified    leaflets.  - Mitral valve: There was trivial regurgitation.  - Tricuspid valve: There was trivial regurgitation.     Neuro/Psych neg Seizures Pinched nerve in neck    GI/Hepatic negative GI ROS, Neg liver ROS,,,  Endo/Other  negative endocrine ROS    Renal/GU Renal disease (cysts) Bladder dysfunction      Musculoskeletal  (+) Arthritis ,  Osteoporosis    Abdominal   Peds  Hematology negative hematology ROS (+) Lab Results      Component                Value               Date                      WBC                      4.8                 06/12/2023                HGB                      14.0                06/12/2023                HCT                      41.7                06/12/2023                MCV                      101.7 (H)           06/12/2023                PLT                      224                  06/12/2023              Anesthesia Other Findings Lyme disease  Reproductive/Obstetrics H/o right breast cancer, endometriosis  Anesthesia Physical Anesthesia Plan  ASA: 2  Anesthesia Plan: MAC and Spinal   Post-op Pain Management: Tylenol  PO (pre-op)*   Induction: Intravenous  PONV Risk Score and Plan: 2 and Ondansetron , Dexamethasone , Propofol  infusion, TIVA and Treatment may vary due to age or medical condition  Airway Management Planned: Natural Airway and Simple Face Mask  Additional Equipment:   Intra-op Plan:   Post-operative Plan:   Informed Consent: I have reviewed the patients History and Physical, chart, labs and discussed the procedure including the risks, benefits and alternatives for the proposed anesthesia with the patient or authorized representative who has indicated his/her understanding and acceptance.     Dental advisory given  Plan Discussed with: Anesthesiologist and CRNA  Anesthesia Plan Comments: (I have discussed risks of neuraxial anesthesia including but not limited to infection, bleeding, nerve injury, back pain, headache, seizures, and failure of block. Patient denies bleeding disorders and is not currently anticoagulated. Labs have been reviewed. Risks and benefits discussed. All patient's questions answered.  )        Anesthesia Quick Evaluation

## 2023-06-21 ENCOUNTER — Encounter (HOSPITAL_COMMUNITY)
Admission: RE | Disposition: A | Discharge status: Home / Self Care | Payer: Self-pay | Source: Home / Self Care | Attending: Orthopedic Surgery

## 2023-06-21 ENCOUNTER — Ambulatory Visit (HOSPITAL_COMMUNITY)

## 2023-06-21 ENCOUNTER — Other Ambulatory Visit: Payer: Self-pay

## 2023-06-21 ENCOUNTER — Ambulatory Visit (HOSPITAL_COMMUNITY): Payer: Self-pay | Admitting: Certified Registered Nurse Anesthetist

## 2023-06-21 ENCOUNTER — Ambulatory Visit (HOSPITAL_COMMUNITY): Payer: Self-pay | Admitting: Physician Assistant

## 2023-06-21 ENCOUNTER — Ambulatory Visit (HOSPITAL_COMMUNITY)
Admission: RE | Admit: 2023-06-21 | Discharge: 2023-06-22 | Disposition: A | Discharge status: Home / Self Care | Payer: Medicare PPO | Attending: Orthopedic Surgery | Admitting: Orthopedic Surgery

## 2023-06-21 ENCOUNTER — Encounter (HOSPITAL_COMMUNITY): Payer: Self-pay | Admitting: Orthopedic Surgery

## 2023-06-21 DIAGNOSIS — Z853 Personal history of malignant neoplasm of breast: Secondary | ICD-10-CM | POA: Diagnosis not present

## 2023-06-21 DIAGNOSIS — I10 Essential (primary) hypertension: Secondary | ICD-10-CM | POA: Insufficient documentation

## 2023-06-21 DIAGNOSIS — Z471 Aftercare following joint replacement surgery: Secondary | ICD-10-CM | POA: Diagnosis not present

## 2023-06-21 DIAGNOSIS — M1611 Unilateral primary osteoarthritis, right hip: Secondary | ICD-10-CM

## 2023-06-21 DIAGNOSIS — M810 Age-related osteoporosis without current pathological fracture: Secondary | ICD-10-CM | POA: Diagnosis not present

## 2023-06-21 DIAGNOSIS — D62 Acute posthemorrhagic anemia: Secondary | ICD-10-CM | POA: Insufficient documentation

## 2023-06-21 DIAGNOSIS — I7121 Aneurysm of the ascending aorta, without rupture: Secondary | ICD-10-CM | POA: Insufficient documentation

## 2023-06-21 DIAGNOSIS — Z96641 Presence of right artificial hip joint: Secondary | ICD-10-CM | POA: Diagnosis present

## 2023-06-21 DIAGNOSIS — Z01818 Encounter for other preprocedural examination: Secondary | ICD-10-CM

## 2023-06-21 HISTORY — PX: TOTAL HIP ARTHROPLASTY: SHX124

## 2023-06-21 LAB — TYPE AND SCREEN
ABO/RH(D): A POS
Antibody Screen: NEGATIVE

## 2023-06-21 LAB — ABO/RH: ABO/RH(D): A POS

## 2023-06-21 SURGERY — ARTHROPLASTY, HIP, TOTAL, ANTERIOR APPROACH
Anesthesia: Monitor Anesthesia Care | Site: Hip | Laterality: Right

## 2023-06-21 MED ORDER — ACETAMINOPHEN 500 MG PO TABS
1000.0000 mg | ORAL_TABLET | Freq: Once | ORAL | Status: AC
  Administered 2023-06-21: 1000 mg via ORAL
  Filled 2023-06-21: qty 2

## 2023-06-21 MED ORDER — KETOROLAC TROMETHAMINE 30 MG/ML IJ SOLN
INTRAMUSCULAR | Status: AC
  Filled 2023-06-21: qty 1

## 2023-06-21 MED ORDER — PROPOFOL 1000 MG/100ML IV EMUL
INTRAVENOUS | Status: AC
  Filled 2023-06-21: qty 100

## 2023-06-21 MED ORDER — ISOPROPYL ALCOHOL 70 % SOLN
Status: DC | PRN
Start: 2023-06-21 — End: 2023-06-21
  Administered 2023-06-21: 1 via TOPICAL

## 2023-06-21 MED ORDER — BUPIVACAINE IN DEXTROSE 0.75-8.25 % IT SOLN
INTRATHECAL | Status: DC | PRN
  Administered 2023-06-21: 1.8 mL via INTRATHECAL

## 2023-06-21 MED ORDER — SODIUM CHLORIDE (PF) 0.9 % IJ SOLN
INTRAMUSCULAR | Status: AC
  Filled 2023-06-21: qty 30

## 2023-06-21 MED ORDER — OXYCODONE HCL 5 MG PO TABS
5.0000 mg | ORAL_TABLET | Freq: Once | ORAL | Status: AC | PRN
  Administered 2023-06-21: 5 mg via ORAL

## 2023-06-21 MED ORDER — MENTHOL 3 MG MT LOZG: 1.0000 | LOZENGE | OROMUCOSAL | Status: DC | PRN

## 2023-06-21 MED ORDER — HYDROMORPHONE HCL 1 MG/ML IJ SOLN
INTRAMUSCULAR | Status: AC
  Filled 2023-06-21: qty 1

## 2023-06-21 MED ORDER — ASPIRIN 81 MG PO CHEW
81.0000 mg | CHEWABLE_TABLET | Freq: Two times a day (BID) | ORAL | Status: DC
  Administered 2023-06-21 – 2023-06-22 (×2): 81 mg via ORAL
  Filled 2023-06-21 (×2): qty 1

## 2023-06-21 MED ORDER — SODIUM CHLORIDE (PF) 0.9 % IJ SOLN
INTRAMUSCULAR | Status: DC | PRN
  Administered 2023-06-21: 61 mL

## 2023-06-21 MED ORDER — CHLORHEXIDINE GLUCONATE 0.12 % MT SOLN
15.0000 mL | Freq: Once | OROMUCOSAL | Status: AC
  Administered 2023-06-21: 15 mL via OROMUCOSAL

## 2023-06-21 MED ORDER — HYDROCODONE-ACETAMINOPHEN 7.5-325 MG PO TABS: 1.0000 | ORAL_TABLET | ORAL | Status: DC | PRN

## 2023-06-21 MED ORDER — HYDROMORPHONE HCL 1 MG/ML IJ SOLN
0.2500 mg | INTRAMUSCULAR | Status: DC | PRN
  Administered 2023-06-21 (×4): 0.5 mg via INTRAVENOUS

## 2023-06-21 MED ORDER — METOCLOPRAMIDE HCL 5 MG/ML IJ SOLN
5.0000 mg | Freq: Three times a day (TID) | INTRAMUSCULAR | Status: DC | PRN
  Administered 2023-06-21: 10 mg via INTRAVENOUS
  Filled 2023-06-21: qty 2

## 2023-06-21 MED ORDER — SENNA 8.6 MG PO TABS
1.0000 | ORAL_TABLET | Freq: Two times a day (BID) | ORAL | Status: DC
  Administered 2023-06-21 – 2023-06-22 (×2): 8.6 mg via ORAL
  Filled 2023-06-21 (×2): qty 1

## 2023-06-21 MED ORDER — BUPIVACAINE-EPINEPHRINE (PF) 0.25% -1:200000 IJ SOLN
INTRAMUSCULAR | Status: AC
  Filled 2023-06-21: qty 30

## 2023-06-21 MED ORDER — ONDANSETRON HCL 4 MG/2ML IJ SOLN
INTRAMUSCULAR | Status: DC | PRN
  Administered 2023-06-21: 4 mg via INTRAVENOUS

## 2023-06-21 MED ORDER — TRANEXAMIC ACID-NACL 1000-0.7 MG/100ML-% IV SOLN
1000.0000 mg | INTRAVENOUS | Status: AC
  Administered 2023-06-21: 1000 mg via INTRAVENOUS
  Filled 2023-06-21: qty 100

## 2023-06-21 MED ORDER — ORAL CARE MOUTH RINSE: 15.0000 mL | Freq: Once | OROMUCOSAL | Status: AC

## 2023-06-21 MED ORDER — ALUM & MAG HYDROXIDE-SIMETH 200-200-20 MG/5ML PO SUSP
30.0000 mL | ORAL | Status: DC | PRN
Start: 2023-06-21 — End: 2023-06-22

## 2023-06-21 MED ORDER — PHENOL 1.4 % MT LIQD
1.0000 | OROMUCOSAL | Status: DC | PRN
Start: 2023-06-21 — End: 2023-06-22

## 2023-06-21 MED ORDER — METHOCARBAMOL 500 MG PO TABS
500.0000 mg | ORAL_TABLET | Freq: Four times a day (QID) | ORAL | Status: DC | PRN
  Administered 2023-06-21: 500 mg via ORAL

## 2023-06-21 MED ORDER — AMISULPRIDE (ANTIEMETIC) 5 MG/2ML IV SOLN: 10.0000 mg | Freq: Once | INTRAVENOUS | Status: DC | PRN

## 2023-06-21 MED ORDER — HYDROCODONE-ACETAMINOPHEN 5-325 MG PO TABS
1.0000 | ORAL_TABLET | ORAL | Status: DC | PRN
  Administered 2023-06-21 – 2023-06-22 (×5): 2 via ORAL
  Filled 2023-06-21 (×6): qty 2

## 2023-06-21 MED ORDER — SODIUM CHLORIDE 0.9 % IR SOLN
Status: DC | PRN
  Administered 2023-06-21: 1000 mL

## 2023-06-21 MED ORDER — PANTOPRAZOLE SODIUM 40 MG PO TBEC
40.0000 mg | DELAYED_RELEASE_TABLET | Freq: Every day | ORAL | Status: DC
  Administered 2023-06-21 – 2023-06-22 (×2): 40 mg via ORAL
  Filled 2023-06-21 (×2): qty 1

## 2023-06-21 MED ORDER — BISACODYL 10 MG RE SUPP: 10.0000 mg | Freq: Every day | RECTAL | Status: DC | PRN

## 2023-06-21 MED ORDER — OXYCODONE HCL 5 MG PO TABS
ORAL_TABLET | ORAL | Status: AC
  Filled 2023-06-21: qty 1

## 2023-06-21 MED ORDER — POVIDONE-IODINE 10 % EX SWAB: 2.0000 | Freq: Once | CUTANEOUS | Status: AC

## 2023-06-21 MED ORDER — LACTATED RINGERS IV SOLN: INTRAVENOUS | Status: DC

## 2023-06-21 MED ORDER — CEFAZOLIN SODIUM-DEXTROSE 2-4 GM/100ML-% IV SOLN
2.0000 g | Freq: Four times a day (QID) | INTRAVENOUS | Status: AC
  Administered 2023-06-21 (×2): 2 g via INTRAVENOUS
  Filled 2023-06-21 (×2): qty 100

## 2023-06-21 MED ORDER — CEFAZOLIN SODIUM-DEXTROSE 2-4 GM/100ML-% IV SOLN
2.0000 g | INTRAVENOUS | Status: AC
  Administered 2023-06-21: 2 g via INTRAVENOUS
  Filled 2023-06-21: qty 100

## 2023-06-21 MED ORDER — ACETAMINOPHEN 325 MG PO TABS: 325.0000 mg | ORAL_TABLET | Freq: Four times a day (QID) | ORAL | Status: DC | PRN

## 2023-06-21 MED ORDER — POLYETHYLENE GLYCOL 3350 17 G PO PACK: 17.0000 g | PACK | Freq: Every day | ORAL | Status: DC | PRN

## 2023-06-21 MED ORDER — WATER FOR IRRIGATION, STERILE IR SOLN
Status: DC | PRN
  Administered 2023-06-21: 1000 mL

## 2023-06-21 MED ORDER — POVIDONE-IODINE 10 % EX SWAB: 2.0000 | Freq: Once | CUTANEOUS | Status: DC

## 2023-06-21 MED ORDER — METHOCARBAMOL 500 MG PO TABS
ORAL_TABLET | ORAL | Status: AC
  Filled 2023-06-21: qty 1

## 2023-06-21 MED ORDER — DOCUSATE SODIUM 100 MG PO CAPS
100.0000 mg | ORAL_CAPSULE | Freq: Two times a day (BID) | ORAL | Status: DC
  Administered 2023-06-21 – 2023-06-22 (×2): 100 mg via ORAL
  Filled 2023-06-21 (×2): qty 1

## 2023-06-21 MED ORDER — OXYCODONE HCL 5 MG/5ML PO SOLN: 5.0000 mg | Freq: Once | ORAL | Status: AC | PRN

## 2023-06-21 MED ORDER — METHOCARBAMOL 1000 MG/10ML IJ SOLN: 500.0000 mg | Freq: Four times a day (QID) | INTRAMUSCULAR | Status: DC | PRN

## 2023-06-21 MED ORDER — KETOROLAC TROMETHAMINE 15 MG/ML IJ SOLN
7.5000 mg | Freq: Four times a day (QID) | INTRAMUSCULAR | Status: AC
  Administered 2023-06-21 – 2023-06-22 (×4): 7.5 mg via INTRAVENOUS
  Filled 2023-06-21 (×4): qty 1

## 2023-06-21 MED ORDER — PROPOFOL 10 MG/ML IV BOLUS
INTRAVENOUS | Status: DC | PRN
  Administered 2023-06-21 (×2): 10 mg via INTRAVENOUS
  Administered 2023-06-21: 100 ug/kg/min via INTRAVENOUS

## 2023-06-21 MED ORDER — ORAL CARE MOUTH RINSE: 15.0000 mL | OROMUCOSAL | Status: DC | PRN

## 2023-06-21 MED ORDER — ONDANSETRON HCL 4 MG PO TABS: 4.0000 mg | ORAL_TABLET | Freq: Four times a day (QID) | ORAL | Status: DC | PRN

## 2023-06-21 MED ORDER — PHENYLEPHRINE HCL-NACL 20-0.9 MG/250ML-% IV SOLN
INTRAVENOUS | Status: DC | PRN
  Administered 2023-06-21: 35 ug/min via INTRAVENOUS

## 2023-06-21 MED ORDER — METOCLOPRAMIDE HCL 5 MG PO TABS: 5.0000 mg | ORAL_TABLET | Freq: Three times a day (TID) | ORAL | Status: DC | PRN

## 2023-06-21 MED ORDER — ONDANSETRON HCL 4 MG/2ML IJ SOLN
4.0000 mg | Freq: Four times a day (QID) | INTRAMUSCULAR | Status: DC | PRN
  Administered 2023-06-21: 4 mg via INTRAVENOUS
  Filled 2023-06-21: qty 2

## 2023-06-21 MED ORDER — SODIUM CHLORIDE 0.9 % IV SOLN: INTRAVENOUS | Status: DC

## 2023-06-21 MED ORDER — MORPHINE SULFATE (PF) 2 MG/ML IV SOLN: 0.5000 mg | INTRAVENOUS | Status: DC | PRN

## 2023-06-21 MED ORDER — DIPHENHYDRAMINE HCL 12.5 MG/5ML PO ELIX: 12.5000 mg | ORAL_SOLUTION | ORAL | Status: DC | PRN

## 2023-06-21 SURGICAL SUPPLY — 47 items
BAG COUNTER SPONGE SURGICOUNT (BAG) IMPLANT
BAG ZIPLOCK 12X15 (MISCELLANEOUS) IMPLANT
CHLORAPREP W/TINT 26 (MISCELLANEOUS) ×1 IMPLANT
COVER PERINEAL POST (MISCELLANEOUS) ×1 IMPLANT
COVER SURGICAL LIGHT HANDLE (MISCELLANEOUS) ×1 IMPLANT
DERMABOND ADVANCED .7 DNX12 (GAUZE/BANDAGES/DRESSINGS) ×2 IMPLANT
DRAPE IMP U-DRAPE 54X76 (DRAPES) ×1 IMPLANT
DRAPE SHEET LG 3/4 BI-LAMINATE (DRAPES) ×3 IMPLANT
DRAPE STERI IOBAN 125X83 (DRAPES) ×1 IMPLANT
DRAPE U-SHAPE 47X51 STRL (DRAPES) ×1 IMPLANT
DRSG AQUACEL AG ADV 3.5X10 (GAUZE/BANDAGES/DRESSINGS) ×1 IMPLANT
ELECT REM PT RETURN 15FT ADLT (MISCELLANEOUS) ×1 IMPLANT
GAUZE SPONGE 4X4 12PLY STRL (GAUZE/BANDAGES/DRESSINGS) ×1 IMPLANT
GLOVE BIO SURGEON STRL SZ7 (GLOVE) ×1 IMPLANT
GLOVE BIO SURGEON STRL SZ8.5 (GLOVE) ×2 IMPLANT
GLOVE BIOGEL PI IND STRL 7.5 (GLOVE) ×1 IMPLANT
GLOVE BIOGEL PI IND STRL 8.5 (GLOVE) ×1 IMPLANT
GOWN SPEC L3 XXLG W/TWL (GOWN DISPOSABLE) ×1 IMPLANT
GOWN STRL REUS W/ TWL XL LVL3 (GOWN DISPOSABLE) ×1 IMPLANT
HEAD CERAMIC BIOLOX 36 T1 STD (Head) IMPLANT
HOLDER FOLEY CATH W/STRAP (MISCELLANEOUS) ×1 IMPLANT
HOOD PEEL AWAY T7 (MISCELLANEOUS) ×3 IMPLANT
KIT TURNOVER KIT A (KITS) IMPLANT
LINER ACETAB VIT E +5 36 F (Liner) IMPLANT
MANIFOLD NEPTUNE II (INSTRUMENTS) ×1 IMPLANT
MARKER SKIN DUAL TIP RULER LAB (MISCELLANEOUS) ×1 IMPLANT
NDL SAFETY ECLIPSE 18X1.5 (NEEDLE) ×1 IMPLANT
NDL SPNL 18GX3.5 QUINCKE PK (NEEDLE) ×1 IMPLANT
NEEDLE SPNL 18GX3.5 QUINCKE PK (NEEDLE) ×1 IMPLANT
PACK ANTERIOR HIP CUSTOM (KITS) ×1 IMPLANT
PENCIL SMOKE EVACUATOR (MISCELLANEOUS) ×1 IMPLANT
SAW OSC TIP CART 19.5X105X1.3 (SAW) ×1 IMPLANT
SEALER BIPOLAR AQUA 6.0 (INSTRUMENTS) ×1 IMPLANT
SET HNDPC FAN SPRY TIP SCT (DISPOSABLE) ×1 IMPLANT
SHELL ACET G7 4H 56 SZF (Shell) IMPLANT
SOLUTION PRONTOSAN WOUND 350ML (IRRIGATION / IRRIGATOR) ×1 IMPLANT
SPIKE FLUID TRANSFER (MISCELLANEOUS) ×1 IMPLANT
STEM FEMORAL TAPERLOC 13X111 (Stem) IMPLANT
SUT MNCRL AB 3-0 PS2 18 (SUTURE) ×1 IMPLANT
SUT MON AB 2-0 CT1 36 (SUTURE) ×1 IMPLANT
SUT STRATAFIX 14 PDO 48 VLT (SUTURE) ×1 IMPLANT
SUT VIC AB 2-0 CT1 TAPERPNT 27 (SUTURE) IMPLANT
SYR 3ML LL SCALE MARK (SYRINGE) ×1 IMPLANT
TOWEL GREEN STERILE FF (TOWEL DISPOSABLE) ×1 IMPLANT
TRAY FOLEY MTR SLVR 14FR STAT (SET/KITS/TRAYS/PACK) IMPLANT
TUBE SUCTION HIGH CAP CLEAR NV (SUCTIONS) ×1 IMPLANT
WATER STERILE IRR 1000ML POUR (IV SOLUTION) ×1 IMPLANT

## 2023-06-21 NOTE — Evaluation (Signed)
 Physical Therapy Evaluation Patient Details Name: Maureen Figueroa MRN: [id number] DOB: 26-Sep-1950 Today's Date: 06/21/2023  History of Present Illness  Pt is 73 yo female s/p R anterior THA on 06/21/23.  Pt with hx including but not limited to breast CA s/p mastectomy, osteopenia, HTN, arthritis, lyme disease  Clinical Impression  Pt is s/p THA resulting in the deficits listed below (see PT Problem List). At baseline, pt is independent.  She lives alone but daughter to stay with her for a few days.  Has 3 steps to enter home but no rails. Today, pt with good pain control and able to ambulate 45' with RW and CGA with some gait deviations but steady balance.  Required min A for bed mobility.  Expected to progress well with therapy. Pt will benefit from acute skilled PT to increase their independence and safety with mobility to facilitate discharge.          If plan is discharge home, recommend the following: A little help with walking and/or transfers;A little help with bathing/dressing/bathroom;Assistance with cooking/housework;Help with stairs or ramp for entrance   Can travel by private vehicle        Equipment Recommendations Rolling walker (2 wheels)  Recommendations for Other Services       Functional Status Assessment Patient has had a recent decline in their functional status and demonstrates the ability to make significant improvements in function in a reasonable and predictable amount of time.     Precautions / Restrictions Precautions Precautions: Fall Restrictions Weight Bearing Restrictions Per Provider Order: Yes RLE Weight Bearing Per Provider Order: Weight bearing as tolerated      Mobility  Bed Mobility Overal bed mobility: Needs Assistance Bed Mobility: Supine to Sit, Sit to Supine     Supine to sit: Min assist Sit to supine: Min assist   General bed mobility comments: Min A for R LE; educated on bed mobility techniques for home.  Pt also asking about  sleeping position - educated on positions of comfort typically being supine with head elevated and pillow under knees or opposite sidelying with pillow between kneed    Transfers Overall transfer level: Needs assistance Equipment used: Rolling walker (2 wheels) Transfers: Sit to/from Stand Sit to Stand: Contact guard assist           General transfer comment: cues for hand placement    Ambulation/Gait Ambulation/Gait assistance: Contact guard assist Gait Distance (Feet): 50 Feet Assistive device: Rolling walker (2 wheels) Gait Pattern/deviations: Step-to pattern, Decreased stride length, Wide base of support, Antalgic, Decreased weight shift to right Gait velocity: decreased     General Gait Details: Educated on sequencing , RW use and proximity.  Pt tending to internally rotate R hip , decreased dorsiflexion (foot flat) and slighlty valuting.  She does report wearing orthotic at baseline, dealing with hip pain for over a year, and has hip bursitis  Stairs            Wheelchair Mobility     Tilt Bed    Modified Rankin (Stroke Patients Only)       Balance Overall balance assessment: Needs assistance Sitting-balance support: No upper extremity supported Sitting balance-Leahy Scale: Good     Standing balance support: Bilateral upper extremity supported, Reliant on assistive device for balance Standing balance-Leahy Scale: Poor                               Pertinent Vitals/Pain  Pain Assessment Pain Assessment: 0-10 Pain Score: 3  Pain Location: R hip Pain Descriptors / Indicators: Discomfort Pain Intervention(s): Limited activity within patient's tolerance, Monitored during session, Premedicated before session, Repositioned, Ice applied    Home Living Family/patient expects to be discharged to:: Private residence Living Arrangements: Alone Available Help at Discharge: Family;Available 24 hours/day (dtr staying through weekend) Type of Home:  House Home Access: Stairs to enter Entrance Stairs-Rails: None Entrance Stairs-Number of Steps: 3   Home Layout: Two level;Able to live on main level with bedroom/bathroom Home Equipment: Grab bars - tub/shower;Cane - single point Additional Comments: has shower chair ordered    Prior Function Prior Level of Function : Independent/Modified Independent             Mobility Comments: Can ambulate in community ADLs Comments: independent with adls and iadls     Extremity/Trunk Assessment   Upper Extremity Assessment Upper Extremity Assessment: Overall WFL for tasks assessed    Lower Extremity Assessment Lower Extremity Assessment: LLE deficits/detail;RLE deficits/detail RLE Deficits / Details: Expected post op changes; ROM: WFL - L hip does internally rotate minimally and pt reports feels tight lateral thigh when moved to neutral; MMT: ankle 5/5, knee 3/5, hip 1/5 LLE Deficits / Details: ROM WFL; MMT  5/5    Cervical / Trunk Assessment Cervical / Trunk Assessment: Normal  Communication        Cognition Arousal: Alert Behavior During Therapy: WFL for tasks assessed/performed   PT - Cognitive impairments: No apparent impairments                                 Cueing       General Comments General comments (skin integrity, edema, etc.): Pt with questions regarding return to activites and rehab recovery process/time frame.  Answered within scope of practice and discussed clearing with MD prior to return to driving. Educated on transfer techniques , safety, car transfers.  Pt asked how long would need assist at home- discussed would depend on progress.  Currently daughter staying through Sunday then intermittent assist.  Educated on devices to make more independent such as walker bag/tray and ADL kit.  Pt asked about hip precautions as she had received some information on these- discussed no formal hip precautions but should refrain from lifting, squating, more  strenous activities until cleared by MD.    Exercises Total Joint Exercises Ankle Circles/Pumps: AROM, Both, 10 reps, Supine Heel Slides: AAROM, Right, 5 reps, Supine, Limitations Heel Slides Limitations: DIscussed did not have to do for exercise tonight but could use belt to assist with R LE repositioning if needed   Assessment/Plan    PT Assessment Patient needs continued PT services  PT Problem List Decreased strength;Pain;Decreased range of motion;Decreased activity tolerance;Decreased balance;Decreased mobility;Decreased knowledge of use of DME       PT Treatment Interventions DME instruction;Therapeutic exercise;Gait training;Balance training;Stair training;Modalities;Functional mobility training;Therapeutic activities;Patient/family education    PT Goals (Current goals can be found in the Care Plan section)  Acute Rehab PT Goals Patient Stated Goal: return home PT Goal Formulation: With patient/family Time For Goal Achievement: 07/05/23 Potential to Achieve Goals: Good    Frequency 7X/week     Co-evaluation               AM-PAC PT "6 Clicks" Mobility  Outcome Measure Help needed turning from your back to your side while in a flat bed without using bedrails?: A  Little Help needed moving from lying on your back to sitting on the side of a flat bed without using bedrails?: A Little Help needed moving to and from a bed to a chair (including a wheelchair)?: A Little Help needed standing up from a chair using your arms (e.g., wheelchair or bedside chair)?: A Little Help needed to walk in hospital room?: A Little Help needed climbing 3-5 steps with a railing? : A Little 6 Click Score: 18    End of Session Equipment Utilized During Treatment: Gait belt Activity Tolerance: Patient tolerated treatment well Patient left: in bed;with call bell/phone within reach;with SCD's reapplied;with family/visitor present Nurse Communication: Mobility status PT Visit Diagnosis:  Other abnormalities of gait and mobility (R26.89);Muscle weakness (generalized) (M62.81)    Time: 4098-1191 PT Time Calculation (min) (ACUTE ONLY): 43 min   Charges:   PT Evaluation $PT Eval Low Complexity: 1 Low PT Treatments $Gait Training: 8-22 mins $Therapeutic Activity: 8-22 mins PT General Charges $$ ACUTE PT VISIT: 1 Visit         Cyd Dowse, PT Acute Rehab [facility] Rehab [phone]   Carolynn Citrin 06/21/2023, 3:58 PM

## 2023-06-21 NOTE — Discharge Instructions (Signed)

## 2023-06-21 NOTE — Interval H&P Note (Signed)
 History and Physical Interval Note:  06/21/2023 7:21 AM  Maureen Figueroa  has presented today for surgery, with the diagnosis of Right hip osteoarthritis.  The various methods of treatment have been discussed with the patient and family. After consideration of risks, benefits and other options for treatment, the patient has consented to  Procedure(s): ARTHROPLASTY, HIP, TOTAL, ANTERIOR APPROACH (Right) as a surgical intervention.  The patient's history has been reviewed, patient examined, no change in status, stable for surgery.  I have reviewed the patient's chart and labs.  Questions were answered to the patient's satisfaction.     [name] [name]

## 2023-06-21 NOTE — Anesthesia Procedure Notes (Signed)
 Spinal  Patient location during procedure: OR Start time: 06/21/2023 7:31 AM End time: 06/21/2023 7:36 AM Reason for block: surgical anesthesia Staffing Performed: anesthesiologist  Anesthesiologist: Conard Decent, MD Performed by: Conard Decent, MD Authorized by: Conard Decent, MD   Preanesthetic Checklist Completed: patient identified, IV checked, site marked, risks and benefits discussed, surgical consent, monitors and equipment checked, pre-op evaluation and timeout performed Spinal Block Patient position: sitting Prep: DuraPrep Patient monitoring: blood pressure and continuous pulse ox Approach: midline Location: L3-4 Injection technique: single-shot Needle Needle type: Pencan  Needle gauge: 24 G Needle length: 9 cm Additional Notes Risks and benefits of neuraxial anesthesia including, but not limited to, infection, bleeding, local anesthetic toxicity, headache, hypotension, back pain, block failure, etc. were discussed with the patient. The patient expressed understanding and consented to the procedure. I confirmed that the patient has no bleeding disorders and is not taking blood thinners. I confirmed the patient's last platelet count with the nurse. Monitors were applied. A time-out was performed immediately prior to the procedure. Sterile technique was used throughout the whole procedure.   1 attempt(s)

## 2023-06-21 NOTE — Transfer of Care (Signed)
 Immediate Anesthesia Transfer of Care Note  Patient: Maureen Figueroa  Procedure(s) Performed: ARTHROPLASTY, HIP, TOTAL, ANTERIOR APPROACH (Right: Hip)  Patient Location: PACU  Anesthesia Type:MAC and Spinal  Level of Consciousness: awake, alert , and oriented  Airway & Oxygen Therapy: Patient Spontanous Breathing and Patient connected to face mask oxygen  Post-op Assessment: Report given to RN and Post -op Vital signs reviewed and stable  Post vital signs: Reviewed and stable  Last Vitals:  Vitals Value Taken Time  BP    Temp    Pulse 75 06/21/23 1015  Resp    SpO2 100 % 06/21/23 1015  Vitals shown include unfiled device data.  Last Pain:  Vitals:   06/21/23 0551  TempSrc:   PainSc: 0-No pain         Complications: No notable events documented.

## 2023-06-21 NOTE — Op Note (Signed)
 OPERATIVE REPORT  SURGEON: Adonica Hoose, MD   ASSISTANT: Trixie Furnace, PA-C.  PREOPERATIVE DIAGNOSIS: Right hip arthritis.   POSTOPERATIVE DIAGNOSIS: Right hip arthritis.   PROCEDURE: Right total hip arthroplasty, anterior approach.   IMPLANTS: Biomet Taperloc Complete Microplasty stem, size 13 x 111 mm, high offset. Biomet G7 OsseoTi Cup, size 56 mm. Biomet Vivacit-E liner, size 36 mm, F, +5 mm neutral. Biomet Biolox ceramic head ball, size 36 + 0 mm.  ANESTHESIA:  MAC, Regional, and Spinal  ESTIMATED BLOOD LOSS:-400 mL    ANTIBIOTICS: 2g Ancef.  DRAINS: None.  COMPLICATIONS: None.   CONDITION: PACU - hemodynamically stable.   BRIEF CLINICAL NOTE: Maureen Figueroa is a 73 y.o. female with a long-standing history of Right hip arthritis. After failing conservative management, the patient was indicated for total hip arthroplasty. The risks, benefits, and alternatives to the procedure were explained, and the patient elected to proceed.  PROCEDURE IN DETAIL: Surgical site was marked by myself in the pre-op holding area. Once inside the operating room, spinal anesthesia was obtained, and a foley catheter was inserted. The patient was then positioned on the Hana table.  All bony prominences were well padded.  The hip was prepped and draped in the normal sterile surgical fashion.  A time-out was called verifying side and site of surgery. The patient received IV antibiotics within 60 minutes of beginning the procedure.   Bikini incision was made, and superficial dissection was performed lateral to the ASIS. The direct anterior approach to the hip was performed through the Hueter interval.  Lateral femoral circumflex vessels were treated with the Auqumantys. The anterior capsule was exposed and an inverted T capsulotomy was made. The femoral neck cut was made to the level of the templated cut.  A corkscrew was placed into the head and the head was removed.  The femoral head was found  to have eburnated bone. The head was passed to the back table and was measured. Pubofemoral ligament was released off of the calcar, taking care to stay on bone. Superior capsule was released from the greater trochanter, taking care to stay lateral to the posterior border of the femoral neck in order to preserve the short external rotators.   Acetabular exposure was achieved, and the pulvinar and labrum were excised. Sequential reaming of the acetabulum was then performed up to a size 55 mm reamer. A 56 mm cup was then opened and impacted into place at approximately 40 degrees of abduction and 20 degrees of anteversion. The final polyethylene liner was impacted into place and acetabular osteophytes were removed.    I then gained femoral exposure taking care to protect the abductors and greater trochanter.  This was performed using standard external rotation, extension, and adduction.  A cookie cutter was used to enter the femoral canal, and then the femoral canal finder was placed.  Sequential broaching was performed up to a size 13.  Calcar planer was used on the femoral neck remnant.  I placed a high offset neck and a trial head ball.  The hip was reduced.  Leg lengths and offset were checked fluoroscopically.  The hip was dislocated and trial components were removed.  The final implants were placed, and the hip was reduced.  Fluoroscopy was used to confirm component position and leg lengths.  At 90 degrees of external rotation and full extension, the hip was stable to an anterior directed force.   The wound was copiously irrigated with Prontosan solution and normal saline using  pulse lavage.  Marcaine solution was injected into the periarticular soft tissue.  The wound was closed in layers using #1 Vicryl and V-Loc for the fascia, 2-0 Vicryl for the subcutaneous fat, 2-0 Monocryl for the deep dermal layer, 3-0 running Monocryl subcuticular stitch, and Dermabond for the skin.  Once the glue was fully dried,  an Aquacell Ag dressing was applied.  The patient was transported to the recovery room in stable condition.  Sponge, needle, and instrument counts were correct at the end of the case x2.  The patient tolerated the procedure well and there were no known complications.  Please note that a surgical assistant was a medical necessity for this procedure to perform it in a safe and expeditious manner. Assistant was necessary to provide appropriate retraction of vital neurovascular structures, to prevent femoral fracture, and to allow for anatomic placement of the prosthesis.

## 2023-06-21 NOTE — Anesthesia Procedure Notes (Signed)
 Procedure Name: MAC Date/Time: 06/21/2023 7:31 AM  Performed by: Jenene Miser, CRNAPre-anesthesia Checklist: Patient identified, Emergency Drugs available, Suction available and Patient being monitored Oxygen Delivery Method: Simple face mask

## 2023-06-21 NOTE — Anesthesia Postprocedure Evaluation (Signed)
 Anesthesia Post Note  Patient: Maureen Figueroa  Procedure(s) Performed: ARTHROPLASTY, HIP, TOTAL, ANTERIOR APPROACH (Right: Hip)     Patient location during evaluation: PACU Anesthesia Type: MAC and Spinal Level of consciousness: awake Pain management: pain level controlled Vital Signs Assessment: post-procedure vital signs reviewed and stable Respiratory status: spontaneous breathing, respiratory function stable and nonlabored ventilation Cardiovascular status: blood pressure returned to baseline and stable Postop Assessment: no headache, no backache and no apparent nausea or vomiting Anesthetic complications: no   No notable events documented.  Last Vitals:  Vitals:   06/21/23 1750 06/21/23 2032  BP: 122/63 (!) 107/57  Pulse: 72 73  Resp: 20 18  Temp: 36.5 C 36.8 C  SpO2: 97% 95%    Last Pain:  Vitals:   06/21/23 2031  TempSrc:   PainSc: 3                  Conard Decent

## 2023-06-22 ENCOUNTER — Encounter (HOSPITAL_COMMUNITY): Payer: Self-pay | Admitting: Orthopedic Surgery

## 2023-06-22 ENCOUNTER — Other Ambulatory Visit (HOSPITAL_COMMUNITY): Payer: Self-pay

## 2023-06-22 DIAGNOSIS — M1611 Unilateral primary osteoarthritis, right hip: Secondary | ICD-10-CM | POA: Diagnosis not present

## 2023-06-22 DIAGNOSIS — D62 Acute posthemorrhagic anemia: Secondary | ICD-10-CM | POA: Diagnosis not present

## 2023-06-22 DIAGNOSIS — M810 Age-related osteoporosis without current pathological fracture: Secondary | ICD-10-CM | POA: Diagnosis not present

## 2023-06-22 DIAGNOSIS — I7121 Aneurysm of the ascending aorta, without rupture: Secondary | ICD-10-CM | POA: Diagnosis not present

## 2023-06-22 DIAGNOSIS — Z853 Personal history of malignant neoplasm of breast: Secondary | ICD-10-CM | POA: Diagnosis not present

## 2023-06-22 DIAGNOSIS — I10 Essential (primary) hypertension: Secondary | ICD-10-CM | POA: Diagnosis not present

## 2023-06-22 LAB — BASIC METABOLIC PANEL WITH GFR
Anion gap: 3 — ABNORMAL LOW (ref 5–15)
BUN: 11 mg/dL (ref 8–23)
CO2: 26 mmol/L (ref 22–32)
Calcium: 7.3 mg/dL — ABNORMAL LOW (ref 8.9–10.3)
Chloride: 105 mmol/L (ref 98–111)
Creatinine, Ser: 0.49 mg/dL (ref 0.44–1.00)
GFR, Estimated: >60 mL/min (ref >60)
Glucose, Bld: 106 mg/dL — ABNORMAL HIGH (ref 70–99)
Potassium: 3.4 mmol/L — ABNORMAL LOW (ref 3.5–5.1)
Sodium: 134 mmol/L — ABNORMAL LOW (ref 135–145)

## 2023-06-22 LAB — CBC
HCT: 34.1 % — ABNORMAL LOW (ref 36.0–46.0)
Hemoglobin: 10.8 g/dL — ABNORMAL LOW (ref 12.0–15.0)
MCH: 33.9 pg (ref 26.0–34.0)
MCHC: 31.7 g/dL (ref 30.0–36.0)
MCV: 106.9 fL — ABNORMAL HIGH (ref 80.0–100.0)
Platelets: 172 10*3/uL (ref 150–400)
RBC: 3.19 MIL/uL — ABNORMAL LOW (ref 3.87–5.11)
RDW: 12.6 % (ref 11.5–15.5)
WBC: 5.9 10*3/uL (ref 4.0–10.5)
nRBC: 0 % (ref 0.0–0.2)

## 2023-06-22 MED ORDER — POLYETHYLENE GLYCOL 3350 17 GM/SCOOP PO POWD
17.0000 g | Freq: Every day | ORAL | 0 refills | Status: AC | PRN
  Filled 2023-06-22: qty 238, 14d supply, fill #0

## 2023-06-22 MED ORDER — ASPIRIN 81 MG PO CHEW
81.0000 mg | CHEWABLE_TABLET | Freq: Two times a day (BID) | ORAL | 0 refills | Status: AC
  Filled 2023-06-22: qty 90, 45d supply, fill #0

## 2023-06-22 MED ORDER — ONDANSETRON HCL 4 MG PO TABS
4.0000 mg | ORAL_TABLET | Freq: Three times a day (TID) | ORAL | 0 refills | Status: DC | PRN
  Filled 2023-06-22: qty 30, 10d supply, fill #0

## 2023-06-22 MED ORDER — SENNA 8.6 MG PO TABS
2.0000 | ORAL_TABLET | Freq: Every day | ORAL | 0 refills | Status: AC
  Filled 2023-06-22: qty 30, 15d supply, fill #0

## 2023-06-22 MED ORDER — HYDROCODONE-ACETAMINOPHEN 5-325 MG PO TABS
1.0000 | ORAL_TABLET | ORAL | 0 refills | Status: AC | PRN
  Filled 2023-06-22: qty 42, 7d supply, fill #0

## 2023-06-22 MED ORDER — DOCUSATE SODIUM 100 MG PO CAPS
100.0000 mg | ORAL_CAPSULE | Freq: Two times a day (BID) | ORAL | 0 refills | Status: AC
  Filled 2023-06-22: qty 60, 30d supply, fill #0

## 2023-06-22 NOTE — Progress Notes (Signed)
 Patient discharged to home w/ dtr. Given all belongings, instructions, medications, equipment. Verbalized understanding of all instructions. Escorted to pov via w/c.

## 2023-06-22 NOTE — Progress Notes (Signed)
 Physical Therapy Treatment Patient Details Name: Maureen Figueroa MRN: [id number] DOB: [birth year] Today's Date: 06/22/2023   History of Present Illness Pt is 73 yo female s/p R anterior THA on 06/21/23.  Pt with hx including but not limited to breast CA s/p mastectomy, osteopenia, HTN, arthritis, lyme disease    PT Comments  Pt ambulated short distance in hallway and performed LE exercises.  Pt reports issues with R hip bursitis so discussed modifying exercises in hopes of avoiding flare up.  Pt anticipates d/c home possibly later today so will return to practice steps/stair technique.     If plan is discharge home, recommend the following: A little help with walking and/or transfers;A little help with bathing/dressing/bathroom;Assistance with cooking/housework;Help with stairs or ramp for entrance   Can travel by private vehicle        Equipment Recommendations  Rolling walker (2 wheels)    Recommendations for Other Services       Precautions / Restrictions Precautions Precautions: Fall Restrictions RLE Weight Bearing Per Provider Order: Weight bearing as tolerated     Mobility  Bed Mobility Overal bed mobility: Needs Assistance Bed Mobility: Supine to Sit     Supine to sit: Contact guard     General bed mobility comments: increased time and effort however pt able to self assist Rt LE with gait belt    Transfers Overall transfer level: Needs assistance Equipment used: Rolling walker (2 wheels) Transfers: Sit to/from Stand Sit to Stand: Contact guard assist           General transfer comment: verbal cues for hand placement    Ambulation/Gait Ambulation/Gait assistance: Contact guard assist Gait Distance (Feet): 60 Feet Assistive device: Rolling walker (2 wheels) Gait Pattern/deviations: Step-to pattern, Antalgic, Decreased stance time - right Gait velocity: decreased     General Gait Details: verbal cues for sequence, RW positioning, step  length   Stairs             Wheelchair Mobility     Tilt Bed    Modified Rankin (Stroke Patients Only)       Balance                                            Communication Communication Communication: No apparent difficulties  Cognition Arousal: Alert Behavior During Therapy: WFL for tasks assessed/performed   PT - Cognitive impairments: No apparent impairments                         Following commands: Intact      Cueing    Exercises Total Joint Exercises Ankle Circles/Pumps: AROM, Both, 10 reps, Supine Quad Sets: AROM, Both, 10 reps Towel Squeeze: AROM, Both, 10 reps Heel Slides: AAROM, Right, 10 reps Hip ABduction/ADduction: AAROM, Right, 10 reps    General Comments        Pertinent Vitals/Pain Pain Assessment Pain Assessment: 0-10 Pain Score: 5  Pain Location: R hip Pain Descriptors / Indicators: Aching, Sore Pain Intervention(s): Repositioned, Monitored during session, Ice applied    Home Living                          Prior Function            PT Goals (current goals can now be found in the care plan section) Progress  towards PT goals: Progressing toward goals    Frequency    7X/week      PT Plan      Co-evaluation              AM-PAC PT "6 Clicks" Mobility   Outcome Measure  Help needed turning from your back to your side while in a flat bed without using bedrails?: A Little Help needed moving from lying on your back to sitting on the side of a flat bed without using bedrails?: A Little Help needed moving to and from a bed to a chair (including a wheelchair)?: A Little Help needed standing up from a chair using your arms (e.g., wheelchair or bedside chair)?: A Little Help needed to walk in hospital room?: A Little Help needed climbing 3-5 steps with a railing? : A Little 6 Click Score: 18    End of Session Equipment Utilized During Treatment: Gait belt Activity  Tolerance: Patient tolerated treatment well Patient left: in chair;with call bell/phone within reach;with chair alarm set   PT Visit Diagnosis: Other abnormalities of gait and mobility (R26.89);Muscle weakness (generalized) (M62.81)     Time: 6295-2841 PT Time Calculation (min) (ACUTE ONLY): 29 min  Charges:    $Gait Training: 8-22 mins $Therapeutic Exercise: 8-22 mins PT General Charges $$ ACUTE PT VISIT: 1 Visit                     Blanch Bunde, DPT Physical Therapist Acute Rehabilitation Services Office: [phone]    Kati L Payson 06/22/2023, 1:09 PM

## 2023-06-22 NOTE — TOC Transition Note (Signed)
 Transition of Care [facility]) - Discharge Note   Patient Details  Name: Maureen Figueroa MRN: [id number] Date of Birth: 1950/03/15  [facility] CM/SW Contact:  Delilah Fend, LCSW Phone Number: 06/22/2023, 9:29 AM   Clinical Narrative:     Met with pt and confirming she has received RW to room via [employer].  Plan for HEP.  No further TOC needs.  Final next level of care: Home/Self Care Barriers to Discharge: No Barriers Identified   Patient Goals and CMS Choice Patient states their goals for this hospitalization and ongoing recovery are:: return home          Discharge Placement                       Discharge Plan and Services Additional resources added to the After Visit Summary for                  DME Arranged: Walker rolling DME Agency: [employer]                  Social Drivers of Health (SDOH) Interventions SDOH Screenings   Food Insecurity: No Food Insecurity (06/21/2023)  Housing: Low Risk  (06/21/2023)  Transportation Needs: No Transportation Needs (06/21/2023)  Utilities: Not At Risk (06/21/2023)  Social Connections: Unknown (06/21/2023)  Tobacco Use: Low Risk  (06/21/2023)     Readmission Risk Interventions     No data to display

## 2023-06-22 NOTE — Progress Notes (Signed)
 Discharge medications delivered to bedside patient asleep, Milda Aline RN made aware D Rosevelt Constable RN

## 2023-06-22 NOTE — Progress Notes (Addendum)
    Subjective:  Patient reports pain as mild to moderate.  Denies N/V/CP/SOB/Abd pain. She reports pain in her thigh that is managed with medication. She denies any tingling or numbness in LE bilaterally. Foley catheter in this morning.   Objective:   VITALS:   Vitals:   06/21/23 1750 06/21/23 2032 06/22/23 0138 06/22/23 0613  BP: 122/63 (!) 107/57 112/63 134/64  Pulse: 72 73 83 77  Resp: 20 18 18 18   Temp: 97.7 F (36.5 C) 98.2 F (36.8 C) 98.7 F (37.1 C) 99 F (37.2 C)  TempSrc:      SpO2: 97% 95% 92% 96%  Weight:      Height:        NAD Neurologically intact ABD soft Neurovascular intact Sensation intact distally Intact pulses distally Dorsiflexion/Plantar flexion intact Incision: dressing C/D/I No cellulitis present Compartment soft   Lab Results  Component Value Date   WBC 5.9 06/22/2023   HGB 10.8 (L) 06/22/2023   HCT 34.1 (L) 06/22/2023   MCV 106.9 (H) 06/22/2023   PLT 172 06/22/2023   BMET    Component Value Date/Time   NA 134 (L) 06/22/2023 0322   K 3.4 (L) 06/22/2023 0322   CL 105 06/22/2023 0322   CO2 26 06/22/2023 0322   GLUCOSE 106 (H) 06/22/2023 0322   BUN 11 06/22/2023 0322   CREATININE 0.49 06/22/2023 0322   CREATININE 0.56 02/28/2023 1106   CALCIUM 7.3 (L) 06/22/2023 0322   GFRNONAA >60 06/22/2023 0322   GFRNONAA >60 02/28/2023 1106     Assessment/Plan: 1 Day Post-Op   Principal Problem:   Primary osteoarthritis of right hip Active Problems:   S/P total right hip arthroplasty  ABLA. Hemoglobin 10.8. Continue to monitor.   WBAT with walker DVT ppx: Aspirin , SCDs, TEDS PO pain control PT/OT: She ambulated 50 ft with PT yesterday. Continue PT today.  Dispo:  - Remove foley catheter.  - D/c home with HEP once cleared with PT.    Harman Lightning 06/22/2023, 7:26 AM   [facility]  [place] [address], [place], [place] [address] Phone: [phone] [email or web address] Facebook  [employer]

## 2023-06-22 NOTE — Progress Notes (Signed)
 Physical Therapy Treatment Patient Details Name: Maureen Figueroa MRN: [id number] DOB: 1950/07/30 Today's Date: 06/22/2023   History of Present Illness Pt is 73 yo female s/p R anterior THA on 06/21/23.  Pt with hx including but not limited to breast CA s/p mastectomy, osteopenia, HTN, arthritis, lyme disease    PT Comments  Pt ambulated in hallway and practiced safe stair technique.  Daughter present and observed session.  Pt provided with HEP and stair handouts.  Pt and daughter had no further questions.  Pt feels ready for d/c home today.     If plan is discharge home, recommend the following: A little help with walking and/or transfers;A little help with bathing/dressing/bathroom;Assistance with cooking/housework;Help with stairs or ramp for entrance   Can travel by private vehicle        Equipment Recommendations  Rolling walker (2 wheels)    Recommendations for Other Services       Precautions / Restrictions Precautions Precautions: Fall Restrictions RLE Weight Bearing Per Provider Order: Weight bearing as tolerated     Mobility  Bed Mobility Overal bed mobility: Needs Assistance Bed Mobility: Supine to Sit     Supine to sit: Contact guard     General bed mobility comments: pt in recliner    Transfers Overall transfer level: Needs assistance Equipment used: Rolling walker (2 wheels) Transfers: Sit to/from Stand Sit to Stand: Supervision           General transfer comment: verbal cues for hand placement    Ambulation/Gait Ambulation/Gait assistance: Contact guard assist, Supervision Gait Distance (Feet): 60 Feet Assistive device: Rolling walker (2 wheels) Gait Pattern/deviations: Step-to pattern, Antalgic, Decreased stance time - right Gait velocity: decreased     General Gait Details: verbal cues for sequence, RW positioning, step length   Stairs Stairs: Yes Stairs assistance: Contact guard assist Stair Management: With walker, Step to pattern,  Backwards Number of Stairs: 2 General stair comments: verbal cues for sequence, safety, RW positioning; daughter present and assisted with RW; provided handout, pt and daughter report understanding   Wheelchair Mobility     Tilt Bed    Modified Rankin (Stroke Patients Only)       Balance                                            Communication Communication Communication: No apparent difficulties  Cognition Arousal: Alert Behavior During Therapy: WFL for tasks assessed/performed   PT - Cognitive impairments: No apparent impairments                         Following commands: Intact      Cueing    Exercises     General Comments        Pertinent Vitals/Pain Pain Assessment Pain Assessment: 0-10 Pain Score: 5  Pain Location: R hip Pain Descriptors / Indicators: Aching, Sore Pain Intervention(s): Monitored during session, Repositioned    Home Living                          Prior Function            PT Goals (current goals can now be found in the care plan section) Progress towards PT goals: Progressing toward goals    Frequency    7X/week      PT  Plan      Co-evaluation              AM-PAC PT "6 Clicks" Mobility   Outcome Measure  Help needed turning from your back to your side while in a flat bed without using bedrails?: A Little Help needed moving from lying on your back to sitting on the side of a flat bed without using bedrails?: A Little Help needed moving to and from a bed to a chair (including a wheelchair)?: A Little Help needed standing up from a chair using your arms (e.g., wheelchair or bedside chair)?: A Little Help needed to walk in hospital room?: A Little Help needed climbing 3-5 steps with a railing? : A Little 6 Click Score: 18    End of Session Equipment Utilized During Treatment: Gait belt Activity Tolerance: Patient tolerated treatment well Patient left: in chair;with call  bell/phone within reach;with family/visitor present;with chair alarm set Nurse Communication: Mobility status PT Visit Diagnosis: Other abnormalities of gait and mobility (R26.89);Muscle weakness (generalized) (M62.81)     Time: 1410-1433 PT Time Calculation (min) (ACUTE ONLY): 23 min  Charges:    $Gait Training: 23-37 mins  PT General Charges $$ ACUTE PT VISIT: 1 Visit                    Henretta Lodge PT, DPT Physical Therapist Acute Rehabilitation Services Office: [phone]    Kati L Payson 06/22/2023, 3:10 PM

## 2023-06-22 NOTE — Plan of Care (Signed)
  Problem: Health Behavior/Discharge Planning: Goal: Ability to manage health-related needs will improve Outcome: Progressing   Problem: Clinical Measurements: Goal: Ability to maintain clinical measurements within normal limits will improve Outcome: Progressing Goal: Will remain free from infection Outcome: Progressing Goal: Diagnostic test results will improve Outcome: Progressing Goal: Respiratory complications will improve Outcome: Progressing Goal: Cardiovascular complication will be avoided Outcome: Progressing   Problem: Activity: Goal: Risk for activity intolerance will decrease Outcome: Adequate for Discharge   Problem: Nutrition: Goal: Adequate nutrition will be maintained Outcome: Completed/Met   Problem: Coping: Goal: Level of anxiety will decrease Outcome: Progressing   Problem: Coping: Goal: Level of anxiety will decrease Outcome: Progressing   Problem: Elimination: Goal: Will not experience complications related to bowel motility Outcome: Progressing Goal: Will not experience complications related to urinary retention Outcome: Progressing   Problem: Pain Managment: Goal: General experience of comfort will improve and/or be controlled Outcome: Progressing   Problem: Safety: Goal: Ability to remain free from injury will improve Outcome: Progressing   Problem: Skin Integrity: Goal: Risk for impaired skin integrity will decrease Outcome: Progressing   Problem: Education: Goal: Knowledge of the prescribed therapeutic regimen will improve Outcome: Progressing Goal: Understanding of discharge needs will improve Outcome: Progressing   Problem: Activity: Goal: Ability to avoid complications of mobility impairment will improve Outcome: Adequate for Discharge Goal: Ability to tolerate increased activity will improve Outcome: Adequate for Discharge   Problem: Clinical Measurements: Goal: Postoperative complications will be avoided or  minimized Outcome: Progressing   Problem: Pain Management: Goal: Pain level will decrease with appropriate interventions Outcome: Adequate for Discharge   Problem: Skin Integrity: Goal: Will show signs of wound healing Outcome: Progressing

## 2023-07-14 DIAGNOSIS — Z471 Aftercare following joint replacement surgery: Secondary | ICD-10-CM | POA: Diagnosis not present

## 2023-07-14 DIAGNOSIS — Z96641 Presence of right artificial hip joint: Secondary | ICD-10-CM | POA: Diagnosis not present

## 2023-08-11 DIAGNOSIS — Z471 Aftercare following joint replacement surgery: Secondary | ICD-10-CM | POA: Diagnosis not present

## 2023-08-11 DIAGNOSIS — Z96641 Presence of right artificial hip joint: Secondary | ICD-10-CM | POA: Diagnosis not present

## 2023-08-15 ENCOUNTER — Ambulatory Visit

## 2023-08-22 ENCOUNTER — Other Ambulatory Visit (HOSPITAL_COMMUNITY): Payer: Self-pay

## 2023-08-30 ENCOUNTER — Ambulatory Visit (INDEPENDENT_AMBULATORY_CARE_PROVIDER_SITE_OTHER): Payer: Medicare PPO | Admitting: Obstetrics and Gynecology

## 2023-08-30 ENCOUNTER — Other Ambulatory Visit (HOSPITAL_COMMUNITY)
Admission: RE | Admit: 2023-08-30 | Discharge: 2023-08-30 | Disposition: A | Discharge status: Home / Self Care | Source: Ambulatory Visit | Attending: Obstetrics and Gynecology | Admitting: Obstetrics and Gynecology

## 2023-08-30 ENCOUNTER — Encounter: Payer: Self-pay | Admitting: Obstetrics and Gynecology

## 2023-08-30 VITALS — BP 96/70 | HR 66 | Ht 66.0 in | Wt 168.4 lb

## 2023-08-30 DIAGNOSIS — N889 Noninflammatory disorder of cervix uteri, unspecified: Secondary | ICD-10-CM

## 2023-08-30 DIAGNOSIS — B009 Herpesviral infection, unspecified: Secondary | ICD-10-CM

## 2023-08-30 DIAGNOSIS — Z853 Personal history of malignant neoplasm of breast: Secondary | ICD-10-CM | POA: Diagnosis not present

## 2023-08-30 DIAGNOSIS — Z01419 Encounter for gynecological examination (general) (routine) without abnormal findings: Secondary | ICD-10-CM | POA: Insufficient documentation

## 2023-08-30 DIAGNOSIS — Z9189 Other specified personal risk factors, not elsewhere classified: Secondary | ICD-10-CM | POA: Diagnosis not present

## 2023-08-30 DIAGNOSIS — Z124 Encounter for screening for malignant neoplasm of cervix: Secondary | ICD-10-CM | POA: Insufficient documentation

## 2023-08-30 DIAGNOSIS — Z9289 Personal history of other medical treatment: Secondary | ICD-10-CM

## 2023-08-30 MED ORDER — VALACYCLOVIR HCL 1 G PO TABS: ORAL_TABLET | ORAL | 3 refills | Status: AC

## 2023-08-30 MED ORDER — NONFORMULARY OR COMPOUNDED ITEM: 3 refills | Status: AC

## 2023-08-30 NOTE — Progress Notes (Signed)
 73 y.o. G1P1 Married Caucasian female here for a breast and pelvic exam.    The patient is also followed for vaginal atrophy. Not using vag vit E regularly.  Wants to refill this at [facility].   Taking Valtrex  500 gm daily.  Now on medication for elevated blood pressure.   Had right hip replacement.   PCP: Dwight Trula SQUIBB, MD   Patient's last menstrual period was 09/24/2005.           Sexually active: Yes.    The current method of family planning is post menopausal status.    Menopausal hormone therapy:  n/a Exercising: recovering from hip surgery.  Smoker:  no  OB History     Gravida  1   Para  1   Term      Preterm      AB      Living  1      SAB      IAB      Ectopic      Multiple      Live Births              HEALTH MAINTENANCE: Last 2 paps: 08/29/22 neg HR HPV neg, 11/15/17 neg HPV neg  History of abnormal Pap or positive HPV:  no Mammogram: 05/24/23 - BI-RADS 2, cat C.  Colonoscopy:  01/25/04 Bone Density:  12/13/22  Result  osteopenia, increased risk of hip fracture. On Evista .     Immunization History  Administered Date(s) Administered   Fluzone Influenza virus vaccine,trivalent (IIV3), split virus 10/20/2013, 09/07/2015   Influenza Split 12/04/2011, 11/12/2013   Influenza-Unspecified 11/08/2022   PFIZER(Purple Top)SARS-COV-2 Vaccination 02/16/2019, 03/10/2019   Td 07/13/2005   Tdap 06/05/2015   Zoster, Live 10/20/2013      reports that she has never smoked. She has never used smokeless tobacco. She reports current alcohol  use of about 14.0 standard drinks of alcohol  per week. She reports that she does not use drugs.  Past Medical History:  Diagnosis Date   Aortic aneurysm (HCC)    Arthritis    Back injury 1994   back/neck injury    Cancer (HCC)    Breast cancer--right   Endometriosis    Family history of breast cancer    Family history of kidney cancer    Family history of lung cancer    Hypertension    Lyme  disease, unspecified    followed by PCP   Osteoporosis    Pinched nerve in neck 12/2013   Pneumonia    Postmenopausal bleeding 2016   uterine fibroid, on HRT   STD (sexually transmitted disease)    HSV   Urinary incontinence     Past Surgical History:  Procedure Laterality Date   BREAST SURGERY  07-23-08   benign Rt.breast biopsy--benign stromal fibrosis with calcifications:[facility]   CATARACT EXTRACTION, BILATERAL Bilateral 11/2016, 03/2017   EYE SURGERY Right    cyst removed x2    LASIK     OOPHORECTOMY  12/86   PELVIC LAPAROSCOPY  1986   endometriosis   TOTAL HIP ARTHROPLASTY Right 06/21/2023   Procedure: ARTHROPLASTY, HIP, TOTAL, ANTERIOR APPROACH;  Surgeon: Fidel Rogue, MD;  Location: [facility];  Service: Orthopedics;  Laterality: Right;   TOTAL MASTECTOMY Right 08/03/2020   Procedure: RIGHT TOTAL MASTECTOMY;  Surgeon: Vernetta Berg, MD;  Location: [facility];  Service: General;  Laterality: Right;    Current Outpatient Medications  Medication Sig Dispense Refill   acetaminophen  (TYLENOL )  500 MG tablet Take 1,000 mg by mouth every 6 (six) hours as needed for moderate pain (pain score 4-6) or headache.     Ascorbic Acid (VITAMIN C ) 1000 MG tablet Take 2,000 mg by mouth once a week.     Calcium Carb-Cholecalciferol (CALCIUM 600 + D PO) Take 1 tablet by mouth once a week.     carisoprodol  (SOMA ) 350 MG tablet Take 350 mg by mouth daily as needed for muscle spasms.     meloxicam (MOBIC) 15 MG tablet Take 1 tablet by mouth daily.     NONFORMULARY OR COMPOUNDED ITEM Vitamin E 200 u/ml vaginal suppositories.  Place one per vaginal at hs twice weekly. 24 each 3   selenium sulfide (SELSUN) 2.5 % shampoo Apply 1 Application topically daily as needed for itching.     tretinoin  microspheres (RETIN-A  MICRO) 0.1 % gel Apply 1 application  topically at bedtime as needed (acne).     valACYclovir  (VALTREX ) 1000 MG tablet TAKE 1 TABLET BY MOUTH AT BEDTIME. TAKE 1 TABLET TWICE  DAILY X 3 DAYS FOR GENITAL OUTBREAK AND 2 TABS EVERY 12 HOURS X 1 DAY FOR ORAL OUTBREAK 110 tablet 3   valsartan (DIOVAN) 80 MG tablet Take 160 mg by mouth daily. Pt takes 80 mg in am and 80 mg in pm     cholecalciferol (VITAMIN D3) 25 MCG (1000 UNIT) tablet Take 2,000-3,000 Units by mouth once a week. (Patient not taking: Reported on 08/30/2023)     Multiple Vitamin (MULTI-VITAMIN PO) Take 1 tablet by mouth once a week. (Patient not taking: Reported on 08/30/2023)     SUPER B COMPLEX/C PO Take 1 tablet by mouth once a week. (Patient not taking: Reported on 08/30/2023)     trolamine salicylate (ASPERCREME) 10 % cream      No current facility-administered medications for this visit.    ALLERGIES: Metaxalone, Chocolate, Cocoa, and Penicillins  Family History  Problem Relation Age of Onset   Hypertension Mother    COPD Mother    Hyperlipidemia Mother    Kidney cancer Father 5       dx late 36s   Breast cancer Sister 65   Pancreatic cancer Maternal Aunt        dx 95s (DDT exposure)   Cancer Maternal Aunt        gastrointestinal dx 70s (DDT exposure)   Prostate cancer Maternal Uncle        (DDT exposure)   Osteoporosis Maternal Grandfather    Osteoporosis Paternal Grandmother    Breast cancer Paternal Grandmother 82       mastectomy   Lung cancer Paternal Grandfather        dx late 37s, smoker    Review of Systems  All other systems reviewed and are negative.   PHYSICAL EXAM:  BP 96/70 (BP Location: Left Arm, Patient Position: Sitting, Cuff Size: Normal)   Pulse 66   Ht 5' 6 (1.676 m)   Wt 168 lb 6.4 oz (76.4 kg)   LMP 09/24/2005   SpO2 96%   BMI 27.18 kg/m     General appearance: alert, cooperative and appears stated age Head: normocephalic, without obvious abnormality, atraumatic Neck: no adenopathy, supple, symmetrical, trachea midline and thyroid  normal to inspection and palpation Lungs: clear to auscultation bilaterally Breasts: right breast absent, no axillary  adenopathy.  Left breast - normal appearance, no masses or tenderness, No nipple retraction or dimpling, No nipple discharge or bleeding, No axillary adenopathy Heart: regular rate and  rhythm Abdomen: soft, non-tender; no masses, no organomegaly Extremities: extremities normal, atraumatic, no cyanosis or edema Skin: skin color, texture, turgor normal. No rashes or lesions Lymph nodes: cervical, supraclavicular, and axillary nodes normal. Neurologic: grossly normal  Pelvic: External genitalia:  no lesions              No abnormal inguinal nodes palpated.              Urethra:  normal appearing urethra with no masses, tenderness or lesions              Bartholins and Skenes: normal                 Vagina: normal appearing vagina with normal color and discharge, no lesions              Cervix:  pale coloration of the os with potential thickening. Mucus also noted.               Pap taken: yes Bimanual Exam:  Uterus:  normal size, contour, position, consistency, mobility, non-tender              Adnexa: no mass, fullness, tenderness              Rectal exam: yes.  Confirms.              Anus:  normal sphincter tone, no lesions  Chaperone was present for exam:  Dereck BROCKS, CMA  ASSESSMENT: Encounter for breast and pelvic exam.  Personal history of other medical treatment and personal history of other specified risk factors.  Status post oophorectomy.  Hx HSV II.   Osteopenia. PCP following. On Evista .  Personal and FH breast cancer.  MGM and sister.  Personal negative genetic testing with VUS noted.  Patient is status post right mastectomy.  No radiation and no chemotherapy.  On Evista . Cervical abnormality.   Cervical cancer treatment.   PLAN: Mammogram screening discussed. Self breast awareness reviewed. Pap and reflex HRV collected:  yes Guidelines for Calcium, Vitamin D , regular exercise program including cardiovascular and weight bearing exercise. Medication refills:  Valtrex .   Vaginal Vitamin E suppostories.   Bone density in November, 2026.  Follow up:  yearly and prn.     Additional counseling given.  yes. 30 min  total time was spent for this patient encounter, including preparation, face-to-face counseling with the patient, coordination of care, and documentation of the encounter in addition to doing the breast and pelvic exam.

## 2023-08-30 NOTE — Patient Instructions (Signed)

## 2023-09-01 DIAGNOSIS — M8589 Other specified disorders of bone density and structure, multiple sites: Secondary | ICD-10-CM | POA: Diagnosis not present

## 2023-09-01 DIAGNOSIS — Z853 Personal history of malignant neoplasm of breast: Secondary | ICD-10-CM | POA: Diagnosis not present

## 2023-09-01 DIAGNOSIS — Z0000 Encounter for general adult medical examination without abnormal findings: Secondary | ICD-10-CM | POA: Diagnosis not present

## 2023-09-01 DIAGNOSIS — E785 Hyperlipidemia, unspecified: Secondary | ICD-10-CM | POA: Diagnosis not present

## 2023-09-01 DIAGNOSIS — I10 Essential (primary) hypertension: Secondary | ICD-10-CM | POA: Diagnosis not present

## 2023-09-01 DIAGNOSIS — Z23 Encounter for immunization: Secondary | ICD-10-CM | POA: Diagnosis not present

## 2023-09-01 DIAGNOSIS — D649 Anemia, unspecified: Secondary | ICD-10-CM | POA: Diagnosis not present

## 2023-09-01 DIAGNOSIS — N3281 Overactive bladder: Secondary | ICD-10-CM | POA: Diagnosis not present

## 2023-09-01 DIAGNOSIS — Z79899 Other long term (current) drug therapy: Secondary | ICD-10-CM | POA: Diagnosis not present

## 2023-09-04 LAB — CYTOLOGY - PAP: Diagnosis: NEGATIVE

## 2023-09-08 ENCOUNTER — Ambulatory Visit: Payer: Self-pay | Admitting: Obstetrics and Gynecology

## 2023-09-28 DIAGNOSIS — M7742 Metatarsalgia, left foot: Secondary | ICD-10-CM | POA: Diagnosis not present

## 2023-09-28 DIAGNOSIS — M7741 Metatarsalgia, right foot: Secondary | ICD-10-CM | POA: Diagnosis not present

## 2023-09-28 DIAGNOSIS — H43813 Vitreous degeneration, bilateral: Secondary | ICD-10-CM | POA: Diagnosis not present

## 2023-10-03 NOTE — Telephone Encounter (Signed)
 Letter received from [facility], 08/30/23 pap undeliverable by US  mail.   Spoke with patient, results reviewed with patient per Dr. Nikki.   Routing to provider for final review. Patient is agreeable to disposition. Will close encounter.

## 2023-11-27 ENCOUNTER — Encounter: Payer: Self-pay | Admitting: Radiology

## 2023-12-06 ENCOUNTER — Other Ambulatory Visit: Payer: Self-pay | Admitting: Hematology

## 2024-02-12 ENCOUNTER — Ambulatory Visit

## 2024-02-12 ENCOUNTER — Encounter: Payer: Self-pay | Admitting: Podiatry

## 2024-02-12 ENCOUNTER — Ambulatory Visit: Admitting: Podiatry

## 2024-02-12 DIAGNOSIS — M722 Plantar fascial fibromatosis: Secondary | ICD-10-CM

## 2024-02-12 MED ORDER — TRIAMCINOLONE ACETONIDE 10 MG/ML IJ SUSP
10.0000 mg | Freq: Once | INTRAMUSCULAR | Status: AC
  Administered 2024-02-12: 10 mg via INTRA_ARTICULAR

## 2024-02-13 NOTE — Progress Notes (Signed)
 Subjective:   Patient ID: Maureen Figueroa, female   DOB: 74 y.o.   MRN: [id number]   HPI Patient presents she has a large nodule on the bottom of the arch that she has noted somewhat recently and if possible she would like to have it removed if we cannot get it better conservatively.  States it bothers her at different times and patient does not currently smoke likes to be active   Review of Systems  All other systems reviewed and are negative.       Objective:  Physical Exam Vitals and nursing note reviewed.  Constitutional:      Appearance: She is well-developed.  Pulmonary:     Effort: Pulmonary effort is normal.  Musculoskeletal:        General: Normal range of motion.  Skin:    General: Skin is warm.  Neurological:     Mental Status: She is alert.     Neurovascular status intact muscle strength found to be adequate range of motion adequate with nodule on the plantar left arch measuring 1.6 x 1.5 cm moderately tender when pressed and appears to possibly have some cystic involvement.  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  I am going to try first to shrink it with steroid injection which was administered today around the area itself and then I advised on heat therapy and if it does not reduce in size then we will gena have to consider excision or other treatment.  Patient agrees with this understanding risk  X-rays were negative for signs of calcification or bony condition

## 2024-02-27 ENCOUNTER — Inpatient Hospital Stay: Payer: Medicare PPO

## 2024-02-27 ENCOUNTER — Inpatient Hospital Stay: Payer: Medicare PPO | Admitting: Nurse Practitioner

## 2024-02-28 ENCOUNTER — Other Ambulatory Visit: Payer: Self-pay

## 2024-02-28 DIAGNOSIS — D0511 Intraductal carcinoma in situ of right breast: Secondary | ICD-10-CM

## 2024-02-28 NOTE — Assessment & Plan Note (Signed)
 intermediate grade, ER+/PR- and [facility] -S/p right mastectomy on 08/03/20 by Dr. Vernetta.  She did not require postmastectomy radiation -Genetics were negative -Postmastectomy radiation was not recommended  -She began raloxifene  on 11/25/2020, Plan for total of 5 years -L mammo 04/2022 and MRI 10/2022 were benign. Incidental aneurysm is being followed by vascular -Ms. Tisby is clinically doing well.  Tolerating raloxifene  with hot flashes, weight gain, and possible right hip pain.  We discussed she can trial holding raloxifene  for 2-3 weeks to see if the hip pain improves.  If it does not, this is likely arthritis.  Will message me if she would like to try this, otherwise we will continue -Labs reviewed.  Exam is benign.  Overall no clinical concern for recurrence -Discussed the use of contrast-enhanced mammography to replace MRIs, she is interested.  Will proceed with [facility] in 04/2023 -Follow-up in 1 year, or sooner if needed

## 2024-02-28 NOTE — Progress Notes (Unsigned)
 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 Vernetta.  She did not require postmastectomy radiation -Genetics were negative -Postmastectomy radiation was not recommended  -She began raloxifene  on 11/25/2020, Plan for total of 5 years -L mammo 04/2022 and MRI 10/2022 were benign. Incidental aneurysm is being followed by vascular -Ms. Wilbert is clinically doing well.  Tolerating raloxifene  with hot flashes, weight gain, and possible right hip pain.  We discussed she can trial holding raloxifene  for 2-3 weeks to see if the hip pain improves.  If it does not, this is likely arthritis.  Will message me if she would like to try this, otherwise we will continue -Labs reviewed.  Exam is benign.  Overall no clinical concern for recurrence -Discussed the use of contrast-enhanced mammography to replace MRIs, she is interested.  Will proceed with [facility] in 04/2023 -Follow-up in 1 year, or sooner if needed   Bone health DEXA san done 12/13/2022. She is considered to be osteopenic. FRAX 10 year probability of major osteoporotic fracture is 19% and hip fracture is 3.7%. she takes Evista  every day. She is physically active.    The patient understands the plans discussed today and is in agreement with them.  She knows to contact our office if she develops concerns prior to her next  appointment.  I provided *** minutes of face-to-face time during this encounter and > 50% was spent counseling as documented under my assessment and plan.    Powell FORBES Lessen, NP  [facility] - A DEPT OF [facility] [address] [place] [address] Dept: [phone] Dept Fax: [phone]   No orders of the defined types were placed in this encounter.     CHIEF COMPLAINT:  CC: DCIS of right breast  Current Treatment: Raloxifene  started 11/25/2020  INTERVAL HISTORY:  Arbell is here today for repeat clinical assessment. She last saw Lacie, NP on 02/28/2023. She had 3D screening mammogram done 05/24/2023. She has breast density Category C and results were benign. She denies fevers or chills. She denies pain. Her appetite is good. Her weight {Weight change:10426}.  I have reviewed the past medical history, past surgical history, social history and family history with the patient and they are unchanged from previous note.  ALLERGIES:  is allergic to metaxalone, chocolate, cocoa, and penicillins.  MEDICATIONS:  Current Outpatient Medications  Medication Sig Dispense Refill   acetaminophen  (TYLENOL ) 500 MG tablet Take 1,000 mg by mouth every 6 (six) hours as needed for moderate pain (pain score 4-6) or headache.     Ascorbic Acid (VITAMIN C ) 1000 MG tablet Take 2,000 mg by mouth once a week.     Calcium Carb-Cholecalciferol (CALCIUM 600 + D PO) Take 1 tablet by mouth once a week.     carisoprodol  (SOMA ) 350 MG tablet Take 350 mg by mouth daily as needed for muscle spasms.  cholecalciferol (VITAMIN D3) 25 MCG (1000 UNIT) tablet Take 2,000-3,000 Units by mouth once a week.     meloxicam (MOBIC) 15 MG tablet Take 1 tablet by mouth daily.     Multiple Vitamin (MULTI-VITAMIN PO) Take 1 tablet by mouth once a week.     NONFORMULARY OR COMPOUNDED ITEM Vitamin E 200 u/ml vaginal suppositories.  Place one per vaginal at hs  twice weekly.  [facility]. 24 each 3   raloxifene  (EVISTA ) 60 MG tablet TAKE 1 TABLET(60 MG) BY MOUTH DAILY 90 tablet 3   selenium sulfide (SELSUN) 2.5 % shampoo Apply 1 Application topically daily as needed for itching.     SUPER B COMPLEX/C PO Take 1 tablet by mouth once a week.     tretinoin  microspheres (RETIN-A  MICRO) 0.1 % gel Apply 1 application  topically at bedtime as needed (acne).     trolamine salicylate (ASPERCREME) 10 % cream      valACYclovir  (VALTREX ) 1000 MG tablet TAKE 1 TABLET BY MOUTH AT BEDTIME. TAKE 1 TABLET TWICE DAILY X 3 DAYS FOR GENITAL OUTBREAK AND 2 TABS EVERY 12 HOURS X 1 DAY FOR ORAL OUTBREAK 110 tablet 3   valsartan (DIOVAN) 80 MG tablet Take 160 mg by mouth daily. Pt takes 80 mg in am and 80 mg in pm     No current facility-administered medications for this visit.    HISTORY OF PRESENT ILLNESS:   Oncology History Overview Note  Cancer Staging Ductal carcinoma in situ (DCIS) of right breast Staging form: Breast, AJCC 8th Edition - Clinical stage from 06/03/2020: Stage 0 (cTis (DCIS), cN0, cM0, ER+, PR+, HER2: Not Assessed) - Unsigned Stage prefix: Initial diagnosis Nuclear grade: G2 Laterality: Right Staged by: Pathologist and managing physician Stage used in treatment planning: Yes National guidelines used in treatment planning: Yes Type of national guideline used in treatment planning: NCCN - Pathologic stage from 08/03/2020: Stage Unknown (pTis (DCIS), pNX, cM0, G2, ER+, PR-, HER2: Not Assessed) - Signed by Burton, Lacie K, NP on 11/25/2020 Histologic grading system: 3 grade system    Ductal carcinoma in situ (DCIS) of right breast  05/13/2020 Mammogram   Impression: The 1.8cm grouped amophous calcifications in teh right breasr upper outer aspect middle depth 7cmfn are suspicious. A biopsy is recommended.    05/26/2020 Initial Biopsy   Diagnosis Breast, right, nmededle core biopsy, 12:00, 7cmfn -DUCTAL CARCINOMA IN SITU WITH CALCIFICATIONS.   The DCIS has intermediate nuclear grade.     05/29/2020 Initial Diagnosis   Ductal carcinoma in situ (DCIS) of right breast   06/14/2020 Genetic Testing   Negative genetic testing:  No pathogenic variants detected on the [employer] BRCAplus panel and CancerNext-Expanded + RNAinsight panel. The report dates are 06/14/2020 and 07/02/2020, respectively. A variant of uncertain significance (VUS) was detected in the DICER1 gene called p.S299L (c.896C>T).  The BRCAplus panel offered by [employer] and includes sequencing and deletion/duplication analysis for the following 8 genes: ATM, BRCA1, BRCA2, CDH1, CHEK2, PALB2, PTEN, and TP53. The CancerNext-Expanded + RNAinsight gene panel offered by [employer] and includes sequencing and rearrangement analysis for the following 77 genes: AIP, ALK, APC, ATM, AXIN2, BAP1, BARD1, BLM, BMPR1A, BRCA1, BRCA2, BRIP1, CDC73, CDH1, CDK4, CDKN1B, CDKN2A, CHEK2, CTNNA1, DICER1, FANCC, FH, FLCN, GALNT12, KIF1B, LZTR1, MAX, MEN1, MET, MLH1, MSH2, MSH3, MSH6, MUTYH, NBN, NF1, NF2, NTHL1, PALB2, PHOX2B, PMS2, POT1, PRKAR1A, PTCH1, PTEN, RAD51C, RAD51D, RB1, RECQL, RET, SDHA, SDHAF2, SDHB, SDHC, SDHD, SMAD4, SMARCA4, SMARCB1, SMARCE1, STK11, SUFU, TMEM127, TP53, TSC1,  TSC2, VHL and XRCC2 (sequencing and deletion/duplication); EGFR, EGLN1, HOXB13, KIT, MITF, PDGFRA, POLD1 and POLE (sequencing only); EPCAM and GREM1 (deletion/duplication only). RNA data is routinely analyzed for use in variant interpretation for all genes.   06/15/2020 Imaging   Bilateral Breast MRI  IMPRESSION: 1. There is ill-defined non mass enhancement in the central right breast at 12 o'clock spanning up to 4.5 cm. Review of the outside mammogram demonstrates a group of calcifications anterior to the recently biopsied DCIS and another group slightly inferior and lateral to the recently diagnosed DCIS. Taking these 2 additional groups of calcifications into account, the total span of calcifications is 5.1 cm  likely correlating with the non mass enhancement seen today. The non mass enhancement today and the additional groups of calcifications could also represent DCIS. 2. No other abnormalities in either breast.   07/01/2020 Pathology Results   Diagnosis 1. Breast, right, needle core biopsy - INTERMEDIATE TO HIGH-GRADE DUCTAL CARCINOMA IN SITU WITH FOCAL NECROSIS AND CALCIFICATIONS. SEE NOTE 2. Breast, right, needle core biopsy - ATYPICAL LOBULAR HYPERPLASIA. SEE NOTE - FIBROCYSTIC CHANGE WITH CALCIFICATIONS  1. PROGNOSTIC INDICATORS Results: IMMUNOHISTOCHEMICAL AND MORPHOMETRIC ANALYSIS PERFORMED MANUALLY Estrogen Receptor: 95%, POSITIVE, STRONG STAINING INTENSITY Progesterone  Receptor: 0%, NEGATIVE   08/03/2020 Pathology Results   FINAL MICROSCOPIC DIAGNOSIS:   A. BREAST, RIGHT, MASTECTOMY:  -  Ductal carcinoma in situ, intermediate grade, 0.9 cm  -  Margins uninvolved by carcinoma (2.0; posterior margin)  -  Lobular neoplasia (atypical lobular hyperplasia)  -  Fibrocystic changes  -  Calcifications associated with carcinoma and benign breast tissue  -  Previous biopsy site changes    08/03/2020 Cancer Staging   Staging form: Breast, AJCC 8th Edition - Pathologic stage from 08/03/2020: Stage Unknown (pTis (DCIS), pNX, cM0, G2, ER+, PR-, HER2: Not Assessed) - Signed by Ann Mayme POUR, NP on 11/25/2020 Histologic grading system: 3 grade system   11/25/2020 Survivorship   SCP delivered by Lacie Burton, NP   11/25/2020 -  Anti-estrogen oral therapy   Begin Raloxifene  60 mg po once daily, goal 5 years       REVIEW OF SYSTEMS:   Constitutional: Denies fevers, chills or abnormal weight loss Eyes: Denies blurriness of vision Ears, nose, mouth, throat, and face: Denies mucositis or sore throat Respiratory: Denies cough, dyspnea or wheezes Cardiovascular: Denies palpitation, chest discomfort or lower extremity swelling Gastrointestinal:  Denies nausea, heartburn or change in bowel  habits Skin: Denies abnormal skin rashes Lymphatics: Denies new lymphadenopathy or easy bruising Neurological:Denies numbness, tingling or new weaknesses Behavioral/Psych: Mood is stable, no new changes  All other systems were reviewed with the patient and are negative.   VITALS:  Blood pressure 137/66, temperature (!) 97.2 F (36.2 C), resp. rate 17, weight 174 lb 1.6 oz (79 kg), last menstrual period 09/24/2005, SpO2 97%.  Wt Readings from Last 3 Encounters:  02/29/24 174 lb 1.6 oz (79 kg)  08/30/23 168 lb 6.4 oz (76.4 kg)  06/21/23 160 lb (72.6 kg)    Body mass index is 28.1 kg/m.  Performance status (ECOG): {CHL ONC [id number]  PHYSICAL EXAM:   GENERAL:alert, no distress and comfortable SKIN: skin color, texture, turgor are normal, no rashes or significant lesions EYES: normal, Conjunctiva are pink and non-injected, sclera clear OROPHARYNX:no exudate, no erythema and lips, buccal mucosa, and tongue normal  NECK: supple, thyroid  normal size, non-tender, without nodularity LYMPH:  no palpable lymphadenopathy in the cervical, axillary or inguinal LUNGS: clear to auscultation and percussion with normal  breathing effort HEART: regular rate & rhythm and no murmurs and no lower extremity edema ABDOMEN:abdomen soft, non-tender and normal bowel sounds Musculoskeletal:no cyanosis of digits and no clubbing  NEURO: alert & oriented x 3 with fluent speech, no focal motor/sensory deficits  LABORATORY DATA:  I have reviewed the data as listed    Component Value Date/Time   NA 134 (L) 06/22/2023 0322   K 3.4 (L) 06/22/2023 0322   CL 105 06/22/2023 0322   CO2 26 06/22/2023 0322   GLUCOSE 106 (H) 06/22/2023 0322   BUN 11 06/22/2023 0322   CREATININE 0.49 06/22/2023 0322   CREATININE 0.56 02/28/2023 1106   CALCIUM 7.3 (L) 06/22/2023 0322   PROT 6.3 (L) 02/28/2023 1106   ALBUMIN 4.3 02/28/2023 1106   AST 11 (L) 02/28/2023 1106   ALT 16 02/28/2023 1106   ALKPHOS 53 02/28/2023  1106   BILITOT 1.0 02/28/2023 1106   GFRNONAA >60 06/22/2023 0322   GFRNONAA >60 02/28/2023 1106   GFRAA >90 01/01/2014 0521    No results found for: SPEP, UPEP  Lab Results  Component Value Date   WBC 4.2 02/29/2024   NEUTROABS 2.3 02/29/2024   HGB 13.8 02/29/2024   HCT 40.4 02/29/2024   MCV 98.8 02/29/2024   PLT 221 02/29/2024      Chemistry      Component Value Date/Time   NA 134 (L) 06/22/2023 0322   K 3.4 (L) 06/22/2023 0322   CL 105 06/22/2023 0322   CO2 26 06/22/2023 0322   BUN 11 06/22/2023 0322   CREATININE 0.49 06/22/2023 0322   CREATININE 0.56 02/28/2023 1106      Component Value Date/Time   CALCIUM 7.3 (L) 06/22/2023 0322   ALKPHOS 53 02/28/2023 1106   AST 11 (L) 02/28/2023 1106   ALT 16 02/28/2023 1106   BILITOT 1.0 02/28/2023 1106       RADIOGRAPHIC STUDIES: I have personally reviewed the radiological images as listed and agreed with the findings in the report. DG Foot 2 Views Left Result Date: 02/12/2024 Please see detailed radiograph report in office note.  "

## 2024-02-29 ENCOUNTER — Inpatient Hospital Stay

## 2024-02-29 ENCOUNTER — Inpatient Hospital Stay: Admitting: Nurse Practitioner

## 2024-02-29 VITALS — BP 137/66 | Temp 97.2°F | Resp 17 | Wt 174.1 lb

## 2024-02-29 DIAGNOSIS — D0511 Intraductal carcinoma in situ of right breast: Secondary | ICD-10-CM

## 2024-02-29 LAB — CMP (CANCER CENTER ONLY)
ALT: 13 U/L (ref 0–44)
AST: 12 U/L — ABNORMAL LOW (ref 15–41)
Albumin: 4.5 g/dL (ref 3.5–5.0)
Alkaline Phosphatase: 68 U/L (ref 38–126)
Anion gap: 10 (ref 5–15)
BUN: 16 mg/dL (ref 8–23)
CO2: 26 mmol/L (ref 22–32)
Calcium: 9.2 mg/dL (ref 8.9–10.3)
Chloride: 103 mmol/L (ref 98–111)
Creatinine: 0.56 mg/dL (ref 0.44–1.00)
GFR, Estimated: >60 mL/min
Glucose, Bld: 111 mg/dL — ABNORMAL HIGH (ref 70–99)
Potassium: 4.3 mmol/L (ref 3.5–5.1)
Sodium: 139 mmol/L (ref 135–145)
Total Bilirubin: 0.9 mg/dL (ref 0.0–1.2)
Total Protein: 6.7 g/dL (ref 6.5–8.1)

## 2024-02-29 LAB — CBC WITH DIFFERENTIAL (CANCER CENTER ONLY)
Abs Immature Granulocytes: 0.02 10*3/uL (ref 0.00–0.07)
Basophils Absolute: 0 10*3/uL (ref 0.0–0.1)
Basophils Relative: 1 %
Eosinophils Absolute: 0.2 10*3/uL (ref 0.0–0.5)
Eosinophils Relative: 4 %
HCT: 40.4 % (ref 36.0–46.0)
Hemoglobin: 13.8 g/dL (ref 12.0–15.0)
Immature Granulocytes: 1 %
Lymphocytes Relative: 34 %
Lymphs Abs: 1.4 10*3/uL (ref 0.7–4.0)
MCH: 33.7 pg (ref 26.0–34.0)
MCHC: 34.2 g/dL (ref 30.0–36.0)
MCV: 98.8 fL (ref 80.0–100.0)
Monocytes Absolute: 0.3 10*3/uL (ref 0.1–1.0)
Monocytes Relative: 8 %
Neutro Abs: 2.3 10*3/uL (ref 1.7–7.7)
Neutrophils Relative %: 52 %
Platelet Count: 221 10*3/uL (ref 150–400)
RBC: 4.09 MIL/uL (ref 3.87–5.11)
RDW: 12.9 % (ref 11.5–15.5)
WBC Count: 4.2 10*3/uL (ref 4.0–10.5)
nRBC: 0 % (ref 0.0–0.2)

## 2024-03-04 ENCOUNTER — Ambulatory Visit: Admitting: Podiatry

## 2024-09-02 ENCOUNTER — Encounter: Admitting: Obstetrics and Gynecology
